# Patient Record
Sex: Female | Born: 1965 | Race: Black or African American | Hispanic: No | Smoking: Current some day smoker
Health system: Southern US, Community
[De-identification: ages and names within clinical notes are randomized; demographics above are authoritative.]

## PROBLEM LIST (undated history)

## (undated) ENCOUNTER — Ambulatory Visit: Payer: Self-pay

## (undated) DIAGNOSIS — Z9289 Personal history of other medical treatment: Secondary | ICD-10-CM

## (undated) DIAGNOSIS — N938 Other specified abnormal uterine and vaginal bleeding: Secondary | ICD-10-CM

## (undated) DIAGNOSIS — I10 Essential (primary) hypertension: Secondary | ICD-10-CM

## (undated) DIAGNOSIS — K219 Gastro-esophageal reflux disease without esophagitis: Secondary | ICD-10-CM

## (undated) DIAGNOSIS — N361 Urethral diverticulum: Secondary | ICD-10-CM

## (undated) DIAGNOSIS — B3731 Acute candidiasis of vulva and vagina: Secondary | ICD-10-CM

## (undated) DIAGNOSIS — J189 Pneumonia, unspecified organism: Secondary | ICD-10-CM

## (undated) DIAGNOSIS — B373 Candidiasis of vulva and vagina: Secondary | ICD-10-CM

## (undated) DIAGNOSIS — J9801 Acute bronchospasm: Secondary | ICD-10-CM

## (undated) DIAGNOSIS — N390 Urinary tract infection, site not specified: Secondary | ICD-10-CM

## (undated) HISTORY — PX: URETHRAL DIVERTICULECTOMY: SHX2618

## (undated) HISTORY — DX: Acute candidiasis of vulva and vagina: B37.31

## (undated) HISTORY — DX: Acute bronchospasm: J98.01

## (undated) HISTORY — PX: TUBAL LIGATION: SHX77

## (undated) HISTORY — DX: Urethral diverticulum: N36.1

## (undated) HISTORY — DX: Urinary tract infection, site not specified: N39.0

## (undated) HISTORY — PX: LASER ABLATION: SHX1947

## (undated) HISTORY — DX: Candidiasis of vulva and vagina: B37.3

## (undated) HISTORY — DX: Other specified abnormal uterine and vaginal bleeding: N93.8

---

## 2000-08-30 ENCOUNTER — Encounter: Admission: RE | Admit: 2000-08-30 | Discharge: 2000-08-30 | Payer: Self-pay | Admitting: Occupational Medicine

## 2000-08-30 ENCOUNTER — Encounter: Payer: Self-pay | Admitting: Occupational Medicine

## 2001-06-12 ENCOUNTER — Encounter: Payer: Self-pay | Admitting: Internal Medicine

## 2001-06-12 ENCOUNTER — Encounter: Admission: RE | Admit: 2001-06-12 | Discharge: 2001-06-12 | Payer: Self-pay | Admitting: Internal Medicine

## 2001-10-14 ENCOUNTER — Encounter: Payer: Self-pay | Admitting: Pulmonary Disease

## 2002-07-27 ENCOUNTER — Emergency Department (HOSPITAL_COMMUNITY): Admission: EM | Admit: 2002-07-27 | Discharge: 2002-07-28 | Payer: Self-pay | Admitting: Emergency Medicine

## 2002-07-28 ENCOUNTER — Encounter: Payer: Self-pay | Admitting: Emergency Medicine

## 2004-06-20 ENCOUNTER — Ambulatory Visit: Payer: Self-pay | Admitting: Internal Medicine

## 2005-02-27 ENCOUNTER — Ambulatory Visit: Payer: Self-pay | Admitting: Internal Medicine

## 2005-03-23 ENCOUNTER — Ambulatory Visit: Payer: Self-pay | Admitting: Internal Medicine

## 2005-03-27 ENCOUNTER — Ambulatory Visit: Payer: Self-pay | Admitting: Internal Medicine

## 2005-03-31 ENCOUNTER — Ambulatory Visit: Payer: Self-pay | Admitting: Internal Medicine

## 2005-04-28 ENCOUNTER — Other Ambulatory Visit: Admission: RE | Admit: 2005-04-28 | Discharge: 2005-04-28 | Payer: Self-pay | Admitting: Obstetrics & Gynecology

## 2005-05-12 ENCOUNTER — Emergency Department (HOSPITAL_COMMUNITY): Admission: EM | Admit: 2005-05-12 | Discharge: 2005-05-12 | Payer: Self-pay | Admitting: Family Medicine

## 2006-04-18 ENCOUNTER — Emergency Department (HOSPITAL_COMMUNITY): Admission: EM | Admit: 2006-04-18 | Discharge: 2006-04-18 | Payer: Self-pay | Admitting: Family Medicine

## 2006-07-12 ENCOUNTER — Ambulatory Visit: Payer: Self-pay | Admitting: Internal Medicine

## 2006-07-12 LAB — CONVERTED CEMR LAB
Bilirubin Urine: NEGATIVE
Hemoglobin, Urine: NEGATIVE
Nitrite: NEGATIVE
RBC / HPF: NONE SEEN (ref <3)
Urine Glucose: NEGATIVE mg/dL
pH: 7 (ref 5.0–8.0)

## 2008-12-14 ENCOUNTER — Emergency Department (HOSPITAL_COMMUNITY): Admission: EM | Admit: 2008-12-14 | Discharge: 2008-12-14 | Payer: Self-pay | Admitting: Emergency Medicine

## 2008-12-16 ENCOUNTER — Emergency Department (HOSPITAL_COMMUNITY): Admission: EM | Admit: 2008-12-16 | Discharge: 2008-12-16 | Payer: Self-pay | Admitting: Family Medicine

## 2009-04-24 DIAGNOSIS — Z9289 Personal history of other medical treatment: Secondary | ICD-10-CM

## 2009-04-24 HISTORY — DX: Personal history of other medical treatment: Z92.89

## 2009-10-12 ENCOUNTER — Encounter: Payer: Self-pay | Admitting: Internal Medicine

## 2009-10-12 ENCOUNTER — Inpatient Hospital Stay (HOSPITAL_COMMUNITY): Admission: EM | Admit: 2009-10-12 | Discharge: 2009-10-13 | Payer: Self-pay | Admitting: Emergency Medicine

## 2009-10-12 ENCOUNTER — Ambulatory Visit: Payer: Self-pay | Admitting: Internal Medicine

## 2009-10-13 ENCOUNTER — Encounter: Payer: Self-pay | Admitting: Internal Medicine

## 2009-10-13 DIAGNOSIS — I10 Essential (primary) hypertension: Secondary | ICD-10-CM

## 2009-10-13 DIAGNOSIS — N390 Urinary tract infection, site not specified: Secondary | ICD-10-CM

## 2009-10-13 DIAGNOSIS — N949 Unspecified condition associated with female genital organs and menstrual cycle: Secondary | ICD-10-CM | POA: Insufficient documentation

## 2009-10-13 DIAGNOSIS — D259 Leiomyoma of uterus, unspecified: Secondary | ICD-10-CM | POA: Insufficient documentation

## 2009-10-13 DIAGNOSIS — D509 Iron deficiency anemia, unspecified: Secondary | ICD-10-CM | POA: Insufficient documentation

## 2009-10-13 HISTORY — DX: Urinary tract infection, site not specified: N39.0

## 2009-10-26 ENCOUNTER — Ambulatory Visit (HOSPITAL_COMMUNITY): Admission: RE | Admit: 2009-10-26 | Discharge: 2009-10-26 | Payer: Self-pay | Admitting: Urology

## 2009-10-27 ENCOUNTER — Ambulatory Visit: Payer: Self-pay | Admitting: Obstetrics and Gynecology

## 2009-11-26 ENCOUNTER — Other Ambulatory Visit: Admission: RE | Admit: 2009-11-26 | Discharge: 2009-11-26 | Payer: Self-pay | Admitting: Obstetrics and Gynecology

## 2009-11-26 ENCOUNTER — Ambulatory Visit: Payer: Self-pay | Admitting: Obstetrics and Gynecology

## 2010-01-10 ENCOUNTER — Ambulatory Visit: Payer: Self-pay | Admitting: Obstetrics & Gynecology

## 2010-01-10 ENCOUNTER — Inpatient Hospital Stay (HOSPITAL_COMMUNITY): Admission: AD | Admit: 2010-01-10 | Discharge: 2010-01-11 | Payer: Self-pay | Admitting: Obstetrics & Gynecology

## 2010-03-01 ENCOUNTER — Ambulatory Visit: Payer: Self-pay | Admitting: Obstetrics & Gynecology

## 2010-03-01 ENCOUNTER — Observation Stay (HOSPITAL_COMMUNITY): Admission: AD | Admit: 2010-03-01 | Discharge: 2010-03-02 | Payer: Self-pay | Admitting: Obstetrics & Gynecology

## 2010-03-07 ENCOUNTER — Ambulatory Visit: Payer: Self-pay | Admitting: Obstetrics and Gynecology

## 2010-04-26 ENCOUNTER — Ambulatory Visit (HOSPITAL_COMMUNITY)
Admission: RE | Admit: 2010-04-26 | Discharge: 2010-04-26 | Payer: Self-pay | Source: Home / Self Care | Attending: Obstetrics & Gynecology | Admitting: Obstetrics & Gynecology

## 2010-05-11 ENCOUNTER — Ambulatory Visit: Admit: 2010-05-11 | Payer: Self-pay | Admitting: Obstetrics & Gynecology

## 2010-05-16 NOTE — Op Note (Addendum)
  NAME:  [name], Kristin NO.:  [id number]  MEDICAL RECORD NO.:  [id number]          PATIENT TYPE:  AMB  LOCATION:  SDC                           FACILITY:  [facility]  PHYSICIAN:  Scheryl Darter, MD       DATE OF BIRTH:  12/31/1965  DATE OF PROCEDURE:  04/26/2010 DATE OF DISCHARGE:                              OPERATIVE REPORT   PROCEDURES: 1. Hysteroscopy curettage. 2. Endometrial ablation with NovaSure.  PREOPERATIVE DIAGNOSES: 1. Menorrhagia. 2. Submucous fibroid.  POSTOPERATIVE DIAGNOSES: 1. Menorrhagia. 2. Endometrial polyps.  SURGEON:  Scheryl Darter, MD  ANESTHESIA:  General plus local.  ESTIMATED BLOOD LOSS:  Minimal.  SPECIMEN:  Endometrial curettings.  COMPLICATIONS:  None.  DRAINS:  None.  COUNTS:  Correct.  OPERATIVE COURSE:  The patient gave written consent for hysteroscopy, possible resection of submucous fibroid, and endometrial ablation.  The patient identification was confirmed.  She was brought to the OR and general anesthesia was induced.  She was placed in dorsal lithotomy position.  Exam revealed appeared to be normal size uterus.  No adnexal masses.  Perineum and vagina were sterilely prepped and draped and bladder was drained with a red rubber catheter.  Speculum was inserted. A 0.5% Marcaine was infiltrated for intracervical block using 14 mL of anesthetic.  Cervix was grasped with single-tooth tenaculum.  Cervix was dilated enough to pass the diagnostic hysteroscope.  Endometrial cavity measured 10 cm.  Both tubal ostia were seen.  Endometrial polyps were identified but no fibroid and was seen.  Elected to proceed with curettage in order to obtain tissue specimen.  A 1.5% glycine was used during the hysteroscopy.  Endometrial curettings were obtained and sent to Pathology.  Then, proceeded with the endometrial ablation with NovaSure.  The cervical length was 5 cm giving a cavity length of 5 cm. The device was introduced and the  cavity width was 3.9 cm.  After the cavity assessment was passed, normal treatment cycle lasting 1 minute 15 seconds was performed without complications. Instruments were removed.  There was no bleeding at the end of procedure.  Estimated blood loss was negligible.  The patient tolerated the procedure well without complications.  She was brought in stable condition to the recovery room.     Scheryl Darter, MD     JA/MEDQ  D:  04/26/2010  T:  04/26/2010  Job:  [id number]  Electronically Signed by Scheryl Darter MD on 05/16/2010 11:44:16 AM

## 2010-05-26 NOTE — Discharge Summary (Signed)
Summary: Hospital Discharge Update    Hospital Discharge Update:  Date of Admission: 10/12/2009 Date of Discharge: 10/13/2009  Brief Summary:  Kristin Burnett presented after 2 weeks of extensive vaginal bleeding.  US showed fibroid.  Hb was 5.0 with MCV or 57.  She received 3 units of blood, with appropriate rise in Hb to 8.9.  She was given 3 doses of IV Premarin and was evaluated by Dr. Algie Coffer.  On exam she was found to have a urethral abscess.  She was stabilized and discharged on 1 month of megace with follow up with Dr. Algie Coffer.  She is to see urology regarding urethral abscess.  Lab or other results pending at discharge:  Hemoglobin electrophoresis  Other follow-up issues:  She is to followup with Urology regarding abscess.  Problem list changes:  Added new problem of LEIOMYOMA OF UTERUS, UNSPECIFIED (ICD-218.9) - Signed Added new problem of DYSFUNCTIONAL UTERINE BLEEDING (ICD-626.8) - Signed Added new problem of ANEMIA, HYPOCHROMIC (ICD-280.9) - Signed Added new problem of URETHRAL ABSCESS (ICD-597.0) - Signed Added new problem of ESSENTIAL HYPERTENSION, BENIGN (ICD-401.1) - Signed  Medication list changes:  Added new medication of KEFLEX 500 MG CAPS (CEPHALEXIN) Take one capsule by mouth four times a day - Signed Added new medication of MEGESTROL ACETATE 40 MG TABS (MEGESTROL ACETATE) Take 1 tablet by mouth two times a day - Signed Added new medication of OXYCODONE-ACETAMINOPHEN 5-325 MG TABS (OXYCODONE-ACETAMINOPHEN) Take 1 tab by mouth every 4-6 hours as needed for pain. - Signed Rx of KEFLEX 500 MG CAPS (CEPHALEXIN) Take one capsule by mouth four times a day;  #32 x 0;  Signed;  Entered by: Deatra Robinson MD;  Authorized by: Deatra Robinson MD;  Method used: Print then Give to Patient Rx of MEGESTROL ACETATE 40 MG TABS (MEGESTROL ACETATE) Take 1 tablet by mouth two times a day;  #60 x 0;  Signed;  Entered by: Deatra Robinson MD;  Authorized by: Deatra Robinson MD;  Method used:  Print then Give to Patient Rx of OXYCODONE-ACETAMINOPHEN 5-325 MG TABS (OXYCODONE-ACETAMINOPHEN) Take 1 tab by mouth every 4-6 hours as needed for pain.;  #60 x 0;  Signed;  Entered by: Deatra Robinson MD;  Authorized by: Deatra Robinson MD;  Method used: Print then Give to Patient  Discharge medications:  HYDROCHLOROTHIAZIDE 25 MG  TABS (HYDROCHLOROTHIAZIDE) TAKE ONE TABLET DAILY KEFLEX 500 MG CAPS (CEPHALEXIN) Take one capsule by mouth four times a day MEGESTROL ACETATE 40 MG TABS (MEGESTROL ACETATE) Take 1 tablet by mouth two times a day OXYCODONE-ACETAMINOPHEN 5-325 MG TABS (OXYCODONE-ACETAMINOPHEN) Take 1 tab by mouth every 4-6 hours as needed for pain.  Other patient instructions:  Please follow-up with Dr. Charlyn Minerva (Urology) at his clinic on the 2nd floor of [facility] on 10/15/09 at 12:30 pm [phone]). Please keep your follow-up appointment with Dr. Ernestina Penna at [facility] on 10/27/09 at 3:15 pm [phone]) Please keep your follow-up appointment with Dr. [name] in the [facility] on 11/09/09 at 1:30 pm ([phone]) Please take all medications as prescribed.  New medications include Megestrol two times a day and Keflex four times a day.   Please return to the doctor if you have return of extensive vaginal bleeding, dizziness, fainting, chest pain, shortness of breath, fevers or chills.    Note: Hospital Discharge Medications & Other Instructions handout was printed, one copy for patient and a second copy to be placed in hospital chart.

## 2010-07-04 LAB — CBC
MCHC: 29.6 g/dL — ABNORMAL LOW (ref 30.0–36.0)
Platelets: 279 10*3/uL (ref 150–400)
RBC: 4.56 MIL/uL (ref 3.87–5.11)
WBC: 10.4 10*3/uL (ref 4.0–10.5)

## 2010-07-04 LAB — BASIC METABOLIC PANEL
CO2: 24 mEq/L (ref 19–32)
Chloride: 104 mEq/L (ref 96–112)
GFR calc non Af Amer: 52 mL/min — ABNORMAL LOW (ref >60)
Potassium: 3.2 mEq/L — ABNORMAL LOW (ref 3.5–5.1)
Sodium: 134 mEq/L — ABNORMAL LOW (ref 135–145)

## 2010-07-04 LAB — HCG, SERUM, QUALITATIVE: Preg, Serum: NEGATIVE

## 2010-07-05 LAB — CROSSMATCH
Antibody Screen: POSITIVE
Unit division: 0

## 2010-07-05 LAB — CBC
HCT: 18.2 % — ABNORMAL LOW (ref 36.0–46.0)
Hemoglobin: 5.6 g/dL — CL (ref 12.0–15.0)
MCH: 25.7 pg — ABNORMAL LOW (ref 26.0–34.0)
MCV: 68.8 fL — ABNORMAL LOW (ref 78.0–100.0)
MCV: 78.8 fL (ref 78.0–100.0)
RBC: 2.65 MIL/uL — ABNORMAL LOW (ref 3.87–5.11)
RBC: 3.49 MIL/uL — ABNORMAL LOW (ref 3.87–5.11)
RDW: 21.3 % — ABNORMAL HIGH (ref 11.5–15.5)
RDW: 23.3 % — ABNORMAL HIGH (ref 11.5–15.5)
WBC: 15.4 10*3/uL — ABNORMAL HIGH (ref 4.0–10.5)

## 2010-07-05 LAB — URINE MICROSCOPIC-ADD ON: WBC, UA: NONE SEEN WBC/hpf (ref <3)

## 2010-07-05 LAB — URINALYSIS, ROUTINE W REFLEX MICROSCOPIC
Bilirubin Urine: NEGATIVE
Glucose, UA: NEGATIVE mg/dL
Nitrite: NEGATIVE
Protein, ur: NEGATIVE mg/dL
Urobilinogen, UA: 0.2 mg/dL (ref 0.0–1.0)

## 2010-07-05 LAB — COMPREHENSIVE METABOLIC PANEL
AST: 13 U/L (ref 0–37)
CO2: 24 mEq/L (ref 19–32)
Calcium: 9 mg/dL (ref 8.4–10.5)
Chloride: 110 mEq/L (ref 96–112)
Creatinine, Ser: 1.2 mg/dL (ref 0.4–1.2)
GFR calc non Af Amer: 49 mL/min — ABNORMAL LOW (ref >60)
Glucose, Bld: 103 mg/dL — ABNORMAL HIGH (ref 70–99)
Total Bilirubin: 0.7 mg/dL (ref 0.3–1.2)

## 2010-07-07 LAB — COMPREHENSIVE METABOLIC PANEL
ALT: 14 U/L (ref 0–35)
AST: 11 U/L (ref 0–37)
Chloride: 107 mEq/L (ref 96–112)
Creatinine, Ser: 1.25 mg/dL — ABNORMAL HIGH (ref 0.4–1.2)
GFR calc non Af Amer: 47 mL/min — ABNORMAL LOW (ref >60)
Sodium: 136 mEq/L (ref 135–145)
Total Bilirubin: 0.5 mg/dL (ref 0.3–1.2)

## 2010-07-07 LAB — GC/CHLAMYDIA PROBE AMP, GENITAL
Chlamydia, DNA Probe: NEGATIVE
GC Probe Amp, Genital: NEGATIVE

## 2010-07-07 LAB — RAPID URINE DRUG SCREEN, HOSP PERFORMED
Amphetamines: NOT DETECTED
Benzodiazepines: NOT DETECTED
Opiates: POSITIVE — AB

## 2010-07-07 LAB — URINE MICROSCOPIC-ADD ON

## 2010-07-07 LAB — WET PREP, GENITAL
Trich, Wet Prep: NONE SEEN
Yeast Wet Prep HPF POC: NONE SEEN

## 2010-07-07 LAB — DIFFERENTIAL
Basophils Absolute: 0.1 10*3/uL (ref 0.0–0.1)
Eosinophils Relative: 1 % (ref 0–5)
Lymphocytes Relative: 9 % — ABNORMAL LOW (ref 12–46)
Lymphs Abs: 1.4 10*3/uL (ref 0.7–4.0)
Neutrophils Relative %: 88 % — ABNORMAL HIGH (ref 43–77)

## 2010-07-07 LAB — URINALYSIS, ROUTINE W REFLEX MICROSCOPIC
Glucose, UA: NEGATIVE mg/dL
Protein, ur: 100 mg/dL — AB
Specific Gravity, Urine: >1.03 — ABNORMAL HIGH (ref 1.005–1.030)
Urobilinogen, UA: 0.2 mg/dL (ref 0.0–1.0)
pH: 6 (ref 5.0–8.0)

## 2010-07-07 LAB — CBC
MCHC: 32.5 g/dL (ref 30.0–36.0)
Platelets: 309 10*3/uL (ref 150–400)
RBC: 3.71 MIL/uL — ABNORMAL LOW (ref 3.87–5.11)
WBC: 15.8 10*3/uL — ABNORMAL HIGH (ref 4.0–10.5)

## 2010-07-07 LAB — SAMPLE TO BLOOD BANK

## 2010-07-10 LAB — CBC
HCT: 16.6 % — ABNORMAL LOW (ref 36.0–46.0)
Hemoglobin: 5 g/dL — CL (ref 12.0–15.0)
MCHC: 29.8 g/dL — ABNORMAL LOW (ref 30.0–36.0)
MCHC: 32.3 g/dL (ref 30.0–36.0)
MCV: 57.7 fL — ABNORMAL LOW (ref 78.0–100.0)
Platelets: 348 10*3/uL (ref 150–400)
RBC: 2.89 MIL/uL — ABNORMAL LOW (ref 3.87–5.11)
RBC: 4.01 MIL/uL (ref 3.87–5.11)
RDW: 23.6 % — ABNORMAL HIGH (ref 11.5–15.5)
WBC: 12.8 10*3/uL — ABNORMAL HIGH (ref 4.0–10.5)
WBC: 15.8 10*3/uL — ABNORMAL HIGH (ref 4.0–10.5)

## 2010-07-10 LAB — URINALYSIS, ROUTINE W REFLEX MICROSCOPIC
Bilirubin Urine: NEGATIVE
Glucose, UA: NEGATIVE mg/dL
Ketones, ur: NEGATIVE mg/dL
Specific Gravity, Urine: 1.029 (ref 1.005–1.030)

## 2010-07-10 LAB — THROMBIN TIME: Thrombin Time: 18 (ref 16–23)

## 2010-07-10 LAB — BASIC METABOLIC PANEL
BUN: 15 mg/dL (ref 6–23)
CO2: 21 mEq/L (ref 19–32)
CO2: 23 mEq/L (ref 19–32)
Calcium: 8.3 mg/dL — ABNORMAL LOW (ref 8.4–10.5)
Calcium: 9 mg/dL (ref 8.4–10.5)
Chloride: 107 mEq/L (ref 96–112)
Creatinine, Ser: 1.6 mg/dL — ABNORMAL HIGH (ref 0.4–1.2)
Creatinine, Ser: 1.61 mg/dL — ABNORMAL HIGH (ref 0.4–1.2)
GFR calc Af Amer: 43 mL/min — ABNORMAL LOW (ref >60)
GFR calc Af Amer: 43 mL/min — ABNORMAL LOW (ref >60)
GFR calc non Af Amer: 35 mL/min — ABNORMAL LOW (ref >60)
GFR calc non Af Amer: 36 mL/min — ABNORMAL LOW (ref >60)
Glucose, Bld: 95 mg/dL (ref 70–99)
Potassium: 3.9 mEq/L (ref 3.5–5.1)
Sodium: 135 mEq/L (ref 135–145)

## 2010-07-10 LAB — DIFFERENTIAL
Basophils Absolute: 0 10*3/uL (ref 0.0–0.1)
Basophils Relative: 0 % (ref 0–1)
Eosinophils Absolute: 0.1 10*3/uL (ref 0.0–0.7)
Eosinophils Relative: 1 % (ref 0–5)
Lymphocytes Relative: 18 % (ref 12–46)
Lymphs Abs: 2.3 10*3/uL (ref 0.7–4.0)
Monocytes Absolute: 0.9 10*3/uL (ref 0.1–1.0)
Monocytes Relative: 7 % (ref 3–12)
Neutro Abs: 9.5 10*3/uL — ABNORMAL HIGH (ref 1.7–7.7)
Neutrophils Relative %: 74 % (ref 43–77)

## 2010-07-10 LAB — TYPE AND SCREEN
ABO/RH(D): B POS
Antibody Screen: NEGATIVE

## 2010-07-10 LAB — HEMOGLOBINOPATHY EVALUATION
Hemoglobin Other: 0 % (ref 0.0–0.0)
Hgb A2 Quant: 2.5 % (ref 2.2–3.2)
Hgb F Quant: 0.3 % (ref 0.0–2.0)
Hgb S Quant: 0 % (ref 0.0–0.0)

## 2010-07-10 LAB — PLATELET FUNCTION ASSAY: Collagen / Epinephrine: 151 seconds (ref 0–184)

## 2010-07-10 LAB — FOLATE: Folate: 6.2 ng/mL

## 2010-07-10 LAB — TSH: TSH: 0.789 u[IU]/mL (ref 0.350–4.500)

## 2010-07-10 LAB — URINE CULTURE: Colony Count: >=100000

## 2010-07-10 LAB — VITAMIN B12: Vitamin B-12: 756 pg/mL (ref 211–911)

## 2010-07-10 LAB — RETICULOCYTES: Retic Count, Absolute: 36.6 10*3/uL (ref 19.0–186.0)

## 2010-07-10 LAB — URINE MICROSCOPIC-ADD ON

## 2010-07-10 LAB — POCT PREGNANCY, URINE: Preg Test, Ur: NEGATIVE

## 2011-04-03 ENCOUNTER — Encounter: Payer: Self-pay | Admitting: Emergency Medicine

## 2011-04-03 ENCOUNTER — Emergency Department (HOSPITAL_COMMUNITY): Payer: BC Managed Care – PPO

## 2011-04-03 ENCOUNTER — Emergency Department (HOSPITAL_COMMUNITY)
Admission: EM | Admit: 2011-04-03 | Discharge: 2011-04-04 | Disposition: A | Discharge status: Home / Self Care | Payer: BC Managed Care – PPO | Attending: Emergency Medicine | Admitting: Emergency Medicine

## 2011-04-03 DIAGNOSIS — I10 Essential (primary) hypertension: Secondary | ICD-10-CM | POA: Insufficient documentation

## 2011-04-03 DIAGNOSIS — Z79899 Other long term (current) drug therapy: Secondary | ICD-10-CM | POA: Insufficient documentation

## 2011-04-03 DIAGNOSIS — S93401A Sprain of unspecified ligament of right ankle, initial encounter: Secondary | ICD-10-CM

## 2011-04-03 DIAGNOSIS — M25476 Effusion, unspecified foot: Secondary | ICD-10-CM | POA: Insufficient documentation

## 2011-04-03 DIAGNOSIS — S060X0A Concussion without loss of consciousness, initial encounter: Secondary | ICD-10-CM | POA: Insufficient documentation

## 2011-04-03 DIAGNOSIS — M25579 Pain in unspecified ankle and joints of unspecified foot: Secondary | ICD-10-CM | POA: Insufficient documentation

## 2011-04-03 DIAGNOSIS — M25473 Effusion, unspecified ankle: Secondary | ICD-10-CM | POA: Insufficient documentation

## 2011-04-03 DIAGNOSIS — S8990XA Unspecified injury of unspecified lower leg, initial encounter: Secondary | ICD-10-CM | POA: Insufficient documentation

## 2011-04-03 HISTORY — DX: Essential (primary) hypertension: I10

## 2011-04-03 NOTE — ED Notes (Signed)
PT. TWISTED RIGHT ANKLE AFTER STEPPING ON UNEVEN GROUND TODAY .

## 2011-04-04 MED ORDER — NAPROXEN 500 MG PO TABS: 500.0000 mg | ORAL_TABLET | Freq: Two times a day (BID) | ORAL | Status: DC

## 2011-04-04 MED ORDER — KETOROLAC TROMETHAMINE 60 MG/2ML IM SOLN
60.0000 mg | Freq: Once | INTRAMUSCULAR | Status: AC
  Administered 2011-04-04: 60 mg via INTRAMUSCULAR
  Filled 2011-04-04: qty 2

## 2011-04-04 NOTE — ED Notes (Signed)
Patient presents stating that earlier on her last stop with her bus, she over tread her right ankle.  Right ankle swollen +pulses  Moves toes

## 2011-04-04 NOTE — ED Provider Notes (Signed)
History     CSN: [id number] Arrival date & time: 04/03/2011  8:53 PM   First MD Initiated Contact with Patient 04/04/11 0051      Chief Complaint  Patient presents with  . Ankle Injury    (Consider location/radiation/quality/duration/timing/severity/associated sxs/prior treatment) HPI Comments: Patient is a 45 year old female who is employed as a city [employer]. She states that while she was walking off of the bus, down the steps she stepped onto uneven ground, turning her right ankle. This was acute in onset at 2:45 PM, she has had constant mild pain which is worse with ambulation and associated with swelling. She has used an ice pack and some over-the-counter pain medication without relief at home.  Patient is a 45 y.o. female presenting with lower extremity injury. The history is provided by the patient and a relative.  Ankle Injury    Past Medical History  Diagnosis Date  . Hypertension   . Anemia     Past Surgical History  Procedure Date  . Tubal ligation     No family history on file.  History  Substance Use Topics  . Smoking status: Current Everyday Smoker  . Smokeless tobacco: Not on file  . Alcohol Use: Yes    OB History    Grav Para Term Preterm Abortions TAB SAB Ect Mult Living                  Review of Systems  Gastrointestinal: Negative for nausea and vomiting.  Musculoskeletal: Positive for joint swelling.  Neurological: Negative for weakness and numbness.    Allergies  Review of patient's allergies indicates no known allergies.  Home Medications   Current Outpatient Rx  Name Route Sig Dispense Refill  . HYDROCHLOROTHIAZIDE 25 MG PO TABS Oral Take 25 mg by mouth daily.      Marland Kitchen NAPROXEN 500 MG PO TABS Oral Take 1 tablet (500 mg total) by mouth 2 (two) times daily with a meal. 30 tablet 0    BP 182/106  Pulse 69  Temp(Src) 98.5 F (36.9 C) (Oral)  Resp 20  SpO2 97%  LMP 03/13/2011  Physical Exam  Nursing note and vitals  reviewed. Constitutional: She appears well-developed and well-nourished. No distress.  HENT:  Head: Normocephalic and atraumatic.  Eyes: Conjunctivae are normal. No scleral icterus.  Cardiovascular: Normal rate, regular rhythm and intact distal pulses.   Pulmonary/Chest: Effort normal and breath sounds normal.  Musculoskeletal: She exhibits tenderness ( Right ankle tenderness laterally inferior to the lateral malleolus. No tenderness over the base of the fifth metatarsal, no medial ankle tenderness. Decreased range of motion secondary to pain). She exhibits no edema.        Normal pulses of the foot on the right  Neurological: She is alert.  Skin: Skin is warm and dry. No rash noted. She is not diaphoretic.    ED Course  Procedures (including critical care time)  Labs Reviewed - No data to display Dg Ankle Complete Right  04/03/2011  *RADIOLOGY REPORT*  Clinical Data: Right ankle pain and swelling after fall.  RIGHT ANKLE - COMPLETE 3+ VIEW  Comparison: None.  Findings: Diffuse soft tissue swelling.  The ankle mortis and talar dome appear intact. No evidence of acute fracture or subluxation. No focal bone lesions.  Bone matrix and cortex appear intact.  No abnormal radiopaque densities in the soft tissues.  Plantar calcaneal spur.  IMPRESSION: Soft tissue swelling.  No acute bony abnormalities suggested.  Original Report Authenticated By:  Marlon Pel, M.D.     1. Sprain of right ankle       MDM  X-rays reviewed with patient showing no signs of bony fracture. Patient does have some hypertension and has been counseled to take her medications including hydrochlorothiazide. Anti-inflammatories given including intramuscular Toradol in home with Naprosyn. Rice therapy initiated with Ace wrap, ice, elevation, anti-inflammatories.        Vida Roller, MD 04/04/11 [id number]

## 2011-11-22 ENCOUNTER — Encounter (HOSPITAL_COMMUNITY): Payer: Self-pay | Admitting: *Deleted

## 2011-11-22 ENCOUNTER — Inpatient Hospital Stay (HOSPITAL_COMMUNITY)
Admission: AD | Admit: 2011-11-22 | Discharge: 2011-11-22 | Disposition: A | Discharge status: Home / Self Care | Payer: BC Managed Care – PPO | Source: Ambulatory Visit | Attending: Emergency Medicine | Admitting: Emergency Medicine

## 2011-11-22 ENCOUNTER — Encounter: Payer: Self-pay | Admitting: *Deleted

## 2011-11-22 DIAGNOSIS — I10 Essential (primary) hypertension: Secondary | ICD-10-CM | POA: Insufficient documentation

## 2011-11-22 DIAGNOSIS — N949 Unspecified condition associated with female genital organs and menstrual cycle: Secondary | ICD-10-CM | POA: Insufficient documentation

## 2011-11-22 DIAGNOSIS — K5900 Constipation, unspecified: Secondary | ICD-10-CM | POA: Insufficient documentation

## 2011-11-22 DIAGNOSIS — N938 Other specified abnormal uterine and vaginal bleeding: Secondary | ICD-10-CM | POA: Insufficient documentation

## 2011-11-22 DIAGNOSIS — N939 Abnormal uterine and vaginal bleeding, unspecified: Secondary | ICD-10-CM

## 2011-11-22 DIAGNOSIS — R109 Unspecified abdominal pain: Secondary | ICD-10-CM | POA: Insufficient documentation

## 2011-11-22 LAB — URINALYSIS, ROUTINE W REFLEX MICROSCOPIC
Glucose, UA: NEGATIVE mg/dL
Protein, ur: 100 mg/dL — AB
pH: 5 (ref 5.0–8.0)

## 2011-11-22 LAB — CBC
HCT: 40.7 % (ref 36.0–46.0)
Platelets: 247 10*3/uL (ref 150–400)
RDW: 14.3 % (ref 11.5–15.5)
WBC: 13.6 10*3/uL — ABNORMAL HIGH (ref 4.0–10.5)

## 2011-11-22 LAB — COMPREHENSIVE METABOLIC PANEL
ALT: 11 U/L (ref 0–35)
AST: 10 U/L (ref 0–37)
Albumin: 3.6 g/dL (ref 3.5–5.2)
Alkaline Phosphatase: 115 U/L (ref 39–117)
BUN: 11 mg/dL (ref 6–23)
Chloride: 100 mEq/L (ref 96–112)
Potassium: 3.4 mEq/L — ABNORMAL LOW (ref 3.5–5.1)
Total Bilirubin: 0.4 mg/dL (ref 0.3–1.2)

## 2011-11-22 LAB — URINE MICROSCOPIC-ADD ON

## 2011-11-22 MED ORDER — IRBESARTAN 150 MG PO TABS
300.0000 mg | ORAL_TABLET | Freq: Once | ORAL | Status: AC
  Administered 2011-11-22: 300 mg via ORAL
  Filled 2011-11-22: qty 2

## 2011-11-22 MED ORDER — SENNA-DOCUSATE SODIUM 8.6-50 MG PO TABS: 1.0000 | ORAL_TABLET | Freq: Every day | ORAL | Status: DC

## 2011-11-22 NOTE — ED Provider Notes (Signed)
History     CSN: [id number]  Arrival date & time 11/22/11  0745   None     Chief Complaint  Patient presents with  . Vaginal Bleeding  . Abdominal Pain    (Consider location/radiation/quality/duration/timing/severity/associated sxs/prior treatment) The history is provided by the patient.    46 y/o female INAD c/o lower abdominal pain (non-acute onset 10/10 yesterday Now 3/10) constipation, and vaginal bleeding with passage of large clots onset at 5 AM today. Pt vomited 1x NBNB. Pt has used 1 pad in 3 hours. Had  Fibroid ablation in 1.5 years ago. Denies weakness or lightheaded sensation.   [facility], Pt has HTN and has not had her Rx in 3 days because of her work schedule. Pt denies CP, palpitations, SOB, weakness  Past Medical History  Diagnosis Date  . Hypertension   . Anemia     Past Surgical History  Procedure Date  . Tubal ligation   . Laser ablation     History reviewed. No pertinent family history.  History  Substance Use Topics  . Smoking status: Current Everyday Smoker  . Smokeless tobacco: Not on file  . Alcohol Use: Yes    OB History    Grav Para Term Preterm Abortions TAB SAB Ect Mult Living   3 3 3       3       Review of Systems  Allergies  Review of patient's allergies indicates no known allergies.  Home Medications   Current Outpatient Rx  Name Route Sig Dispense Refill  . TYLENOL EX ST ARTHRITIS PAIN PO Oral Take by mouth.    . BENICAR PO Oral Take by mouth once. Patient unsure of dosage    . LAXATIVE PILLS PO Oral Take by mouth.    . FLEET PEDIATRIC 3.5-9.5 GM/59ML RE ENEM Rectal Place 1 enema rectally once.      BP 194/101  Pulse 71  Temp 98.3 F (36.8 C) (Oral)  Resp 17  SpO2 99%  LMP 11/14/2011  Physical Exam  Vitals reviewed. Constitutional: She is oriented to person, place, and time. She appears well-developed and well-nourished. No distress.  HENT:  Head: Normocephalic.  Eyes:  Conjunctivae and EOM are normal.       Conjunctiva ruddy  Cardiovascular: Normal rate.   Pulmonary/Chest: Effort normal.  Abdominal: Soft. Bowel sounds are normal. She exhibits no distension and no mass. There is tenderness. There is no rebound and no guarding.       Suprapubic tenderness  Musculoskeletal: Normal range of motion.  Neurological: She is alert and oriented to person, place, and time.  Psychiatric: She has a normal mood and affect.    ED Course  Procedures (including critical care time)  Labs Reviewed  CBC - Abnormal; Notable for the following:    WBC 13.6 (*)     All other components within normal limits  COMPREHENSIVE METABOLIC PANEL - Abnormal; Notable for the following:    Potassium 3.4 (*)     Glucose, Bld 126 (*)     Creatinine, Ser 1.20 (*)     GFR calc non Af Amer 55 (*)     GFR calc Af Amer 63 (*)     All other components within normal limits  POCT PREGNANCY, URINE  URINALYSIS, ROUTINE W REFLEX MICROSCOPIC   No results found.   1. Constipation   2. Hypertension   3. Abdominal pain   4. Abnormal vaginal bleeding  MDM  BP is currently 170's/90's and pt is asymptomatic. Pt was given irbesartan before transport to ED. vaginal bleeding is not excessive at one pad every 3 hours. Vital signs are stable. Urine pregnancy test is negative.  Nurse midwife Horton Chin will set up an appointment for her at [facility]. Patient's pain is resolved and she is ready for DC. Cautions given.  Pt verbalized understanding and agrees with care plan. Outpatient follow-up and return precautions given.         Wynetta Emery, PA-C 11/22/11 1013

## 2011-11-22 NOTE — ED Notes (Signed)
PA at bedside.

## 2011-11-22 NOTE — ED Notes (Signed)
At present pt blood pressure is stable and within normal limits. rn will hold on drawing blood and in and out until PA talks with provider at [facility].

## 2011-11-22 NOTE — ED Notes (Signed)
PA alerted of pts vaginal bleeding

## 2011-11-22 NOTE — ED Notes (Signed)
[id number]<AV> Expected date:<BR> Expected time:<BR> Means of arrival:Ambulance<BR> Comments:<BR> From [facility], [facility], HTN/constipation/abd pain

## 2011-11-22 NOTE — ED Provider Notes (Signed)
Medical screening examination/treatment/procedure(s) were performed by non-physician practitioner and as supervising physician I was immediately available for consultation/collaboration.   Date: 11/22/2011  Rate: 66  Rhythm: normal sinus rhythm  QRS Axis: normal  Intervals: normal  ST/T Wave abnormalities: nonspecific ST/T wave changes  Conduction Disutrbances:none  Narrative Interpretation:   Old EKG Reviewed: unchanged   Flint Melter, MD 11/22/11 1236

## 2011-11-22 NOTE — MAU Note (Signed)
Patient states "feels like my whole inside are about to fall out". No active vaginal bleeding but have passed several clots in the toilet which started this morning. Ablation done in early 2012. Hx of fibroids.

## 2011-11-22 NOTE — MAU Provider Note (Signed)
Charnise Lovan [id number] Chief Complaint  Patient presents with  . Vaginal Bleeding  . Abdominal Pain     First Provider Initiated Contact with Patient 11/22/11 [phone]      SUBJECTIVE HPI:the patient is a 46 year old female who presents to MAU reporting: 1. Low abd pain "feels like my insides are going to fall out" since 0500. Improved w/ Tylenol to 5/10. 2. Passing clots vaginally. Not bleeding otherwise. Has not had any bleeding since ablation last year. 3. Constipation. Small, hard BMs x several days. Took Laxative yesterday. No BM yet.   Know hypertension. Rx Benicar. Not taking regularly. Denies chest pain,. SOB, dizziness, HA, difficulty w/ gait or speech, weakness.   Past Medical History  Diagnosis Date  . Hypertension   . Anemia    Past Surgical History  Procedure Date  . Tubal ligation   . Laser ablation    History   Social History  . Marital Status: Divorced    Spouse Name: N/A    Number of Children: N/A  . Years of Education: N/A   Occupational History  . Not on file.   Social History Main Topics  . Smoking status: Current Everyday Smoker  . Smokeless tobacco: Not on file  . Alcohol Use: Yes  . Drug Use: No  . Sexually Active: Yes   Other Topics Concern  . Not on file   Social History Narrative  . No narrative on file   No current facility-administered medications on file prior to encounter.   Current Outpatient Prescriptions on File Prior to Encounter  Medication Sig Dispense Refill  . Olmesartan Medoxomil (BENICAR PO) Take by mouth once. Patient unsure of dosage      . hydrochlorothiazide (HYDRODIURIL) 25 MG tablet Take 25 mg by mouth daily.        . naproxen (NAPROSYN) 500 MG tablet Take 1 tablet (500 mg total) by mouth 2 (two) times daily with a meal.  30 tablet  0   No Known Allergies  ROS: Pertinent items in HPI  OBJECTIVE Blood pressure 191/118, pulse 72, temperature 97.9 F (36.6 C), temperature source Oral, resp. rate 20, last  menstrual period 11/14/2011. Patient Vitals for the past 24 hrs:  BP Temp Temp src Pulse Resp  11/22/11 0732 225/117 mmHg - - - -  11/22/11 0726 214/117 mmHg 97.8 F (36.6 C) Oral 71  20   11/22/11 0705 191/118 mmHg - - 72  20   11/22/11 0646 227/121 mmHg - - 71  -  11/22/11 0634 227/121 mmHg - - 71  -  11/22/11 0631 - 97.9 F (36.6 C) Oral - 20    GENERAL: Well-developed, well-nourished female in mild distress. No diaphoresis. HEENT: Normocephalic. HEART: normal rate, normal S1 and S2, no M, R or G RESP: normal effort, CTAB ABDOMEN: Soft, mild low abd tenderness, normal BS x 4 EXTREMITIES: Nontender, no edema NEURO: Alert and oriented.  SPECULUM EXAM: deferred. Scant blood on pad.   LAB RESULTS  Results for orders placed during the hospital encounter of 11/22/11 (from the past 24 hour(s))  CBC     Status: Abnormal   Collection Time   11/22/11  6:45 AM      Component Value Range   WBC 13.6 (*) 4.0 - 10.5 K/uL   RBC 4.71  3.87 - 5.11 MIL/uL   Hemoglobin 12.9  12.0 - 15.0 g/dL   HCT 82.9  56.2 - 13.0 %   MCV 86.4  78.0 - 100.0 fL  MCH 27.4  26.0 - 34.0 pg   MCHC 31.7  30.0 - 36.0 g/dL   RDW 16.1  09.6 - 04.5 %   Platelets 247  150 - 400 K/uL  COMPREHENSIVE METABOLIC PANEL     Status: Abnormal   Collection Time   11/22/11  6:45 AM      Component Value Range   Sodium 135  135 - 145 mEq/L   Potassium 3.4 (*) 3.5 - 5.1 mEq/L   Chloride 100  96 - 112 mEq/L   CO2 25  19 - 32 mEq/L   Glucose, Bld 126 (*) 70 - 99 mg/dL   BUN 11  6 - 23 mg/dL   Creatinine, Ser 4.09 (*) 0.50 - 1.10 mg/dL   Calcium 9.1  8.4 - 81.1 mg/dL   Total Protein 7.7  6.0 - 8.3 g/dL   Albumin 3.6  3.5 - 5.2 g/dL   AST 10  0 - 37 U/L   ALT 11  0 - 35 U/L   Alkaline Phosphatase 115  39 - 117 U/L   Total Bilirubin 0.4  0.3 - 1.2 mg/dL   GFR calc non Af Amer 55 (*) >90 mL/min   GFR calc Af Amer 63 (*) >90 mL/min   IMAGING NA  ASSESSMENT  1. Constipation   2. Hypertension   3. Abdominal pain   4.  Abnormal vaginal bleeding     PLAN Transfer to [facility]  Per consult w/ Dr. Erin Fulling for severe HTN. Report given to [facility], receiving physician. Instructed to give dose ov Benicar prior to transfer. Pharmacy substituted Avapro 300mg .   Medication List  As of 11/22/2011  7:20 AM   ASK your doctor about these medications         BENICAR PO   Take by mouth once. Patient unsure of dosage (Taking oval pill. 40 mg per pill identifier)      hydrochlorothiazide 25 MG tablet   Commonly known as: HYDRODIURIL   Take 25 mg by mouth daily.      LAXATIVE PILLS PO   Take by mouth.      naproxen 500 MG tablet   Commonly known as: NAPROSYN   Take 1 tablet (500 mg total) by mouth 2 (two) times daily with a meal.      sodium phosphate Pediatric 3.5-9.5 GM/59ML enema   Place 1 enema rectally once.      TYLENOL EX ST ARTHRITIS PAIN PO   Take by mouth.           HARRIETTE, TOVEY 11/22/2011 7:20 AM

## 2011-11-22 NOTE — ED Notes (Signed)
Pt alert and oriented x4. Respirations even and unlabored, bilateral symmetrical rise and fall of chest. Skin warm and dry. In no acute distress. Denies needs.   

## 2011-11-23 NOTE — MAU Provider Note (Signed)
Attestation of Attending Supervision of Advanced Practitioner (CNM/NP): Evaluation and management procedures were performed by the Advanced Practitioner under my supervision and collaboration.  I have reviewed the Advanced Practitioner's note and chart, and I agree with the management and plan.  HARRAWAY-Prescott, Donalee Gaumond 12:35 PM     

## 2011-11-25 ENCOUNTER — Encounter (HOSPITAL_COMMUNITY): Payer: Self-pay | Admitting: *Deleted

## 2011-11-25 ENCOUNTER — Inpatient Hospital Stay (HOSPITAL_COMMUNITY): Payer: BC Managed Care – PPO

## 2011-11-25 ENCOUNTER — Inpatient Hospital Stay (HOSPITAL_COMMUNITY)
Admission: AD | Admit: 2011-11-25 | Discharge: 2011-11-25 | Disposition: A | Discharge status: Home / Self Care | Payer: BC Managed Care – PPO | Source: Ambulatory Visit | Attending: Obstetrics & Gynecology | Admitting: Obstetrics & Gynecology

## 2011-11-25 DIAGNOSIS — D259 Leiomyoma of uterus, unspecified: Secondary | ICD-10-CM | POA: Insufficient documentation

## 2011-11-25 DIAGNOSIS — I10 Essential (primary) hypertension: Secondary | ICD-10-CM | POA: Insufficient documentation

## 2011-11-25 DIAGNOSIS — R109 Unspecified abdominal pain: Secondary | ICD-10-CM | POA: Insufficient documentation

## 2011-11-25 DIAGNOSIS — N949 Unspecified condition associated with female genital organs and menstrual cycle: Secondary | ICD-10-CM | POA: Insufficient documentation

## 2011-11-25 DIAGNOSIS — N938 Other specified abnormal uterine and vaginal bleeding: Secondary | ICD-10-CM | POA: Insufficient documentation

## 2011-11-25 DIAGNOSIS — K5900 Constipation, unspecified: Secondary | ICD-10-CM | POA: Insufficient documentation

## 2011-11-25 LAB — COMPREHENSIVE METABOLIC PANEL WITH GFR
ALT: 13 U/L (ref 0–35)
AST: 15 U/L (ref 0–37)
Albumin: 3.8 g/dL (ref 3.5–5.2)
Alkaline Phosphatase: 106 U/L (ref 39–117)
BUN: 19 mg/dL (ref 6–23)
CO2: 27 meq/L (ref 19–32)
Calcium: 9.4 mg/dL (ref 8.4–10.5)
Chloride: 99 meq/L (ref 96–112)
Creatinine, Ser: 1.27 mg/dL — ABNORMAL HIGH (ref 0.50–1.10)
GFR calc Af Amer: 59 mL/min — ABNORMAL LOW
GFR calc non Af Amer: 51 mL/min — ABNORMAL LOW
Glucose, Bld: 114 mg/dL — ABNORMAL HIGH (ref 70–99)
Potassium: 3.7 meq/L (ref 3.5–5.1)
Sodium: 136 meq/L (ref 135–145)
Total Bilirubin: 0.3 mg/dL (ref 0.3–1.2)
Total Protein: 7.5 g/dL (ref 6.0–8.3)

## 2011-11-25 LAB — CBC
HCT: 34.6 % — ABNORMAL LOW (ref 36.0–46.0)
Hemoglobin: 11.3 g/dL — ABNORMAL LOW (ref 12.0–15.0)
RBC: 3.99 MIL/uL (ref 3.87–5.11)
WBC: 15.2 10*3/uL — ABNORMAL HIGH (ref 4.0–10.5)

## 2011-11-25 LAB — WET PREP, GENITAL: Yeast Wet Prep HPF POC: NONE SEEN

## 2011-11-25 MED ORDER — DEXTROSE 5 % IV SOLN
1.0000 g | Freq: Once | INTRAVENOUS | Status: AC
  Administered 2011-11-25: 1 g via INTRAVENOUS
  Filled 2011-11-25: qty 10

## 2011-11-25 MED ORDER — MAGNESIUM CITRATE PO SOLN
1.0000 | Freq: Once | ORAL | Status: DC
  Filled 2011-11-25: qty 296

## 2011-11-25 MED ORDER — SULFAMETHOXAZOLE-TRIMETHOPRIM 800-160 MG PO TABS: 1.0000 | ORAL_TABLET | Freq: Two times a day (BID) | ORAL | Status: AC

## 2011-11-25 MED ORDER — LACTATED RINGERS IV BOLUS (SEPSIS)
1000.0000 mL | Freq: Once | INTRAVENOUS | Status: AC
  Administered 2011-11-25: 1000 mL via INTRAVENOUS

## 2011-11-25 MED ORDER — IRBESARTAN 300 MG PO TABS
300.0000 mg | ORAL_TABLET | Freq: Once | ORAL | Status: AC
  Administered 2011-11-25: 300 mg via ORAL
  Filled 2011-11-25: qty 1

## 2011-11-25 MED ORDER — MEDROXYPROGESTERONE ACETATE 10 MG PO TABS: 10.0000 mg | ORAL_TABLET | Freq: Every day | ORAL | Status: DC

## 2011-11-25 MED ORDER — KETOROLAC TROMETHAMINE 60 MG/2ML IM SOLN
60.0000 mg | Freq: Once | INTRAMUSCULAR | Status: AC
  Administered 2011-11-25: 60 mg via INTRAMUSCULAR
  Filled 2011-11-25: qty 2

## 2011-11-25 NOTE — MAU Note (Signed)
Patient was seen in MAU this past week for constipation and vaginal bleeding passing clots, abdominal pain, had ablation about 1 1/2 years ago.

## 2011-11-25 NOTE — MAU Provider Note (Signed)
History     CSN: [id number]  Arrival date & time 11/25/11  1156   None     Chief Complaint  Patient presents with  . Constipation  . Vaginal Bleeding    (Consider location/radiation/quality/duration/timing/severity/associated sxs/prior treatment) HPI Pt is not pregnant and presents with lower abdominal pain with hx of fibroids; pt has had passage of large clots, but not bleeding that heavy.  Pt says she has not had a BM in 10 days.Pt has used suppositories and Fleets Enema without success.  She has passed some hard small stools.  Pt has chronic hypertension and was previously seen here on 11/22/2011 with elevated BP and was sent to [facility] due to hypertension BP~230/137.  Pt had not taken her BP med at that point for 3 days.  Today pt states she had spaghetti this morning and stomach was hurting, so did not take BP med.  When pt got here, pt starting throwing up.  Past Medical History  Diagnosis Date  . Hypertension   . Anemia     Past Surgical History  Procedure Date  . Tubal ligation   . Laser ablation     No family history on file.  History  Substance Use Topics  . Smoking status: Current Everyday Smoker  . Smokeless tobacco: Not on file  . Alcohol Use: Yes    OB History    Grav Para Term Preterm Abortions TAB SAB Ect Mult Living   3 3 3       3       Review of Systems  Genitourinary: Positive for vaginal bleeding. Negative for dysuria, flank pain and vaginal discharge.    Allergies  Review of patient's allergies indicates no known allergies.  Home Medications  No current outpatient prescriptions on file.  BP 259/130  Pulse 69  Temp 98.4 F (36.9 C)  Resp 18  LMP 11/14/2011  Physical Exam  Nursing note and vitals reviewed. Constitutional: She is oriented to person, place, and time. She appears well-developed and well-nourished.  HENT:  Head: Normocephalic.  Eyes: Pupils are equal, round, and reactive to light.  Neck: Normal range of motion. Neck  supple.  Cardiovascular: Normal rate.        hypertensive  Pulmonary/Chest: Effort normal.  Abdominal: Soft. Bowel sounds are normal. She exhibits no distension. There is tenderness. There is no rebound and no guarding.  Genitourinary:       Mod amount bright red blood in vault; cervix clean, parous, tender; bimanual diffusely tender with enlarged irregular uterus difficult to outline.  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  Neurological: She is alert and oriented to person, place, and time.  Skin: Skin is warm and dry.  Psychiatric: She has a normal mood and affect.    ED Course  Procedures (including critical care time) Clinical Data: Vaginal bleeding  TRANSABDOMINAL AND TRANSVAGINAL ULTRASOUND OF PELVIS  Technique: Both transabdominal and transvaginal ultrasound  examinations of the pelvis were performed. Transabdominal  technique was performed for global imaging of the pelvis including  uterus, ovaries, adnexal regions, and pelvic cul-de-sac.  It was necessary to proceed with endovaginal exam following the  transabdominal exam to visualize the endometrium and ovaries.  Comparison: 10/26/2009  Findings:  Uterus: The uterus measures 12.9 x 7.7 x 7.2 cm. Multiple  fibroids are identified. There is a large heterogeneous submucosal  fibroid which arises from the posterior myometrium.  This measures  4.5 x 4.4 x 4.0 9 cm. Two smaller fibroids are noted within the  anterior myometrium. These are intramural location.  Endometrium: The endometrium measures 13.6 mm.  Right ovary: Normal appearance/no adnexal mass  Left ovary: Not visualize.  Other Findings: Trace free fluid noted within the pelvis.  IMPRESSION:  1. Large submucosal fibroid is identified within the mid uterine  segment as before. This may account for the patient's vaginal  bleeding and pain.  Original Report  Authenticated By: Rosealee Albee, M.D.      Results for orders placed during the hospital encounter of 11/25/11 (from the past 24 hour(s))  CBC     Status: Abnormal   Collection Time   11/25/11 12:22 PM      Component Value Range   WBC 15.2 (*) 4.0 - 10.5 K/uL   RBC 3.99  3.87 - 5.11 MIL/uL   Hemoglobin 11.3 (*) 12.0 - 15.0 g/dL   HCT 16.1 (*) 09.6 - 04.5 %   MCV 86.7  78.0 - 100.0 fL   MCH 28.3  26.0 - 34.0 pg   MCHC 32.7  30.0 - 36.0 g/dL   RDW 40.9  81.1 - 91.4 %   Platelets 254  150 - 400 K/uL  COMPREHENSIVE METABOLIC PANEL     Status: Abnormal   Collection Time   11/25/11 12:22 PM      Component Value Range   Sodium 136  135 - 145 mEq/L   Potassium 3.7  3.5 - 5.1 mEq/L   Chloride 99  96 - 112 mEq/L   CO2 27  19 - 32 mEq/L   Glucose, Bld 114 (*) 70 - 99 mg/dL   BUN 19  6 - 23 mg/dL   Creatinine, Ser 7.82 (*) 0.50 - 1.10 mg/dL   Calcium 9.4  8.4 - 95.6 mg/dL   Total Protein 7.5  6.0 - 8.3 g/dL   Albumin 3.8  3.5 - 5.2 g/dL   AST 15  0 - 37 U/L   ALT 13  0 - 35 U/L   Alkaline Phosphatase 106  39 - 117 U/L   Total Bilirubin 0.3  0.3 - 1.2 mg/dL   GFR calc non Af Amer 51 (*) >90 mL/min   GFR calc Af Amer 59 (*) >90 mL/min  WET PREP, GENITAL     Status: Abnormal   Collection Time   11/25/11  1:00 PM      Component Value Range   Yeast Wet Prep HPF POC NONE SEEN  NONE SEEN   Trich, Wet Prep NONE SEEN  NONE SEEN   Clue Cells Wet Prep HPF POC NONE SEEN  NONE SEEN   WBC, Wet Prep HPF POC FEW (*) NONE SEEN     Labs Reviewed  CBC  COMPREHENSIVE METABOLIC PANEL  WET PREP, GENITAL  GC/CHLAMYDIA PROBE AMP, GENITAL  pt felt much better after Toradol 60mg  IM Also pt got some relief with SS enema Discussed with Dr. Debroah Loop- will give Rocpehin 1 mg in MAU followed by Bactrim DS Provera 10mg  gd #30 RF x 1 Pt was feeling much improved by the time she was discharged home  Avapro 300mg  given in MAU  Assessment/plan Abdominal pain- NSAIDs short term Abnormal uterine  bleeding-Provera 10mg  daily until GYN clinic appointment already scheduled Fibroids Constipation-magnesium citrate Hypertension- take medication as prescribed- f/u with PCP ASAP-discussed risks of hypertension

## 2011-11-25 NOTE — MAU Note (Signed)
Patient has history of fibroids

## 2011-11-27 LAB — GC/CHLAMYDIA PROBE AMP, GENITAL: GC Probe Amp, Genital: NEGATIVE

## 2011-12-14 ENCOUNTER — Encounter: Payer: BC Managed Care – PPO | Admitting: Advanced Practice Midwife

## 2012-01-03 ENCOUNTER — Encounter: Payer: Self-pay | Admitting: Internal Medicine

## 2012-01-03 ENCOUNTER — Ambulatory Visit (INDEPENDENT_AMBULATORY_CARE_PROVIDER_SITE_OTHER): Payer: BC Managed Care – PPO | Admitting: Internal Medicine

## 2012-01-03 VITALS — BP 140/86 | HR 80 | Temp 98.3°F | Ht 65.0 in | Wt 256.0 lb

## 2012-01-03 DIAGNOSIS — I10 Essential (primary) hypertension: Secondary | ICD-10-CM

## 2012-01-03 MED ORDER — HYDROCHLOROTHIAZIDE 25 MG PO TABS: 25.0000 mg | ORAL_TABLET | Freq: Every day | ORAL | Status: DC

## 2012-01-03 MED ORDER — LOSARTAN POTASSIUM 50 MG PO TABS: 50.0000 mg | ORAL_TABLET | Freq: Every day | ORAL | Status: DC

## 2012-01-03 NOTE — Patient Instructions (Addendum)
Stop Benicar, take medications as prescribed. Take meds as prescribed  Daily exercise for 30 minutes Low-salt diet! Check the  blood pressure 2 or 3 times a week, be sure it is between 110/60 and 135/85. If it is consistently higher or lower, let me know Please come back in 3 weeks for labs: BMP -----DX hypertension If the blood work is okay , BP is good and you feel well---->  next visit in 6 months

## 2012-01-03 NOTE — Assessment & Plan Note (Addendum)
History of hypertension, previously managed with HCTZ. Currently on Benicar and relatively well control however the patient prefers to go back on   HCTZ because peripheral edema. Also, Benicar is expensive. Plan: Switch to losartan, HCT. See instructions

## 2012-01-03 NOTE — Progress Notes (Signed)
  Subjective:    Patient ID: Kristin Burnett, female    DOB: 1965-07-01, 46 y.o.   MRN: [id number]  HPI New patient  Here for hypertension management, in the past she took hydrochlorothiazide and did very well. After she lost her insurance hydrochlorothiazide was discontinued. Then she saw a new doctor who  prescribe Benicar which she has been taking for a year, feels okay, BP also okay. She however would like to go back to hydrochlorothiazide as she has noted lower extremity edema at the end of the day.  Past medical history Hypertension dx ~ 1996 DUB, history of anemia due to menorrhagia, status post a blood transfusion 2011 G4P3  Past surgical history BTL D&C 2011  Social history Divorced, 3 children, one child at home tobacco--  1 ppd ETOH-- socially   Family history Diabetes-- F CAD-- no Stroke-- F at age ~29 Colon cancer-- no Breast cancer--no    Review of Systems  Respiratory: Negative for shortness of breath.   Cardiovascular: Negative for chest pain and palpitations.  Gastrointestinal: Negative for nausea, vomiting and diarrhea.    Current Outpatient Rx  Name Route Sig Dispense Refill  . MEDROXYPROGESTERONE ACETATE 10 MG PO TABS Oral Take 5 mg by mouth daily.    . TYLENOL EX ST ARTHRITIS PAIN PO Oral Take 4 tablets by mouth 2 (two) times daily as needed. For pain    . HYDROCHLOROTHIAZIDE 25 MG PO TABS Oral Take 1 tablet (25 mg total) by mouth daily. 30 tablet 3  . LOSARTAN POTASSIUM 50 MG PO TABS Oral Take 1 tablet (50 mg total) by mouth daily. 30 tablet 3  . LAXATIVE PILLS PO Oral Take by mouth.    . SENNA-DOCUSATE SODIUM 8.6-50 MG PO TABS Oral Take 1 tablet by mouth daily. 15 tablet 0       Objective:   Physical Exam  General -- alert, well-developed, and overweight appearing. No apparent distress.  Lungs -- normal respiratory effort, no intercostal retractions, no accessory muscle use, and normal breath sounds.   Heart-- normal rate, regular rhythm, no  murmur, and no gallop.   Extremities-- trace  pretibial edema bilaterally  Neurologic-- alert & oriented X3 and strength normal in all extremities. Psych-- Cognition and judgment appear intact. Alert and cooperative with normal attention span and concentration.  not anxious appearing and not depressed appearing.       Assessment & Plan:

## 2012-01-17 ENCOUNTER — Encounter: Payer: Self-pay | Admitting: Obstetrics & Gynecology

## 2012-01-17 ENCOUNTER — Ambulatory Visit (INDEPENDENT_AMBULATORY_CARE_PROVIDER_SITE_OTHER): Payer: BC Managed Care – PPO | Admitting: Obstetrics & Gynecology

## 2012-01-17 VITALS — BP 190/114 | HR 86 | Temp 98.5°F | Resp 20 | Ht 64.0 in | Wt 249.5 lb

## 2012-01-17 DIAGNOSIS — I10 Essential (primary) hypertension: Secondary | ICD-10-CM

## 2012-01-17 DIAGNOSIS — D250 Submucous leiomyoma of uterus: Secondary | ICD-10-CM

## 2012-01-17 MED ORDER — MEDROXYPROGESTERONE ACETATE 10 MG PO TABS: 20.0000 mg | ORAL_TABLET | Freq: Every day | ORAL | Status: DC

## 2012-01-17 MED ORDER — LOSARTAN POTASSIUM 100 MG PO TABS: 100.0000 mg | ORAL_TABLET | Freq: Every day | ORAL | Status: DC

## 2012-01-17 NOTE — Progress Notes (Signed)
Subjective:     Patient ID: Kristin Burnett, female   DOB: November 18, 1965, 46 y.o.   MRN: [id number]  HPI Pt with h/o menorrhagia due to uterine fibroids.  S/p Endometrial ablation with resolution of sx 1/12.  Sx have recently returned.  Pt c/o very heavy cycles with clots.  Pt also c/o increased pain with cycles.    Pt alos c/o frequent urination due to pressure from fibroids.  No dysuria.  No hematuria.  Pt denies HA.  Does report that she was upset this am and that is why her BP is elevated.  SBP was in the 200's in the MAU 1 week prev.   Review of Systems n/c    Objective:   Physical ExamBP 190/114  Pulse 86  Temp 98.5 F (36.9 C) (Oral)  Resp 20  Ht 5\' 4"  (1.626 m)  Wt 249 lb 8 oz (113.172 kg)  BMI 42.83 kg/m2 Repeat BP 170's/100's  Lungs: CTA CV: RRR Abd: obese; NT; ND GU: EGBUS: no lesions Vagina: no blood in vault Cervix: no lesion; no mucopurulent d/c Uterus: enlarged ~14 weeks sized; mobile Adnexa: no masses;  Hct 34%  sono 11/24/2011 Findings:  Uterus: The uterus measures 12.9 x 7.7 x 7.2 cm. Multiple  fibroids are identified. There is a large heterogeneous submucosal  fibroid which arises from the posterior myometrium. This measures  4.5 x 4.4 x 4.0 9 cm. Two smaller fibroids are noted within the  anterior myometrium. These are intramural location.  Endometrium: The endometrium measures 13.6 mm.  Right ovary: Normal appearance/no adnexal mass  Left ovary: Not visualize.  Other Findings: Trace free fluid noted within the pelvis.  IMPRESSION:  1. Large submucosal fibroid is identified within the mid uterine  segment as before. This may account for the patient's vaginal  bleeding and pain.         Assessment:     Symptomatic uterine fibroids - d/w pt treatment options.  Pt had already decided on hyst as she wants definitive treatment.  Patient desires surgical management with robot assisted hyst.    She was told that she will be contacted by our surgical  scheduler regarding the time and date of her surgery.  She was told she would be called for a preoperative appointment prior to surgery and will be given further preoperative instructions at that visit. Printed patient education handouts about the procedure were given to the patient to review at home.  Uncontrolled HTN   Plan:         Increase Cozaar to 100mg  q day Increase Provera to 20mg  q day Schedule robotic assisted TLH Needs preop clearance prior to surgery  Charizma Gardiner L. Harraway-Teffeteller, M.D., [facility]

## 2012-01-17 NOTE — Patient Instructions (Addendum)
Hysterectomy Information  A hysterectomy is a procedure where your uterus is surgically removed. It will no longer be possible to have menstrual periods or to become pregnant. The tubes and ovaries can be removed (bilateral salpingo-oopherectomy) during this surgery as well.  REASONS FOR A HYSTERECTOMY  Persistent, abnormal bleeding.   Lasting (chronic) pelvic pain or infection.   The lining of the uterus (endometrium) starts growing outside the uterus (endometriosis).   The endometrium starts growing in the muscle of the uterus (adenomyosis).   The uterus falls down into the vagina (pelvic organ prolapse).   Symptomatic uterine fibroids.   Precancerous cells.   Cervical cancer or uterine cancer.  TYPES OF HYSTERECTOMIES  Supracervical hysterectomy. This type removes the top part of the uterus, but not the cervix.   Total hysterectomy. This type removes the uterus and cervix.   Radical hysterectomy. This type removes the uterus, cervix, and the fibrous tissue that holds the uterus in place in the pelvis (parametrium).  WAYS A HYSTERECTOMY CAN BE PERFORMED  Abdominal hysterectomy. A large surgical cut (incision) is made in the abdomen. The uterus is removed through this incision.   Vaginal hysterectomy. An incision is made in the vagina. The uterus is removed through this incision. There are no abdominal incisions.   Conventional laparoscopic hysterectomy. A thin, lighted tube with a camera (laparoscope) is inserted into 3 or 4 small incisions in the abdomen. The uterus is cut into small pieces. The small pieces are removed through the incisions, or they are removed through the vagina.   Laparoscopic assisted vaginal hysterectomy (LAVH). Three or four small incisions are made in the abdomen. Part of the surgery is performed laparoscopically and part vaginally. The uterus is removed through the vagina.   Robot-assisted laparoscopic hysterectomy. A laparoscope is inserted into 3 or 4  small incisions in the abdomen. A computer-controlled device is used to give the surgeon a 3D image. This allows for more precise movements of surgical instruments. The uterus is cut into small pieces and removed through the incisions or removed through the vagina.  RISKS OF HYSTERECTOMY   Bleeding and risk of blood transfusion. Tell your caregiver if you do not want to receive any blood products.   Blood clots in the legs or lung.   Infection.   Injury to surrounding organs.   Anesthesia problems or side effects.   Conversion to an abdominal hysterectomy.  WHAT TO EXPECT AFTER A HYSTERECTOMY  You will be given pain medicine.   You will need to have someone with you for the first 3 to 5 days after you go home.   You will need to follow up with your surgeon in 2 to 4 weeks after surgery to evaluate your progress.   You may have early menopause symptoms like hot flashes, night sweats, and insomnia.   If you had a hysterectomy for a problem that was not a cancer or a condition that could lead to cancer, then you no longer need Pap tests. However, even if you no longer need a Pap test, a regular exam is a good idea to make sure no other problems are starting.  Document Released: 10/04/2000 Document Revised: 03/30/2011 Document Reviewed: 11/19/2010 [employer] Patient Information 2012 [employer]  Fibroids You have been diagnosed as having a fibroid. Fibroids are smooth muscle lumps (tumors) which can occur any place in a woman's body. They are usually in the womb (uterus). The most common problem (symptom) of fibroids is bleeding. Over time this   may cause low red blood cells (anemia). Other symptoms include feelings of pressure and pain in the pelvis. The diagnosis (learning what is wrong) of fibroids is made by physical exam. Sometimes tests such as an ultrasound are used. This is helpful when fibroids are felt around the ovaries and to look for tumors. TREATMENT   Most fibroids do not  need surgical or medical treatment. Sometimes a tissue sample (biopsy) of the lining of the uterus is done to rule out cancer. If there is no cancer and only a small amount of bleeding, the problem can be watched.   Hormonal treatment can improve the problem.   When surgery is needed, it can consist of removing the fibroid. Vaginal birth may not be possible after the removal of fibroids. This depends on where they are and the extent of surgery. When pregnancy occurs with fibroids it is usually normal.   Your caregiver can help decide which treatments are best for you.  HOME CARE INSTRUCTIONS   Do not use aspirin as this may increase bleeding problems.   If your periods (menses) are heavy, record the number of pads or tampons used per month. Bring this information to your caregiver. This can help them determine the best treatment for you.  SEEK IMMEDIATE MEDICAL CARE IF:  You have pelvic pain or cramps not controlled with medications, or experience a sudden increase in pain.   You have an increase of pelvic bleeding between and during menses.   You feel lightheaded or have fainting spells.   You develop worsening belly (abdominal) pain.  Document Released: 04/07/2000 Document Revised: 03/30/2011 Document Reviewed: 11/28/2007 [employer] Patient Information 2012 [employer] 

## 2012-01-17 NOTE — Progress Notes (Signed)
Pt states she has been taking provera since MAU visit on 11/25/11. Her bleeding has not stopped.  Pt states she got upset earlier today and feels this is why her BP is elevated.  Pt advised to call Dr. Leta Jungling office for appt and evaluation.

## 2012-01-18 ENCOUNTER — Encounter: Payer: Self-pay | Admitting: *Deleted

## 2012-01-18 ENCOUNTER — Encounter: Payer: Self-pay | Admitting: Internal Medicine

## 2012-01-24 ENCOUNTER — Other Ambulatory Visit: Payer: BC Managed Care – PPO

## 2012-01-29 ENCOUNTER — Other Ambulatory Visit (INDEPENDENT_AMBULATORY_CARE_PROVIDER_SITE_OTHER): Payer: BC Managed Care – PPO

## 2012-01-29 ENCOUNTER — Ambulatory Visit (INDEPENDENT_AMBULATORY_CARE_PROVIDER_SITE_OTHER): Payer: BC Managed Care – PPO | Admitting: Internal Medicine

## 2012-01-29 VITALS — BP 142/88 | HR 86 | Temp 98.4°F | Wt 255.0 lb

## 2012-01-29 DIAGNOSIS — I10 Essential (primary) hypertension: Secondary | ICD-10-CM

## 2012-01-29 DIAGNOSIS — Z01818 Encounter for other preprocedural examination: Secondary | ICD-10-CM

## 2012-01-29 LAB — BASIC METABOLIC PANEL
Calcium: 8.7 mg/dL (ref 8.4–10.5)
GFR: 60.41 mL/min (ref >60.00)
Glucose, Bld: 104 mg/dL — ABNORMAL HIGH (ref 70–99)
Potassium: 4 mEq/L (ref 3.5–5.1)
Sodium: 136 mEq/L (ref 135–145)

## 2012-01-29 MED ORDER — METOPROLOL TARTRATE 50 MG PO TABS: 50.0000 mg | ORAL_TABLET | Freq: Two times a day (BID) | ORAL | Status: DC

## 2012-01-29 MED ORDER — LOSARTAN POTASSIUM-HCTZ 100-25 MG PO TABS: 1.0000 | ORAL_TABLET | Freq: Every day | ORAL | Status: DC

## 2012-01-29 NOTE — Assessment & Plan Note (Signed)
Since her last office visit, her gynecologist increase losartan dose from 50 mg to 100 mg. She also takes HZTZ, edema is still there but better. At this point she needs better control, BP today is 142/88. Will less than 135/85 Continue with losartan HCT Add metoprolol (for now we will avoid CCBs due to edema) see Instructions Check a BMP

## 2012-01-29 NOTE — Patient Instructions (Addendum)
Continue taking losartan 100 mg and HCTZ 25 mg (either separately or  combined on a single pill) Start Metoprolol 50 mg twice a day Please come back to this office in 2 weeks for a nurse visit, make an appointment, will check your BP to be sure it is okay. Once the BP is okay, we'll clear you for surgery. Next office visit here in 5-6 months

## 2012-01-29 NOTE — Progress Notes (Signed)
  Subjective:    Patient ID: Kristin Burnett, female    DOB: 01-29-1966, 46 y.o.   MRN: [id number]  HPI Followup Hypertension, good tolerance to losartan and HCT, saw her gynecologist and  she was recommended to increase losartan from 50 mg to 100 mg last week. Also, she is in need of a hysterectomy scheduled for next month, needs surgical clearance.   Past medical history Hypertension dx ~ 1996 DUB, history of anemia due to menorrhagia, status post a blood transfusion 2011 G4P3  Past surgical history BTL D&C 2011  Social history Divorced, 3 children, one child at home tobacco--  1 ppd ETOH-- socially   Family history Diabetes-- F CAD-- no Stroke-- F at age ~43 Colon cancer-- no Breast cancer--no   Review of Systems Denies chest pain or shortness of breath, no orthopnea. Had lower extremity edema, with the addition of HCTZ to her regimen the edema is better, still has some at the end of the day particularly if she is up all day. Denies orthopnea. She is able to do all her normal activities and go upstairs 3 flights without difficulty. Note nausea, vomiting, diarrhea. She does admits to snoring but denies lack of energy or feeling sleepy throughout the day.    Objective:   Physical Exam General -- alert, well-developed  Neck --no JVD Lungs -- normal respiratory effort, no intercostal retractions, no accessory muscle use, and normal breath sounds.   Heart-- normal rate, regular rhythm, no murmur, and no gallop.   Extremities-- no pretibial edema bilaterally  Neurologic-- alert & oriented X3 and strength normal in all extremities. Psych-- Cognition and judgment appear intact. Alert and cooperative with normal attention span and concentration.  not anxious appearing and not depressed appearing.       Assessment & Plan:

## 2012-01-30 ENCOUNTER — Encounter: Payer: Self-pay | Admitting: Internal Medicine

## 2012-01-30 DIAGNOSIS — Z01818 Encounter for other preprocedural examination: Secondary | ICD-10-CM | POA: Insufficient documentation

## 2012-01-30 NOTE — Assessment & Plan Note (Addendum)
New problem. 46 year old lady needed a hysterectomy. She seems to be doing well from a cardiopulmonary standpoint, see ROS. Although she is overweight and snores, she denies feeling sleepy throughout the day. Plan: once we have better control of her BP,  she will be cleared for surgery

## 2012-02-12 ENCOUNTER — Ambulatory Visit (INDEPENDENT_AMBULATORY_CARE_PROVIDER_SITE_OTHER): Payer: BC Managed Care – PPO

## 2012-02-12 VITALS — BP 148/90 | HR 73

## 2012-02-12 DIAGNOSIS — I10 Essential (primary) hypertension: Secondary | ICD-10-CM

## 2012-02-12 MED ORDER — AMLODIPINE BESYLATE 5 MG PO TABS: 5.0000 mg | ORAL_TABLET | Freq: Every day | ORAL | Status: DC

## 2012-02-12 NOTE — Patient Instructions (Addendum)
Increase metoprolol to 1.5 tablets twice a day Start amlodipine 5 mg one tablet a day Watch for lower extremity edema Come back for a nurse visit in 2 weeks for a BP check.

## 2012-02-12 NOTE — Assessment & Plan Note (Signed)
BP continued to be elevated despite taking Hyzaar HCT 100/25 and Lopressor 51 tablet twice a day. Plan: Increase Lopressor 50 mg to 1.5 tablets twice a day Amlodipine 5 mg, watch for lower extremity edema

## 2012-02-21 ENCOUNTER — Ambulatory Visit (INDEPENDENT_AMBULATORY_CARE_PROVIDER_SITE_OTHER): Payer: BC Managed Care – PPO | Admitting: Obstetrics & Gynecology

## 2012-02-21 ENCOUNTER — Encounter: Payer: Self-pay | Admitting: Obstetrics & Gynecology

## 2012-02-21 VITALS — BP 120/81 | HR 65 | Temp 98.4°F | Ht 64.0 in | Wt 247.5 lb

## 2012-02-21 DIAGNOSIS — D219 Benign neoplasm of connective and other soft tissue, unspecified: Secondary | ICD-10-CM

## 2012-02-21 DIAGNOSIS — Z1231 Encounter for screening mammogram for malignant neoplasm of breast: Secondary | ICD-10-CM

## 2012-02-21 DIAGNOSIS — I10 Essential (primary) hypertension: Secondary | ICD-10-CM

## 2012-02-21 DIAGNOSIS — D259 Leiomyoma of uterus, unspecified: Secondary | ICD-10-CM

## 2012-02-21 DIAGNOSIS — Z139 Encounter for screening, unspecified: Secondary | ICD-10-CM

## 2012-02-21 NOTE — Patient Instructions (Addendum)
Total Laparoscopic Hysterectomy A total laparoscopic hysterectomy is a minimally invasive surgery to remove your uterus and cervix. This surgery is performed by making several small cuts (incisions) in your abdomen. It can also be done with a thin, lighted tube (laparoscope) inserted into 2 small incisions in the lower abdomen. Your fallopian tubes and ovaries can be removed (bilateral salpingo-oopherectomy) during this surgery as well.If a total laparoscopic hysterectomy is started and it is not safe to continue, the laparoscopic surgery will be converted to an open abdominal surgery. You will not have menstrual periods or be able to get pregnant after having this surgery. If a bilateral salpingo-oopherectomy was performed before menopause, you will go through a sudden (abrupt) menopause. This can be helped with hormone medicines. Benefits of minimally invasive surgery include:  Less pain.  Less risk of blood loss.  Less risk of infection.  Quicker return to normal activities.  Usually a 1 night stay in the hospital.  Overall patient satisfaction. LET YOUR CAREGIVER KNOW ABOUT:  Any history of abnormal Pap tests.  Allergies to food or medicine.  Medicines taken, including vitamins, herbs, eyedrops, over-the-counter medicines, and creams.  Use of steroids (by mouth or creams).  Previous problems with anesthetics or numbing medicines.  History of bleeding problems or blood clots.  Previous surgery.  Other health problems, including diabetes and kidney problems.  Desire for future fertility.  Any infections or colds you may have developed.  Symptoms of irregular or heavy periods, weight loss, or urinary or bowel changes. RISKS AND COMPLICATIONS   Bleeding.  Blood clots in the legs or lung.  Infection.  Injury to surrounding organs.  Problems with anesthesia.  Early menopause symptoms (hot flashes, night sweats, insomnia).  Risk of conversion to an open abdominal  incision. BEFORE THE PROCEDURE  Ask your caregiver about changing or stopping your regular medicines.  Do not take aspirin or blood thinners (anticoagulants) for 1 week before the surgery, or as told by your caregiver.  Do not eat or drink anything for 8 hours before the surgery, or as told by your caregiver.  Quit smoking if you smoke.  Arrange for a ride home after surgery and for someone to help you at home during recovery. PROCEDURE   You will be given antibiotic medicine.  An intravenous (IV) line will be placed in your arm. You will be given medicine to make you sleep (general anesthetic).  A gas (carbon dioxide) will be used to inflate your abdomen. This will allow your surgeon to look inside your abdomen, perform your surgery, and treat any other problems found if necessary.  Three or four small incisions (often less than  inch) will be made in your abdomen. One of these incisions will be made in the area of your belly button (navel). The laparoscope will be inserted into the incision. Your surgeon will look through the laparoscope while doing your procedure.  Other surgical instruments will be inserted through the other incisions.  The uterus may be removed through the vagina or cut into small pieces and removed through the small incisions.  Your incisions will be closed. AFTER THE PROCEDURE  The gas will be released from inside your abdomen.  You will be taken to the recovery area where a nurse will watch and check your progress. Once you are awake, stable, and taking fluids well, without other problems, you will return to your room or be allowed to go home.  There is usually minimal discomfort following the surgery because  the incisions are so small.  You will be given pain medicine while you are in the hospital and for when you go home.  Try to have someone with you the first 3 to 5 days after you go home.  Follow up with your surgeon in 2 to 4 weeks after surgery  to evaluate your progress. Document Released: 02/05/2007 Document Revised: 07/03/2011 Document Reviewed: 11/25/2010 [facility] Patient Information 2013 [place], [place]. Hypertension As your heart beats, it forces blood through your arteries. This force is your blood pressure. If the pressure is too high, it is called hypertension (HTN) or high blood pressure. HTN is dangerous because you may have it and not know it. High blood pressure may mean that your heart has to work harder to pump blood. Your arteries may be narrow or stiff. The extra work puts you at risk for heart disease, stroke, and other problems.  Blood pressure consists of two numbers, a higher number over a lower, 110/72, for example. It is stated as "110 over 72." The ideal is below 120 for the top number (systolic) and under 80 for the bottom (diastolic). Write down your blood pressure today. You should pay close attention to your blood pressure if you have certain conditions such as:  Heart failure.  Prior heart attack.  Diabetes  Chronic kidney disease.  Prior stroke.  Multiple risk factors for heart disease. To see if you have HTN, your blood pressure should be measured while you are seated with your arm held at the level of the heart. It should be measured at least twice. A one-time elevated blood pressure reading (especially in the Emergency Department) does not mean that you need treatment. There may be conditions in which the blood pressure is different between your right and left arms. It is important to see your caregiver soon for a recheck. Most people have essential hypertension which means that there is not a specific cause. This type of high blood pressure may be lowered by changing lifestyle factors such as:  Stress.  Smoking.  Lack of exercise.  Excessive weight.  Drug/tobacco/alcohol use.  Eating less salt. Most people do not have symptoms from high blood pressure until it has caused damage to the body.  Effective treatment can often prevent, delay or reduce that damage. TREATMENT  When a cause has been identified, treatment for high blood pressure is directed at the cause. There are a large number of medications to treat HTN. These fall into several categories, and your caregiver will help you select the medicines that are best for you. Medications may have side effects. You should review side effects with your caregiver. If your blood pressure stays high after you have made lifestyle changes or started on medicines,   Your medication(s) may need to be changed.  Other problems may need to be addressed.  Be certain you understand your prescriptions, and know how and when to take your medicine.  Be sure to follow up with your caregiver within the time frame advised (usually within two weeks) to have your blood pressure rechecked and to review your medications.  If you are taking more than one medicine to lower your blood pressure, make sure you know how and at what times they should be taken. Taking two medicines at the same time can result in blood pressure that is too low. SEEK IMMEDIATE MEDICAL CARE IF:  You develop a severe headache, blurred or changing vision, or confusion.  You have unusual weakness or numbness, or  a faint feeling.  You have severe chest or abdominal pain, vomiting, or breathing problems. MAKE SURE YOU:   Understand these instructions.  Will watch your condition.  Will get help right away if you are not doing well or get worse. Document Released: 04/10/2005 Document Revised: 07/03/2011 Document Reviewed: 11/29/2007 [facility] Patient Information 2013 [place], [place].

## 2012-02-21 NOTE — Progress Notes (Signed)
Patient ID: Kristin Burnett, female   DOB: 1966/03/04, 46 y.o.   MRN: [id number] Kristin Burnett is an 46 y.o. female. P with h/o uterine fibroids and elevated BP.  Pt is scheduled for a robot assisted TLH for symptomatic uterine fibroids.  Pt c/o heavy bleeding and pain.  Pertinent Gynecological History: Menses: flow is excessive with use of 6-8 pads or tampons on heaviest days Bleeding: dysfunctional uterine bleeding Contraception: tubal ligation DES exposure: unknown Blood transfusions: none Sexually transmitted diseases: no past history Previous GYN Procedures: endometrial ablation  Last mammogram: normal Date:newver had Last pap: normal Date: summer 2013 OB History: G4, P3013   Menstrual History: Menarche age: 46 No LMP recorded.    Past Medical History  Diagnosis Date  . Hypertension   . Anemia     Past Surgical History  Procedure Date  . Tubal ligation   . Laser ablation     Family History  Problem Relation Age of Onset  . Hypertension Mother   . Arthritis Mother   . Anemia Mother   . Hypertension Father   . Diabetes Father   . Stroke Father   . Heart disease Father   . Heart disease Brother     Social History:  reports that she has been smoking Cigarettes.  She has a 20 pack-year smoking history. She does not have any smokeless tobacco history on file. She reports that she drinks alcohol. She reports that she does not use illicit drugs.  Allergies: No Known Allergies   (Not in a hospital admission)  ROS  Blood pressure 120/81, pulse 65, temperature 98.4 F (36.9 C), temperature source Oral, height 5\' 4"  (1.626 m), weight 247 lb 8 oz (112.265 kg). Physical Exam Pt in NAD Lungs: CTA CV: RRR Abd: obese, NT ND GU: EGBUS: no lesions Vagina: no blood in vault Cervix: no lesion; no mucopurulent d/c Uterus: enlarged; poor decensus Adnexa: no masses; non tender       04/27/10 FINAL DIAGNOSIS  1. Endometrium, curettage, : - ABUNDANT BLOOD AND BENIGN  PROLIFERATIVE ENDOMETRIUM, NO ATYPIA, HYPERPLASIA OR MALIGNANCY. - FRAGMENTS OF ENDOCERVICAL MUCOSA WITH NO DYSPLASIA OR MALIGNANCY.  Sono: 11/25/11 Findings:  Uterus: The uterus measures 12.9 x 7.7 x 7.2 cm. Multiple  fibroids are identified. There is a large heterogeneous submucosal  fibroid which arises from the posterior myometrium. This measures  4.5 x 4.4 x 4.0 9 cm. Two smaller fibroids are noted within the  anterior myometrium. These are intramural location.  Endometrium: The endometrium measures 13.6 mm.  Right ovary: Normal appearance/no adnexal mass  Left ovary: Not visualize.  Other Findings: Trace free fluid noted within the pelvis.  IMPRESSION:  1. Large submucosal fibroid is identified within the mid uterine  segment as before. This may account for the patient's vaginal  bleeding and pain.    No results found for this or any previous visit (from the past 24 hour(s)).  No results found.  Assessment/Plan: F/u for Robot assist TLH with B salpingectomy Mammogram PRIOR to surgery Pt to bring in results of last PAP within the next 2 days I reviewed with pt the risks including bleeding, infection, damage to surrounding organs and alternatives to surgery. We also  Reviewed op instructions including that pt CANNOT be sexually active for 8 full weeks after surgery.   Pt to continue her current BP meds Rec tobacco cessation HARRAWAY-Sturgess, Griffon Herberg 02/21/2012, 1:13 PM

## 2012-02-23 ENCOUNTER — Encounter (HOSPITAL_COMMUNITY): Payer: Self-pay | Admitting: Pharmacist

## 2012-02-27 ENCOUNTER — Inpatient Hospital Stay (HOSPITAL_COMMUNITY): Admission: RE | Admit: 2012-02-27 | Payer: BC Managed Care – PPO | Source: Ambulatory Visit

## 2012-02-29 ENCOUNTER — Encounter (HOSPITAL_COMMUNITY)
Admission: RE | Admit: 2012-02-29 | Discharge: 2012-02-29 | Disposition: A | Discharge status: Home / Self Care | Payer: BC Managed Care – PPO | Source: Ambulatory Visit | Attending: Obstetrics & Gynecology | Admitting: Obstetrics & Gynecology

## 2012-02-29 ENCOUNTER — Ambulatory Visit (HOSPITAL_COMMUNITY)
Admission: RE | Admit: 2012-02-29 | Discharge: 2012-02-29 | Disposition: A | Discharge status: Home / Self Care | Payer: BC Managed Care – PPO | Source: Ambulatory Visit | Attending: Obstetrics & Gynecology | Admitting: Obstetrics & Gynecology

## 2012-02-29 ENCOUNTER — Encounter (HOSPITAL_COMMUNITY): Payer: Self-pay

## 2012-02-29 DIAGNOSIS — Z1231 Encounter for screening mammogram for malignant neoplasm of breast: Secondary | ICD-10-CM | POA: Insufficient documentation

## 2012-02-29 DIAGNOSIS — Z139 Encounter for screening, unspecified: Secondary | ICD-10-CM

## 2012-02-29 HISTORY — DX: Gastro-esophageal reflux disease without esophagitis: K21.9

## 2012-02-29 HISTORY — DX: Personal history of other medical treatment: Z92.89

## 2012-02-29 LAB — CBC
HCT: 30.7 % — ABNORMAL LOW (ref 36.0–46.0)
Hemoglobin: 9.3 g/dL — ABNORMAL LOW (ref 12.0–15.0)
MCHC: 30.3 g/dL (ref 30.0–36.0)
WBC: 10.8 10*3/uL — ABNORMAL HIGH (ref 4.0–10.5)

## 2012-02-29 LAB — BASIC METABOLIC PANEL
Chloride: 101 mEq/L (ref 96–112)
GFR calc Af Amer: 55 mL/min — ABNORMAL LOW (ref >90)
GFR calc non Af Amer: 48 mL/min — ABNORMAL LOW (ref >90)
Glucose, Bld: 105 mg/dL — ABNORMAL HIGH (ref 70–99)
Potassium: 3.7 mEq/L (ref 3.5–5.1)
Sodium: 135 mEq/L (ref 135–145)

## 2012-02-29 LAB — SURGICAL PCR SCREEN: Staphylococcus aureus: NEGATIVE

## 2012-02-29 NOTE — Patient Instructions (Addendum)
   Your procedure is scheduled ON:GEXBMWUX November 14th  Enter through the Main Entrance of [facility] at:6am Pick up the phone at the desk and dial [phone] and inform us of your arrival.  Please call this number if you have any problems the morning of surgery: [phone]  Remember: Do not eat or drink anything after midnight on Wednesday Please take all your blood pressure medications on morning of surgery  Do not wear jewelry, make-up, or FINGER nail polish No metal in your hair or on your body. Do not wear lotions, powders, perfumes. You may wear deodorant.  Please use your CHG wash as directed prior to surgery.  Do not shave anywhere for at least 12 hours prior to first CHG shower.  Do not bring valuables to the hospital.   Leave suitcase in the car. After Surgery it may be brought to your room. For patients being admitted to the hospital, checkout time is 11:00am the day of discharge.  Patients discharged on the day of surgery will not be allowed to drive home.

## 2012-02-29 NOTE — Pre-Procedure Instructions (Signed)
Reviewed ekg from July 2013 with Dr Sheral Apley. Reviewed b/p from pat appointment. 147/97 on left, recheck on right with same large cuff/same machine-157/101. Recheck on left with different machine large cuff 157/100. Last b/p 02/21/12 in [facility] 120/81. Continue current b/p meds.

## 2012-03-04 ENCOUNTER — Other Ambulatory Visit: Payer: Self-pay | Admitting: *Deleted

## 2012-03-04 DIAGNOSIS — D250 Submucous leiomyoma of uterus: Secondary | ICD-10-CM

## 2012-03-04 MED ORDER — MEDROXYPROGESTERONE ACETATE 10 MG PO TABS: 10.0000 mg | ORAL_TABLET | Freq: Every day | ORAL | Status: DC

## 2012-03-04 NOTE — Telephone Encounter (Signed)
Eppie called and states she needs a refill of her provera. States having surgery 03/07/12 and states Dr. Burnice Logan -Katrinka Blazing told her she did not want her bleeding before surgery- states takes 2 provera a day and sometimes up to 4 if needed if she feels bloated. States has been having spotting lately and wants just enough pills to get her through until surgery.  Uses [facility] on [address]. Informed patient would need to talk to doctor first and then call her back.

## 2012-03-04 NOTE — Telephone Encounter (Signed)
Discussed with Dr. Macon Large and sent prescription for 10 provera to patient pharmacy and called patient and notified  Her.

## 2012-03-04 NOTE — Telephone Encounter (Signed)
Called in prescription to [facility].

## 2012-03-07 ENCOUNTER — Encounter (HOSPITAL_COMMUNITY): Payer: Self-pay | Admitting: Anesthesiology

## 2012-03-07 ENCOUNTER — Encounter (HOSPITAL_COMMUNITY): Payer: Self-pay

## 2012-03-07 ENCOUNTER — Ambulatory Visit (HOSPITAL_COMMUNITY)
Admission: RE | Admit: 2012-03-07 | Discharge: 2012-03-07 | Disposition: A | Discharge status: Home / Self Care | Payer: BC Managed Care – PPO | Source: Ambulatory Visit | Attending: Obstetrics & Gynecology | Admitting: Obstetrics & Gynecology

## 2012-03-07 ENCOUNTER — Ambulatory Visit (HOSPITAL_COMMUNITY): Payer: BC Managed Care – PPO | Admitting: Anesthesiology

## 2012-03-07 ENCOUNTER — Encounter (HOSPITAL_COMMUNITY)
Admission: RE | Disposition: A | Discharge status: Home / Self Care | Payer: Self-pay | Source: Ambulatory Visit | Attending: Obstetrics & Gynecology

## 2012-03-07 DIAGNOSIS — D250 Submucous leiomyoma of uterus: Secondary | ICD-10-CM | POA: Insufficient documentation

## 2012-03-07 DIAGNOSIS — N8000 Endometriosis of the uterus, unspecified: Secondary | ICD-10-CM | POA: Insufficient documentation

## 2012-03-07 DIAGNOSIS — N949 Unspecified condition associated with female genital organs and menstrual cycle: Secondary | ICD-10-CM

## 2012-03-07 DIAGNOSIS — D259 Leiomyoma of uterus, unspecified: Secondary | ICD-10-CM

## 2012-03-07 DIAGNOSIS — D251 Intramural leiomyoma of uterus: Secondary | ICD-10-CM | POA: Insufficient documentation

## 2012-03-07 DIAGNOSIS — N920 Excessive and frequent menstruation with regular cycle: Secondary | ICD-10-CM | POA: Insufficient documentation

## 2012-03-07 DIAGNOSIS — D509 Iron deficiency anemia, unspecified: Secondary | ICD-10-CM

## 2012-03-07 DIAGNOSIS — I10 Essential (primary) hypertension: Secondary | ICD-10-CM

## 2012-03-07 HISTORY — PX: CYSTOSCOPY: SHX5120

## 2012-03-07 HISTORY — PX: BILATERAL SALPINGECTOMY: SHX5743

## 2012-03-07 HISTORY — PX: ROBOTIC ASSISTED TOTAL HYSTERECTOMY: SHX6085

## 2012-03-07 LAB — CBC
Hemoglobin: 9.3 g/dL — ABNORMAL LOW (ref 12.0–15.0)
MCH: 21.8 pg — ABNORMAL LOW (ref 26.0–34.0)
RBC: 4.26 MIL/uL (ref 3.87–5.11)

## 2012-03-07 LAB — PREGNANCY, URINE: Preg Test, Ur: NEGATIVE

## 2012-03-07 LAB — TYPE AND SCREEN
ABO/RH(D): B POS
Antibody Screen: NEGATIVE

## 2012-03-07 SURGERY — ROBOTIC ASSISTED TOTAL HYSTERECTOMY
Anesthesia: General | Site: Bladder | Wound class: Clean Contaminated

## 2012-03-07 MED ORDER — NEOSTIGMINE METHYLSULFATE 1 MG/ML IJ SOLN
INTRAMUSCULAR | Status: AC
  Filled 2012-03-07: qty 10

## 2012-03-07 MED ORDER — MIDAZOLAM HCL 5 MG/5ML IJ SOLN
INTRAMUSCULAR | Status: DC | PRN
  Administered 2012-03-07: 2 mg via INTRAVENOUS

## 2012-03-07 MED ORDER — LIDOCAINE HCL (CARDIAC) 20 MG/ML IV SOLN
INTRAVENOUS | Status: DC | PRN
  Administered 2012-03-07: 80 mg via INTRAVENOUS

## 2012-03-07 MED ORDER — NEOSTIGMINE METHYLSULFATE 1 MG/ML IJ SOLN
INTRAMUSCULAR | Status: DC | PRN
  Administered 2012-03-07: 4 mg via INTRAVENOUS

## 2012-03-07 MED ORDER — LACTATED RINGERS IV SOLN
INTRAVENOUS | Status: DC
  Administered 2012-03-07 (×2): via INTRAVENOUS

## 2012-03-07 MED ORDER — OXYCODONE-ACETAMINOPHEN 5-325 MG PO TABS
1.0000 | ORAL_TABLET | ORAL | Status: DC | PRN
  Administered 2012-03-07: 1 via ORAL
  Filled 2012-03-07: qty 2

## 2012-03-07 MED ORDER — HYDROMORPHONE HCL PF 1 MG/ML IJ SOLN
INTRAMUSCULAR | Status: AC
  Administered 2012-03-07: 0.5 mg via INTRAVENOUS
  Filled 2012-03-07: qty 1

## 2012-03-07 MED ORDER — PHENYLEPHRINE HCL 10 MG/ML IJ SOLN
INTRAMUSCULAR | Status: DC | PRN
  Administered 2012-03-07 (×3): 40 ug via INTRAVENOUS
  Administered 2012-03-07: 80 ug via INTRAVENOUS
  Administered 2012-03-07: 40 ug via INTRAVENOUS

## 2012-03-07 MED ORDER — BUPIVACAINE HCL (PF) 0.5 % IJ SOLN
INTRAMUSCULAR | Status: DC | PRN
  Administered 2012-03-07: 30 mL

## 2012-03-07 MED ORDER — CEFAZOLIN SODIUM-DEXTROSE 2-3 GM-% IV SOLR: 2.0000 g | INTRAVENOUS | Status: DC

## 2012-03-07 MED ORDER — ROCURONIUM BROMIDE 50 MG/5ML IV SOLN
INTRAVENOUS | Status: AC
  Filled 2012-03-07: qty 1

## 2012-03-07 MED ORDER — GLYCOPYRROLATE 0.2 MG/ML IJ SOLN
INTRAMUSCULAR | Status: AC
  Filled 2012-03-07: qty 2

## 2012-03-07 MED ORDER — LACTATED RINGERS IR SOLN
Status: DC | PRN
  Administered 2012-03-07: 3000 mL

## 2012-03-07 MED ORDER — INDIGOTINDISULFONATE SODIUM 8 MG/ML IJ SOLN
INTRAMUSCULAR | Status: DC | PRN
  Administered 2012-03-07: 3 mL via INTRAVENOUS

## 2012-03-07 MED ORDER — OXYCODONE-ACETAMINOPHEN 7.5-500 MG PO TABS: 1.0000 | ORAL_TABLET | ORAL | Status: DC | PRN

## 2012-03-07 MED ORDER — CEFAZOLIN SODIUM-DEXTROSE 2-3 GM-% IV SOLR
INTRAVENOUS | Status: AC
  Administered 2012-03-07: 3 g via INTRAVENOUS
  Filled 2012-03-07: qty 50

## 2012-03-07 MED ORDER — MIDAZOLAM HCL 2 MG/2ML IJ SOLN
INTRAMUSCULAR | Status: AC
  Filled 2012-03-07: qty 2

## 2012-03-07 MED ORDER — PROPOFOL 10 MG/ML IV EMUL
INTRAVENOUS | Status: AC
  Filled 2012-03-07: qty 20

## 2012-03-07 MED ORDER — PHENYLEPHRINE 40 MCG/ML (10ML) SYRINGE FOR IV PUSH (FOR BLOOD PRESSURE SUPPORT)
PREFILLED_SYRINGE | INTRAVENOUS | Status: AC
  Filled 2012-03-07: qty 5

## 2012-03-07 MED ORDER — ROCURONIUM BROMIDE 100 MG/10ML IV SOLN
INTRAVENOUS | Status: DC | PRN
  Administered 2012-03-07 (×2): 10 mg via INTRAVENOUS
  Administered 2012-03-07: 40 mg via INTRAVENOUS

## 2012-03-07 MED ORDER — ACETAMINOPHEN 10 MG/ML IV SOLN
INTRAVENOUS | Status: DC | PRN
  Administered 2012-03-07: 1000 mg via INTRAVENOUS

## 2012-03-07 MED ORDER — GLYCOPYRROLATE 0.2 MG/ML IJ SOLN
INTRAMUSCULAR | Status: DC | PRN
  Administered 2012-03-07: .6 mg via INTRAVENOUS

## 2012-03-07 MED ORDER — HYDROMORPHONE HCL PF 1 MG/ML IJ SOLN
0.2500 mg | INTRAMUSCULAR | Status: DC | PRN
  Administered 2012-03-07 (×2): 0.5 mg via INTRAVENOUS

## 2012-03-07 MED ORDER — PROPOFOL 10 MG/ML IV EMUL
INTRAVENOUS | Status: DC | PRN
  Administered 2012-03-07: 200 mg via INTRAVENOUS
  Administered 2012-03-07 (×2): 50 mg via INTRAVENOUS
  Administered 2012-03-07: 40 mg via INTRAVENOUS

## 2012-03-07 MED ORDER — FENTANYL CITRATE 0.05 MG/ML IJ SOLN
INTRAMUSCULAR | Status: AC
  Filled 2012-03-07: qty 5

## 2012-03-07 MED ORDER — FENTANYL CITRATE 0.05 MG/ML IJ SOLN
INTRAMUSCULAR | Status: DC | PRN
  Administered 2012-03-07 (×5): 50 ug via INTRAVENOUS

## 2012-03-07 MED ORDER — STERILE WATER FOR IRRIGATION IR SOLN
Status: DC | PRN
  Administered 2012-03-07: 1000 mL via INTRAVESICAL

## 2012-03-07 MED ORDER — MORPHINE SULFATE 4 MG/ML IJ SOLN: 2.0000 mg | INTRAMUSCULAR | Status: DC | PRN

## 2012-03-07 MED ORDER — PROPOFOL 10 MG/ML IV EMUL
INTRAVENOUS | Status: AC
  Filled 2012-03-07: qty 40

## 2012-03-07 MED ORDER — BUPIVACAINE HCL (PF) 0.5 % IJ SOLN
INTRAMUSCULAR | Status: AC
  Filled 2012-03-07: qty 30

## 2012-03-07 MED ORDER — ACETAMINOPHEN 10 MG/ML IV SOLN
1000.0000 mg | Freq: Four times a day (QID) | INTRAVENOUS | Status: DC
  Filled 2012-03-07 (×5): qty 100

## 2012-03-07 MED ORDER — INDIGOTINDISULFONATE SODIUM 8 MG/ML IJ SOLN
INTRAMUSCULAR | Status: AC
  Filled 2012-03-07: qty 5

## 2012-03-07 MED ORDER — LIDOCAINE HCL (CARDIAC) 20 MG/ML IV SOLN
INTRAVENOUS | Status: AC
  Filled 2012-03-07: qty 5

## 2012-03-07 MED ORDER — CEFAZOLIN SODIUM 1-5 GM-% IV SOLN
1.0000 g | Freq: Once | INTRAVENOUS | Status: DC
  Filled 2012-03-07: qty 50

## 2012-03-07 MED ORDER — ONDANSETRON HCL 4 MG/2ML IJ SOLN
INTRAMUSCULAR | Status: AC
  Filled 2012-03-07: qty 2

## 2012-03-07 MED ORDER — LACTATED RINGERS IV SOLN: INTRAVENOUS | Status: DC

## 2012-03-07 MED ORDER — ONDANSETRON HCL 4 MG/2ML IJ SOLN
INTRAMUSCULAR | Status: DC | PRN
  Administered 2012-03-07: 4 mg via INTRAVENOUS

## 2012-03-07 MED ORDER — ARTIFICIAL TEARS OP OINT
TOPICAL_OINTMENT | OPHTHALMIC | Status: AC
  Filled 2012-03-07: qty 3.5

## 2012-03-07 MED ORDER — SODIUM CHLORIDE 0.9 % IJ SOLN
INTRAMUSCULAR | Status: DC | PRN
  Administered 2012-03-07: 10 mL

## 2012-03-07 SURGICAL SUPPLY — 62 items
ADH SKN CLS APL DERMABOND .7 (GAUZE/BANDAGES/DRESSINGS) ×3
APL SKNCLS STERI-STRIP NONHPOA (GAUZE/BANDAGES/DRESSINGS) ×3
BAG URINE DRAINAGE (UROLOGICAL SUPPLIES) ×4 IMPLANT
BENZOIN TINCTURE PRP APPL 2/3 (GAUZE/BANDAGES/DRESSINGS) ×4 IMPLANT
CABLE HIGH FREQUENCY MONO STRZ (ELECTRODE) ×4 IMPLANT
CATH FOLEY 3WAY  5CC 16FR (CATHETERS) ×1
CATH FOLEY 3WAY 5CC 16FR (CATHETERS) ×3 IMPLANT
CLOTH BEACON ORANGE TIMEOUT ST (SAFETY) ×4 IMPLANT
CONT PATH 16OZ SNAP LID 3702 (MISCELLANEOUS) ×4 IMPLANT
COVER MAYO STAND STRL (DRAPES) ×4 IMPLANT
COVER TABLE BACK 60X90 (DRAPES) ×8 IMPLANT
COVER TIP SHEARS 8 DVNC (MISCELLANEOUS) ×3 IMPLANT
COVER TIP SHEARS 8MM DA VINCI (MISCELLANEOUS) ×1
DECANTER SPIKE VIAL GLASS SM (MISCELLANEOUS) ×4 IMPLANT
DERMABOND ADVANCED (GAUZE/BANDAGES/DRESSINGS) ×1
DERMABOND ADVANCED .7 DNX12 (GAUZE/BANDAGES/DRESSINGS) ×3 IMPLANT
DRAPE HUG U DISPOSABLE (DRAPE) ×4 IMPLANT
DRAPE LG THREE QUARTER DISP (DRAPES) ×8 IMPLANT
DRAPE WARM FLUID 44X44 (DRAPE) ×4 IMPLANT
ELECT REM PT RETURN 9FT ADLT (ELECTROSURGICAL) ×4
ELECTRODE REM PT RTRN 9FT ADLT (ELECTROSURGICAL) ×3 IMPLANT
EVACUATOR SMOKE 8.L (FILTER) ×4 IMPLANT
GLOVE BIO SURGEON STRL SZ 6.5 (GLOVE) ×11 IMPLANT
GLOVE BIO SURGEON STRL SZ7 (GLOVE) ×3 IMPLANT
GLOVE BIOGEL PI IND STRL 7.0 (GLOVE) ×6 IMPLANT
GLOVE BIOGEL PI INDICATOR 7.0 (GLOVE) ×2
GOWN STRL REIN XL XLG (GOWN DISPOSABLE) ×24 IMPLANT
GYRUS RUMI II 2.5CM BLUE (DISPOSABLE) ×4
KIT ACCESSORY DA VINCI DISP (KITS) ×1
KIT ACCESSORY DVNC DISP (KITS) ×3 IMPLANT
LEGGING LITHOTOMY PAIR STRL (DRAPES) ×4 IMPLANT
NDL INSUFFLATION 14GA 120MM (NEEDLE) ×3 IMPLANT
NEEDLE INSUFFLATION 14GA 120MM (NEEDLE) ×4 IMPLANT
OCCLUDER COLPOPNEUMO (BALLOONS) ×4 IMPLANT
PACK LAVH (CUSTOM PROCEDURE TRAY) ×4 IMPLANT
PAD PREP 24X48 CUFFED NSTRL (MISCELLANEOUS) ×8 IMPLANT
PLUG CATH AND CAP STER (CATHETERS) ×4 IMPLANT
PROTECTOR NERVE ULNAR (MISCELLANEOUS) ×8 IMPLANT
RUMI II GYRUS 2.5CM BLUE (DISPOSABLE) IMPLANT
SCISSORS LAP 5X35 DISP (ENDOMECHANICALS) ×1 IMPLANT
SET CYSTO W/LG BORE CLAMP LF (SET/KITS/TRAYS/PACK) ×1 IMPLANT
SET IRRIG TUBING LAPAROSCOPIC (IRRIGATION / IRRIGATOR) ×4 IMPLANT
SOLUTION ELECTROLUBE (MISCELLANEOUS) ×4 IMPLANT
STRIP CLOSURE SKIN 1/2X4 (GAUZE/BANDAGES/DRESSINGS) ×5 IMPLANT
SURGIFLO W/THROMBIN 8M KIT (HEMOSTASIS) ×1 IMPLANT
SUT MNCRL AB 4-0 PS2 18 (SUTURE) ×8 IMPLANT
SUT VIC AB 0 CT1 27 (SUTURE) ×8
SUT VIC AB 0 CT1 27XBRD ANBCTR (SUTURE) ×6 IMPLANT
SUT VICRYL 0 UR6 27IN ABS (SUTURE) ×8 IMPLANT
SUT VLOC 180 0 9IN  GS21 (SUTURE) ×1
SUT VLOC 180 0 9IN GS21 (SUTURE) IMPLANT
SYR 50ML LL SCALE MARK (SYRINGE) ×4 IMPLANT
SYR BULB IRRIGATION 50ML (SYRINGE) ×1 IMPLANT
SYSTEM CONVERTIBLE TROCAR (TROCAR) IMPLANT
TIP UTERINE 6.7X10CM GRN DISP (MISCELLANEOUS) ×1 IMPLANT
TOWEL OR 17X24 6PK STRL BLUE (TOWEL DISPOSABLE) ×8 IMPLANT
TROCAR DISP BLADELESS 8 DVNC (TROCAR) ×6 IMPLANT
TROCAR DISP BLADELESS 8MM (TROCAR) ×2
TROCAR XCEL NON-BLD 5MMX100MML (ENDOMECHANICALS) ×4 IMPLANT
TROCAR Z-THREAD 12X150 (TROCAR) ×4 IMPLANT
TUBING FILTER THERMOFLATOR (ELECTROSURGICAL) ×4 IMPLANT
WATER STERILE IRR 1000ML POUR (IV SOLUTION) ×12 IMPLANT

## 2012-03-07 NOTE — Anesthesia Preprocedure Evaluation (Addendum)
Anesthesia Evaluation  Patient identified by MRN, date of birth, ID band Patient awake    Reviewed: Allergy & Precautions, H&P , Patient's Chart, lab work & pertinent test results, reviewed documented beta blocker date and time   Airway Mallampati: II TM Distance: >3 FB Neck ROM: full    Dental No notable dental hx.    Pulmonary  breath sounds clear to auscultation  Pulmonary exam normal       Cardiovascular hypertension (EKG okay), On Medications Rhythm:regular Rate:Normal     Neuro/Psych    GI/Hepatic GERD- (diet related)  Controlled,  Endo/Other  Morbid obesity  Renal/GU      Musculoskeletal   Abdominal   Peds  Hematology  (+) anemia ,   Anesthesia Other Findings Blood transfusion for excessive Menstrual bleeding; not a post-op problem  Reproductive/Obstetrics                         Anesthesia Physical Anesthesia Plan  ASA: II  Anesthesia Plan: General   Post-op Pain Management:    Induction: Intravenous  Airway Management Planned: Oral ETT  Additional Equipment:   Intra-op Plan:   Post-operative Plan:   Informed Consent: I have reviewed the patients History and Physical, chart, labs and discussed the procedure including the risks, benefits and alternatives for the proposed anesthesia with the patient or authorized representative who has indicated his/her understanding and acceptance.   Dental Advisory Given  Plan Discussed with: CRNA and Surgeon  Anesthesia Plan Comments: (  Discussed  general anesthesia, including possible nausea, instrumentation of airway, sore throat,pulmonary aspiration, etc. I asked if the were any outstanding questions, or  concerns before we proceeded. )        Anesthesia Quick Evaluation

## 2012-03-07 NOTE — Op Note (Signed)
03/07/2012  10:11 AM  PATIENT:  Kristin Burnett  46 y.o. female  PRE-OPERATIVE DIAGNOSIS:  Menorrhagia Due to Uterine Fibroids  POST-OPERATIVE DIAGNOSIS:  Menorrhagia Due to Uterine Fibroids  PROCEDURE:  Procedure(s) (LRB) with comments: ROBOTIC ASSISTED TOTAL HYSTERECTOMY (N/A) BILATERAL SALPINGECTOMY (Bilateral) CYSTOSCOPY (N/A)  SURGEON:  Surgeon(s) and Role:    * [name], MD - Primary    * Allie Bossier, MD - Assisting   ANESTHESIA:   general  EBL:  Total I/O In: 1500 [I.V.:1500] Out: 250 [Urine:200; Blood:50]  BLOOD ADMINISTERED:none  DRAINS: none   LOCAL MEDICATIONS USED:  MARCAINE     SPECIMEN:  Source of Specimen:  uterus, cervix, fallopian tubes    DISPOSITION OF SPECIMEN:  PATHOLOGY  COUNTS:  YES  DICTATION: .Note written in EPIC  PLAN OF CARE: discharge to home after 6 hours observation  PATIENT DISPOSITION:  PACU - hemodynamically stable.   Delay start of Pharmacological VTE agent (>24hrs) due to surgical blood loss or risk of bleeding: yes  The risks, benefits, and alternatives of surgery were explained, understood, and accepted. Consents were signed. All questions were answered. She was taken to the operating room and general anesthesia was applied without complication. She was placed in the dorsal lithotomy position and her abdomen and vagina were prepped and draped after she had been carefully positioned on the table. A bimanual exam revealed a 16week size uterus that was mobile. Her adnexa were not enlarged. The cervix was measured and the uterus was sounded to 11 cm. A Rumi uterine manipulator was placed without difficulty. A Foley catheter was placed and it drained clear throughout the case. Gloves were changed and attention was turned to the abdomen. A small incision was made in the umbilicus and a Veress needle was placed intraperitoneally. CO2 was used to insufflate the abdomen to approximately 6 L. After good pneumoperitoneum was  established, a 12 mm trocar was placed approximately 10cm above the umbilicus.. Laparoscopy confirmed correct placement. She was placed in Trendelenburg position and ports were placed in appropriate positions on her abdomen to allow maximum exposure during the robotic case. Specifically there was an 5mm assistant port placed in the left upper quadrant under direct laproscopic visualization. Two 8 mm ports were placed 8-10cm lateral to the midline port.  These were all placed under direct laparoscopic visualization. The robot was docked and I proceeded with a robotic portion of the case.  The pelvis was inspected and the uterus, fallopian tubes and ovaries were found to be normal. The remainder of her pelvis appeared normal with the exception of adhesions of omentum to the anterior abdominal wall superior to the umbilicus.  I released the omentum using curved shears. The ureters and the infundibulopelvic ligaments were identified .The round ligament on each side was cauterized and cut. The PK/gyrus instrument was used for this portion. Th uteroovarian ligaments were identified, cauterized and ligated, a bladder flap was created anteriorly. The uterine vessels were identified and cauterized and then cut.The bladder was pushed out of the operative site and an anterior colpotomy was made. The colpotomy incision was extended circumferentially, following the blue outline of the Rumi manipulator. All pedicles were hemostatic. I excised the fallopian tubes bilaterally. The uterus was removed from the vagina with the fallopian tube segments. The vaginal cuff was closed with v-lock suture and on e midline figure of eight suture of 0 vicryl.  Excellent hemostasis was noted throughout. The pelvis was irrigated. The intraabdominal pressure was lowered assess hemostasis.  After determining excellent hemostasis, Floseal was placed on the surgical edges.  At this point, the robot was undocked. Cystoscopy was performed. The  cystoscopy revealed blue ejection of indigo carmine from both ureters.  The midline fascial incision was closed with 0 vicryl.  The skin from all of the other ports was closed with 4-0 monocryl. 20cc total  of 0.5% Marcaine was injected into all of the port sites.  Benzoin and steristrips were applied.  The patient was then extubated and taken to recovery in stable condition.  There were no complications. Sponge, lap and needle counts were correct x 2.  Ed Rayson L. Harraway-Wemhoff, M.D., Evern Core

## 2012-03-07 NOTE — Anesthesia Postprocedure Evaluation (Signed)
Anesthesia Post Note  Patient: Kristin Burnett  Procedure(s) Performed: Procedure(s) (LRB): ROBOTIC ASSISTED TOTAL HYSTERECTOMY (N/A) BILATERAL SALPINGECTOMY (Bilateral) CYSTOSCOPY (N/A)  Anesthesia type: General  Patient location: PACU  Post pain: Pain level controlled  Post assessment: Post-op Vital signs reviewed  Last Vitals:  Filed Vitals:   03/07/12 1145  BP: 134/70  Pulse: 68  Temp:   Resp: 16    Post vital signs: Reviewed  Level of consciousness: sedated  Complications: No apparent anesthesia complicationsfj

## 2012-03-07 NOTE — Addendum Note (Signed)
Addendum  created 03/07/12 1836 by Renford Dills, CRNA   Modules edited:Notes Section

## 2012-03-07 NOTE — Transfer of Care (Signed)
Immediate Anesthesia Transfer of Care Note  Patient: Kristin Burnett  Procedure(s) Performed: Procedure(s) (LRB) with comments: ROBOTIC ASSISTED TOTAL HYSTERECTOMY (N/A) BILATERAL SALPINGECTOMY (Bilateral) CYSTOSCOPY (N/A)  Patient Location: PACU  Anesthesia Type:General  Level of Consciousness: awake, alert  and oriented  Airway & Oxygen Therapy: Patient Spontanous Breathing and Patient connected to nasal cannula oxygen  Post-op Assessment: Report given to PACU RN and Post -op Vital signs reviewed and stable  Post vital signs: stable  Complications: No apparent anesthesia complications

## 2012-03-07 NOTE — H&P (Signed)
Patient ID: Kristin Burnett, female DOB: 03-21-1966, 46 y.o. MRN: [id number]  Kristin Burnett is an 46 y.o. female. P with h/o uterine fibroids and elevated BP. Pt is scheduled for a robot assisted TLH for symptomatic uterine fibroids. Pt c/o heavy bleeding and pain.  Pertinent Gynecological History:  Menses: flow is excessive with use of 6-8 pads or tampons on heaviest days  Bleeding: dysfunctional uterine bleeding  Contraception: tubal ligation  DES exposure: unknown  Blood transfusions: none  Sexually transmitted diseases: no past history  Previous GYN Procedures: endometrial ablation  Last mammogram: normal Date:newver had  Last pap: normal Date: summer 2013  OB History: G4, P3013  Menstrual History:  Menarche age: 21  No LMP recorded.  Past Medical History   Diagnosis  Date   .  Hypertension    .  Anemia     Past Surgical History   Procedure  Date   .  Tubal ligation    .  Laser ablation     Family History   Problem  Relation  Age of Onset   .  Hypertension  Mother    .  Arthritis  Mother    .  Anemia  Mother    .  Hypertension  Father    .  Diabetes  Father    .  Stroke  Father    .  Heart disease  Father    .  Heart disease  Brother    Social History: reports that she has been smoking Cigarettes. She has a 20 pack-year smoking history. She does not have any smokeless tobacco history on file. She reports that she drinks alcohol. She reports that she does not use illicit drugs.  Allergies: No Known Allergies  (Not in a hospital admission)  ROS    Physical Exam  BP 117/81  Pulse 62  Temp 97.9 F (36.6 C) (Oral)  Resp 18  SpO2 100%  Pt in NAD  Lungs: CTA  CV: RRR  Abd: obese, NT ND  GU:  EGBUS: no lesions  Vagina: no blood in vault  Cervix: no lesion; no mucopurulent d/c  Uterus: enlarged; poor decensus  Adnexa: no masses; non tender  04/27/10  FINAL DIAGNOSIS  1. Endometrium, curettage, : - ABUNDANT BLOOD AND BENIGN PROLIFERATIVE ENDOMETRIUM, NO ATYPIA,  HYPERPLASIA OR MALIGNANCY. - FRAGMENTS OF ENDOCERVICAL MUCOSA WITH NO DYSPLASIA OR MALIGNANCY.  Sono: 11/25/11  Findings:  Uterus: The uterus measures 12.9 x 7.7 x 7.2 cm. Multiple  fibroids are identified. There is a large heterogeneous submucosal  fibroid which arises from the posterior myometrium. This measures  4.5 x 4.4 x 4.0 9 cm. Two smaller fibroids are noted within the  anterior myometrium. These are intramural location.  Endometrium: The endometrium measures 13.6 mm.  Right ovary: Normal appearance/no adnexal mass  Left ovary: Not visualize.  Other Findings: Trace free fluid noted within the pelvis.  IMPRESSION:  1. Large submucosal fibroid is identified within the mid uterine  segment as before. This may account for the patient's vaginal  bleeding and pain.  No results found for this or any previous visit (from the past 24 hour(s)).  No results found.  Assessment/Plan:  Proceed with Robot assisted TLH with B salpingectomy   I reviewed with pt the risks including bleeding, infection, damage to surrounding organs and alternatives to surgery. We also Reviewed op instructions including that pt CANNOT be sexually active for 8 full weeks after surgery.  Pt to continue her current BP meds  Rec tobacco cessation  All question answered and surgery reviewed again  HARRAWAY-Hardwick, Deepa Barthel

## 2012-03-07 NOTE — Anesthesia Postprocedure Evaluation (Signed)
  Anesthesia Post-op Note  Patient: Kristin Burnett  Procedure(s) Performed: Procedure(s) (LRB) with comments: ROBOTIC ASSISTED TOTAL HYSTERECTOMY (N/A) BILATERAL SALPINGECTOMY (Bilateral) CYSTOSCOPY (N/A)  Patient Location: [facility]  Anesthesia Type:General  Level of Consciousness: awake  Airway and Oxygen Therapy: Patient Spontanous Breathing  Post-op Pain: mild  Post-op Assessment: Patient's Cardiovascular Status Stable and Respiratory Function Stable  Post-op Vital Signs: stable  Complications: No apparent anesthesia complications

## 2012-03-08 ENCOUNTER — Encounter (HOSPITAL_COMMUNITY): Payer: Self-pay | Admitting: Obstetrics & Gynecology

## 2012-03-25 ENCOUNTER — Ambulatory Visit (INDEPENDENT_AMBULATORY_CARE_PROVIDER_SITE_OTHER): Payer: BC Managed Care – PPO | Admitting: Obstetrics & Gynecology

## 2012-03-25 ENCOUNTER — Encounter: Payer: Self-pay | Admitting: Obstetrics & Gynecology

## 2012-03-25 VITALS — BP 133/88 | HR 61 | Temp 97.1°F | Resp 12 | Ht 64.0 in | Wt 244.0 lb

## 2012-03-25 DIAGNOSIS — D259 Leiomyoma of uterus, unspecified: Secondary | ICD-10-CM

## 2012-03-25 DIAGNOSIS — D219 Benign neoplasm of connective and other soft tissue, unspecified: Secondary | ICD-10-CM

## 2012-03-25 NOTE — Patient Instructions (Signed)
Outpatient Surgery Guidelines, Adult  These are general instructions for patients who will be going home the same day as the procedure (outpatient).  LET YOUR CAREGIVER KNOW ABOUT:   Allergies to food or medicine.   Medicines taken, including vitamins, herbs, eyedrops, over-the-counter medicines, and creams.   Use of steroids (by mouth or creams).   Previous problems with anesthetics or numbing medicines.   History of bleeding problems or blood clots.   Previous surgery.   Other health problems, including diabetes and kidney problems.   Possibility of pregnancy, if this applies.  RISKS AND COMPLICATIONS  Your caregiver will discuss possible risks and complications with you before surgery. Common risks and complications include:    Problems due to anesthesia.   Blood loss and replacement (does not apply to minor surgical procedures).   Temporary increase in pain due to surgery.   Uncorrected pain or problems the surgery was meant to correct.   Infection.   New damage.  BEFORE THE PROCEDURE   Stop taking herbal supplements 2 weeks prior to surgery.   Stop smoking at least 2 weeks prior to surgery. This lowers your risk for complications during and after surgery. Ask your caregiver for help with this if needed.   Do not take aspirin for 1 week prior to surgery unless instructed otherwise by your caregiver.   Do not take anti-inflammatory medicines (such as ibuprofen) for 48 hours prior to surgery.   The day before surgery, eat your usual meals and a light supper. Continue fluid intake. Do not drink alcohol.   Do not eat or drink after midnight before your surgery. Take your usual medicine the morning of surgery with a sip of water unless instructed otherwise. Check with your caregiver if you are unsure.    Arrange for someone to take you home from the hospital and to stay with you for 24 hours after the procedure. Medicine given for your procedure may prevent you from driving a car or caring for yourself.   Call your caregiver's office the morning prior to surgery if you develop an illness or problem which may prevent you from safely having your procedure.   You should be present 60 minutes prior to your procedure or as directed.  AFTER THE PROCEDURE  After surgery, you will be taken to the recovery area where a nurse will monitor your progress. When you are awake, stable, taking fluids well, and there are no complications, you will be allowed to go home. You may have numbness around the surgical site. Healing will take some time. You will have tenderness at the surgical site and there may be some swelling and bruising. You may have some nausea.  HOME CARE INSTRUCTIONS   Do not drive for 24 hours or as instructed by your caregiver. Do not drive while taking prescription pain medicines.   Do not drink alcohol for 24 hours.   Do not make important decisions or sign legal documents for 24 hours.   You may resume a normal diet and activities as directed.   Do not lift anything heavier than 10 pounds (4.5 kg) or play contact sports until your caregiver says it is okay.   Change your bandages (dressings) as directed.   Only take over-the-counter or prescription medicines for pain, discomfort, or fever as directed by your caregiver.   Keep all appointments as scheduled and follow all instructions.   Ask questions if you do not understand something.  SEEK MEDICAL CARE IF:   You   have increased bleeding (more than a small spot) from the surgical site.   You have redness, swelling, or increasing pain in the wound.   You see pus coming from the wound.   You have a fever.   You notice a bad smell coming from the wound or dressing.   You feel lightheaded or faint.  SEEK IMMEDIATE MEDICAL CARE IF:    You develop a rash.   You have trouble breathing.   You develop allergies.  Document Released: 01/03/2001 Document Revised: 07/03/2011 Document Reviewed: 11/21/2010  ExitCare Patient Information 2013 [employer].

## 2012-03-25 NOTE — Progress Notes (Signed)
Subjective:     Patient ID: Kristin Burnett, female   DOB: 1965/07/25, 46 y.o.   MRN: [id number]  HPI Pt s/p Robot assisted TLH on 03/07/12.  Pt reports no complaints. Off all pain meds for 1 week.  She is tolerating regular diet.  Having normal BM now.  Had some constipation immediately post surgery.  Has had 3 hot flushes since surgery.  Review of Systems     Objective:   Physical ExamPt in NAD BP 133/88  Pulse 61  Temp 97.1 F (36.2 C) (Oral)  Resp 12  Ht 5\' 4"  (1.626 m)  Wt 244 lb (110.678 kg)  BMI 41.88 kg/m2  LMP 02/29/2012  Abd: obese, NT, ND.  Port sites healing well.  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 - WEAKLY PROLIFERATIVE/INACTIVE ENDOMETRIUM; NEGATIVE FOR HYPERPLASIA OR MALIGNANCY. - UTERINE ADENOMYOSIS. - BENIGN CERVICAL MUCOSA; NEGATIVE FOR INTRAEPITHELIAL LESION OR MALIGNANCY. - BENIGN RIGHT AND LEFT FALLOPIAN TUBES; NO ATYPIA OR MALIGNANCY PRESENT Assessment:     2 week post op check in pt with no complaints    Plan:     F/u at 6-8 weeks May RTW in 1 week No lifting >20# for the full 6 weeks NOTHING PER VAGINA for a full 8 weeks  Lucas Exline L. Harraway-Mccann, M.D., [place]

## 2012-04-22 ENCOUNTER — Encounter: Payer: Self-pay | Admitting: Obstetrics & Gynecology

## 2012-04-22 ENCOUNTER — Ambulatory Visit (INDEPENDENT_AMBULATORY_CARE_PROVIDER_SITE_OTHER): Payer: BC Managed Care – PPO | Admitting: Obstetrics & Gynecology

## 2012-04-22 VITALS — BP 175/107 | HR 83 | Temp 97.0°F | Ht 64.0 in | Wt 249.7 lb

## 2012-04-22 DIAGNOSIS — D219 Benign neoplasm of connective and other soft tissue, unspecified: Secondary | ICD-10-CM

## 2012-04-22 DIAGNOSIS — D259 Leiomyoma of uterus, unspecified: Secondary | ICD-10-CM

## 2012-04-22 DIAGNOSIS — N920 Excessive and frequent menstruation with regular cycle: Secondary | ICD-10-CM

## 2012-04-22 DIAGNOSIS — Z09 Encounter for follow-up examination after completed treatment for conditions other than malignant neoplasm: Secondary | ICD-10-CM

## 2012-04-22 NOTE — Progress Notes (Signed)
Subjective:     Patient ID: Kristin Burnett, female   DOB: 02-10-1966, 46 y.o.   MRN: [id number]  HPI Pt s/p robot assisted TLH on 03/07/12.  Pt denies problems.  She reports to being back at work.  Tolerating regular diet.  Pt denies being sexually active since PRIOR to surgery.  She denies bleeding or abnormal discharge     Review of Systems     Objective:   Physical ExamBP 175/107  Pulse 83  Temp 97 F (36.1 C) (Oral)  Ht 5\' 4"  (1.626 m)  Wt 249 lb 11.2 oz (113.263 kg)  BMI 42.86 kg/m2  LMP 02/29/2012  Lungs: CTA CV: RRR Abd: soft, NT, ND, obese GU: EGBUS: no lesions Vagina: no blood in vault Cuff healing well; suture noted at cuff no granulation tissue or erythema noted       Assessment:     6 week post op from [facility]- doing well    Plan:     F/u with primary care physician re BP elevation F/u 3months or sooner prn May begin intercourse AFTER 8 weeks post op  Hearl Heikes L. Harraway-Gude, M.D., [place]

## 2012-04-22 NOTE — Patient Instructions (Addendum)
Hysterectomy  Care After  Refer to this sheet in the next few weeks. These instructions provide you with information on caring for yourself after your procedure. Your caregiver may also give you more specific instructions. Your treatment has been planned according to current medical practices, but problems sometimes occur. Call your caregiver if you have any problems or questions after your procedure.  HOME CARE INSTRUCTIONS   Healing will take time. You may have discomfort, tenderness, swelling, and bruising at the surgical site for about 2 weeks. This is normal and will get better as time goes on.   Only take over-the-counter or prescription medicines for pain, discomfort, or fever as directed by your caregiver.   Do not take aspirin. It can cause bleeding.   Do not drive when taking pain medicine.   Follow your caregiver's advice regarding exercise, lifting, driving, and general activities.   Resume your usual diet as directed and allowed.   Get plenty of rest and sleep.   Do not douche, use tampons, or have sexual intercourse for at least 6 weeks or until your caregiver gives you permission.   Change your bandages (dressings) as directed by your caregiver.   Monitor your temperature.   Take showers instead of baths for 2 to 3 weeks.   Do not drink alcohol until your caregiver gives you permission.   If you are constipated, you may take a mild laxative with your caregiver's permission. Bran foods may help with constipation problems. Drinking enough fluids to keep your urine clear or pale yellow may help as well.   Try to have someone home with you for 1 or 2 weeks to help around the house.   Keep all of your follow-up appointments as directed by your caregiver.  SEEK MEDICAL CARE IF:    You have swelling, redness, or increasing pain in the surgical cut (incision) area.   You have pus coming from the incision.   You notice a bad smell coming from the incision or dressing.   You have swelling,  redness, or pain around the intravenous (IV) site.   Your incision breaks open.   You feel dizzy or lightheaded.   You have pain or bleeding when you urinate.   You have persistent diarrhea.   You have persistent nausea and vomiting.   You have abnormal vaginal discharge.   You have a rash.   You have any type of abnormal reaction or develop an allergy to your medicine.   Your pain is not controlled with your prescribed medicine.  SEEK IMMEDIATE MEDICAL CARE IF:    You have a fever.   You have severe abdominal pain.   You have chest pain.   You have shortness of breath.   You faint.   You have pain, swelling, or redness of your leg.   You have heavy vaginal bleeding with blood clots.  MAKE SURE YOU:   Understand these instructions.   Will watch your condition.   Will get help right away if you are not doing well or get worse.  Document Released: 10/28/2004 Document Revised: 07/03/2011 Document Reviewed: 11/25/2010  ExitCare Patient Information 2013 [employer].

## 2012-05-24 ENCOUNTER — Encounter: Payer: Self-pay | Admitting: Internal Medicine

## 2012-05-24 ENCOUNTER — Ambulatory Visit (INDEPENDENT_AMBULATORY_CARE_PROVIDER_SITE_OTHER): Payer: BC Managed Care – PPO | Admitting: Internal Medicine

## 2012-05-24 VITALS — BP 126/82 | HR 66 | Temp 98.3°F | Wt 261.0 lb

## 2012-05-24 DIAGNOSIS — J40 Bronchitis, not specified as acute or chronic: Secondary | ICD-10-CM | POA: Insufficient documentation

## 2012-05-24 MED ORDER — ALBUTEROL SULFATE HFA 108 (90 BASE) MCG/ACT IN AERS: 2.0000 | INHALATION_SPRAY | Freq: Four times a day (QID) | RESPIRATORY_TRACT | Status: DC | PRN

## 2012-05-24 MED ORDER — DOXYCYCLINE HYCLATE 100 MG PO TABS: 100.0000 mg | ORAL_TABLET | Freq: Two times a day (BID) | ORAL | Status: DC

## 2012-05-24 NOTE — Patient Instructions (Addendum)
Get a chest XR at [facility] [place], corner of [address] (10 minutes form here); they are open 24/7 [address]  [place], [place] [address] [phone] --- Rest, fluids , tylenol For cough, take Mucinex DM twice a day as needed  If tightness or chest congestion, use albuterol 4 times a day. Take the antibiotic as prescribed  (doxycycline) Call if no better in few days Call anytime if the symptoms are severe

## 2012-05-24 NOTE — Progress Notes (Signed)
  Subjective:    Patient ID: Kristin Burnett, female    DOB: 05/17/1965, 47 y.o.   MRN: [id number]  HPI Acute visit Sx x 2 weeks: Sinus congestion, postnasal dripping, sneezing frequently. She also has cough and feels her chest is tight and congested. Has not use any meds so far.   Past Medical History  Diagnosis Date  . Hypertension   . DUB (dysfunctional uterine bleeding)   . History of blood transfusion 2011    [facility]  . GERD (gastroesophageal reflux disease)     otc tums     Past Surgical History  Procedure Date  . Tubal ligation   . Laser ablation   . Robotic assisted total hysterectomy 03/07/2012    Procedure: ROBOTIC ASSISTED TOTAL HYSTERECTOMY;  Surgeon: [name], MD;  Location: [facility];  Service: Gynecology;  Laterality: N/A;  . Bilateral salpingectomy 03/07/2012    Procedure: BILATERAL SALPINGECTOMY;  Surgeon: [name], MD;  Location: [facility];  Service: Gynecology;  Laterality: Bilateral;  . Cystoscopy 03/07/2012    Procedure: CYSTOSCOPY;  Surgeon: [name], MD;  Location: [facility];  Service: Gynecology;  Laterality: N/A;   History   Social History  . Marital Status: Divorced    Spouse Name: N/A    Number of Children: 3  . Years of Education: N/A   Occupational History  . bus driver    Social History Main Topics  . Smoking status: Current Every Day Smoker -- 1.0 packs/day for 20 years    Types: Cigarettes  . Smokeless tobacco: Never Used  . Alcohol Use: Yes     Comment: socially  . Drug Use: No  . Sexually Active: Not Currently    Birth Control/ Protection: Surgical   Other Topics Concern  . Not on file   Social History Narrative  . No narrative on file     Review of Systems No fever or chills. No chest pain ; lower extremity edema at baseline. Denies any nausea, vomiting, diarrhea. No myalgias. No history of asthma but she smokes a pack a day.     Objective:   Physical Exam General -- alert,  well-developed, and overweight appearing. No apparent distress.  HEENT -- TM R normal, and the left is obscured by wax  ;throat w/o redness, face symmetric and slightly tender to palpation at the back slightly and frontal sinuses, nose  congested   Lungs --  rhonchi bilaterally, slightly increased  expiratory time, some large airway congestion with cough   Heart-- normal rate, regular rhythm, no murmur, and no gallop.   Extremities--  Trace pretibial edema on the left, distal pretibial circumference is larger by half inch on the left, a chronic finding according to the patient.  psych-- Cognition and judgment appear intact. Alert and cooperative with normal attention span and concentration.  not anxious appearing and not depressed appearing.       Assessment & Plan:  Bronchitis, Heavy smoker with respiratory symptoms for the last 2 weeks. Bronchitis, rule out pneumonia. Plan: Antibiotics, smoke cessation discussed , chest x-ray Although she has not   wheezing at this time she reports some tightness in the chest. Will use albuterol as needed. See instructions

## 2012-05-26 ENCOUNTER — Encounter: Payer: Self-pay | Admitting: Internal Medicine

## 2012-09-10 ENCOUNTER — Encounter: Payer: Self-pay | Admitting: Internal Medicine

## 2012-09-19 ENCOUNTER — Ambulatory Visit (INDEPENDENT_AMBULATORY_CARE_PROVIDER_SITE_OTHER): Payer: BC Managed Care – PPO | Admitting: Internal Medicine

## 2012-09-19 ENCOUNTER — Encounter: Payer: Self-pay | Admitting: Internal Medicine

## 2012-09-19 VITALS — BP 124/84 | HR 66 | Temp 98.2°F | Ht 65.0 in | Wt 260.0 lb

## 2012-09-19 DIAGNOSIS — I10 Essential (primary) hypertension: Secondary | ICD-10-CM

## 2012-09-19 DIAGNOSIS — K219 Gastro-esophageal reflux disease without esophagitis: Secondary | ICD-10-CM | POA: Insufficient documentation

## 2012-09-19 DIAGNOSIS — N390 Urinary tract infection, site not specified: Secondary | ICD-10-CM

## 2012-09-19 DIAGNOSIS — R609 Edema, unspecified: Secondary | ICD-10-CM

## 2012-09-19 DIAGNOSIS — Z0000 Encounter for general adult medical examination without abnormal findings: Secondary | ICD-10-CM

## 2012-09-19 DIAGNOSIS — R600 Localized edema: Secondary | ICD-10-CM | POA: Insufficient documentation

## 2012-09-19 DIAGNOSIS — Z23 Encounter for immunization: Secondary | ICD-10-CM

## 2012-09-19 LAB — POCT URINALYSIS DIPSTICK
Ketones, UA: NEGATIVE
Protein, UA: 100
Spec Grav, UA: 1.02
pH, UA: 6

## 2012-09-19 MED ORDER — METOPROLOL TARTRATE 50 MG PO TABS: 50.0000 mg | ORAL_TABLET | Freq: Two times a day (BID) | ORAL | Status: DC

## 2012-09-19 MED ORDER — CIPROFLOXACIN HCL 500 MG PO TABS: 500.0000 mg | ORAL_TABLET | Freq: Two times a day (BID) | ORAL | Status: DC

## 2012-09-19 MED ORDER — LOSARTAN POTASSIUM-HCTZ 100-25 MG PO TABS: 1.0000 | ORAL_TABLET | Freq: Every day | ORAL | Status: DC

## 2012-09-19 MED ORDER — AMLODIPINE BESYLATE 5 MG PO TABS: 5.0000 mg | ORAL_TABLET | Freq: Every day | ORAL | Status: DC

## 2012-09-19 NOTE — Assessment & Plan Note (Signed)
Labs

## 2012-09-19 NOTE — Assessment & Plan Note (Addendum)
Last Td? Provided one today Never had a cscope  Sees gyn for female care  MMG (-) 2013 Tobacco abuse discussed, literature provided  Has a UTI, see Udip, UCX P, rx cipro

## 2012-09-19 NOTE — Patient Instructions (Addendum)
Please come back fasting: FLP, CBC, TSH, CMP--- dx v70 ---- Check the  blood pressure 2 or 3 times a week, be sure it is between 110/60 and 140/85. If it is consistently higher or lower, let me know ---- Exercise 3 hours a week. [employer]? Weight Watchers? ---- Please very carefully information about quitting tobacco ---- Acid reflux: [please red precautions, take Prilosec OTC 1 tablet before breakfast every day. --- Take the antibiotic as prescribed for a UTI. Call if not better in the next few days. --- Next visit in 4 months

## 2012-09-19 NOTE — Assessment & Plan Note (Addendum)
Chronic asymmetric edema, on chart review, in the last couple of years she had pelvic imaging with no malignancy. Plan: Observation, consider  discontinue amlodipine --- Exam today 09-19-12: L calf is half inch larger than R Distal from the calves, left leg is 1 inch larger.

## 2012-09-19 NOTE — Progress Notes (Signed)
Subjective:    Patient ID: Kristin Burnett, female    DOB: 1966/02/06, 47 y.o.   MRN: [id number]  HPI Complete physical exam. One-week history of dysuria, no difficulty urinating or blood in the urine. No fever chills. Took some UTI OTC medication without much relief. Also, I note L>R leg swelling, she reports this is a chronic problem.Chart reviewed, in the last few years  had a  Pelvic ultrasound and a pelvic  MRI with no malignancy noted. Reports GERD is getting more frequent, often times feels burning in the chest when she lays down. Hypertension, not ambulatory BPs, good medication compliance. BP today is great.  Past Medical History  Diagnosis Date  . Hypertension   . DUB (dysfunctional uterine bleeding)   . History of blood transfusion 2011    [facility]  . GERD (gastroesophageal reflux disease)     otc tums   Past Surgical History  Procedure Laterality Date  . Tubal ligation    . Laser ablation    . Robotic assisted total hysterectomy  03/07/2012    Procedure: ROBOTIC ASSISTED TOTAL HYSTERECTOMY;  Surgeon: [name], MD;  Location: [facility];  Service: Gynecology;  Laterality: N/A;  . Bilateral salpingectomy  03/07/2012    Procedure: BILATERAL SALPINGECTOMY;  Surgeon: [name], MD;  Location: [facility];  Service: Gynecology;  Laterality: Bilateral;  . Cystoscopy  03/07/2012    Procedure: CYSTOSCOPY;  Surgeon: [name], MD;  Location: [facility];  Service: Gynecology;  Laterality: N/A;   History   Social History  . Marital Status: Divorced    Spouse Name: N/A    Number of Children: 3  . Years of Education: N/A   Occupational History  . bus driver    Social History Main Topics  . Smoking status: Current Every Day Smoker -- 1.00 packs/day for 20 years    Types: Cigarettes  . Smokeless tobacco: Never Used  . Alcohol Use: Yes     Comment: socially  . Drug Use: No  . Sexually Active: Not Currently    Birth Control/ Protection:  Surgical   Other Topics Concern  . Not on file   Social History Narrative   Household: pt and 2 children   Family History  Problem Relation Age of Onset  . Hypertension Mother     Pamala Hurry  . Arthritis Mother   . Anemia Mother   . Diabetes Father   . Stroke Father 35  . Heart disease Father     CHF, has a defib  . Heart disease Brother     CHF  . Colon cancer Neg Hx   . Breast cancer Neg Hx   . Cancer Other     MGM "blood cancer"  . Cancer Mother     uterine?    Review of Systems Diet, has been trying to drink less sodas, portion control is not an issue according to the patient. Exercise, nothing routine but is trying to do more. No chest pain or shortness or breath No nausea, vomiting, diarrhea or blood in the stools.     Objective:   Physical Exam BP 124/84  Pulse 66  Temp(Src) 98.2 F (36.8 C) (Oral)  Ht 5\' 5"  (1.651 m)  Wt 260 lb (117.935 kg)  BMI 43.27 kg/m2  SpO2 97%  LMP 02/29/2012 General -- alert, well-developed, nad Neck --no thyromegaly Lungs -- normal respiratory effort, no intercostal retractions, no accessory muscle use, and normal breath sounds.   Heart-- normal rate, regular rhythm,  no murmur, and no gallop.   Abdomen--soft, non-tender, no distention, no masses, no HSM, no guarding, and no rigidity.   Extremities--  +/+++ pitting pretibial edema slightly worse on the left. L calf is half inch larger than R Distal from the calves, left leg is 1 inch larger. Normal femoral pulses Some varicose veins w/o phlebitis  Neurologic-- alert & oriented X3 and strength normal in all extremities. Psych-- Cognition and judgment appear intact. Alert and cooperative with normal attention span and concentration.  not anxious appearing and not depressed appearing.       Assessment & Plan:  In addition to her CPX we also discussed hypertension, GERD, edema

## 2012-09-19 NOTE — Assessment & Plan Note (Signed)
See above, increased GERD symptoms, precautions discussed. Recommend Prilosec OTC, see instructions.

## 2012-09-23 ENCOUNTER — Other Ambulatory Visit: Payer: BC Managed Care – PPO

## 2013-01-09 ENCOUNTER — Encounter: Payer: Self-pay | Admitting: Family Medicine

## 2013-01-09 ENCOUNTER — Ambulatory Visit (INDEPENDENT_AMBULATORY_CARE_PROVIDER_SITE_OTHER): Payer: BC Managed Care – PPO | Admitting: Family Medicine

## 2013-01-09 VITALS — BP 120/82 | HR 60 | Temp 98.5°F | Resp 12 | Wt 251.0 lb

## 2013-01-09 DIAGNOSIS — Z9109 Other allergy status, other than to drugs and biological substances: Secondary | ICD-10-CM

## 2013-01-09 DIAGNOSIS — J9801 Acute bronchospasm: Secondary | ICD-10-CM

## 2013-01-09 HISTORY — DX: Acute bronchospasm: J98.01

## 2013-01-09 MED ORDER — ALBUTEROL SULFATE HFA 108 (90 BASE) MCG/ACT IN AERS: 2.0000 | INHALATION_SPRAY | Freq: Four times a day (QID) | RESPIRATORY_TRACT | Status: DC | PRN

## 2013-01-09 MED ORDER — BECLOMETHASONE DIPROPIONATE 40 MCG/ACT IN AERS: 2.0000 | INHALATION_SPRAY | Freq: Two times a day (BID) | RESPIRATORY_TRACT | Status: DC

## 2013-01-09 NOTE — Progress Notes (Signed)
  Subjective:    Patient ID: Kristin Burnett, female    DOB: 09/13/1965, 47 y.o.   MRN: [id number]  HPI Pt here c/o inc breathing problems since being in this apt.  Her son is having trouble too.  She wants to know what to do.    Review of Systems    as above Objective:   Physical Exam  BP 120/82  Pulse 60  Temp(Src) 98.5 F (36.9 C) (Oral)  Resp 12  Wt 251 lb (113.853 kg)  BMI 41.77 kg/m2  SpO2 97%  LMP 02/29/2012 General appearance: alert, cooperative, appears stated age and no distress Neck: no adenopathy, supple, symmetrical, trachea midline and thyroid not enlarged, symmetric, no tenderness/mass/nodules Lungs: diminished breath sounds bilaterally Heart: S1, S2 normal       Assessment & Plan:

## 2013-01-09 NOTE — Assessment & Plan Note (Signed)
proair  dulera Recommend pt call epa for advice on what to do about possible mold in apt

## 2013-01-20 ENCOUNTER — Ambulatory Visit: Payer: BC Managed Care – PPO | Admitting: Internal Medicine

## 2013-01-20 DIAGNOSIS — Z0289 Encounter for other administrative examinations: Secondary | ICD-10-CM

## 2013-03-05 ENCOUNTER — Encounter: Payer: Self-pay | Admitting: Internal Medicine

## 2013-03-05 ENCOUNTER — Ambulatory Visit (INDEPENDENT_AMBULATORY_CARE_PROVIDER_SITE_OTHER): Payer: BC Managed Care – PPO | Admitting: Internal Medicine

## 2013-03-05 VITALS — BP 151/107 | HR 83 | Temp 99.1°F | Wt 257.0 lb

## 2013-03-05 DIAGNOSIS — F329 Major depressive disorder, single episode, unspecified: Secondary | ICD-10-CM

## 2013-03-05 DIAGNOSIS — I10 Essential (primary) hypertension: Secondary | ICD-10-CM

## 2013-03-05 DIAGNOSIS — Z0000 Encounter for general adult medical examination without abnormal findings: Secondary | ICD-10-CM

## 2013-03-05 DIAGNOSIS — F341 Dysthymic disorder: Secondary | ICD-10-CM

## 2013-03-05 DIAGNOSIS — J9801 Acute bronchospasm: Secondary | ICD-10-CM

## 2013-03-05 MED ORDER — AMOXICILLIN 500 MG PO CAPS: 1000.0000 mg | ORAL_CAPSULE | Freq: Two times a day (BID) | ORAL | Status: DC

## 2013-03-05 MED ORDER — AZELASTINE HCL 0.1 % NA SOLN: 2.0000 | Freq: Every evening | NASAL | Status: DC | PRN

## 2013-03-05 NOTE — Patient Instructions (Signed)
Schedule the blood work before you leave : FLP, CBC, TSH, CMP--- dx v70  Next visit in  4 months for a   follow up --- Hypertension. No Fasting Please make an appointment    Check the  blood pressure   Weekly  be sure it is between 110/60 and 140/85. Ideal blood pressure is 120/80. If it is consistently higher or lower, let me know  Rest, fluids , tylenol For cough, take Mucinex DM twice a day as needed  For congestion use astelin   until you feel better Take the antibiotic as prescribed  (Amoxicillin) if no better in few days  Call anytime if the symptoms are severe or persistent   I also recommend to see one of our counselors here in the office

## 2013-03-05 NOTE — Assessment & Plan Note (Signed)
Recommend a flu shot, declined, benefits discussed. Never got to do  the labs from last visit --->  see instructions

## 2013-03-05 NOTE — Assessment & Plan Note (Addendum)
Having a lot of issues recently of her life, PHQ 9 scored 10. Denies suicidal ideas. Patient is counseled, recommend to see one of our counselors. We also talked possibly on medication such as an SSRI, patient  states is not ready for that but she will let me know if the need arises.

## 2013-03-05 NOTE — Progress Notes (Signed)
Pre visit review using our clinic review tool, if applicable. No additional management support is needed unless otherwise documented below in the visit note. 

## 2013-03-05 NOTE — Progress Notes (Signed)
  Subjective:    Patient ID: Kristin Burnett, female    DOB: 1965/08/24, 47 y.o.   MRN: [id number]  HPI Acute visit  Developed upper respiratory symptoms 6 days ago: Postnasal dripping, worse at night, it creates cough and make it difficult to sleep. Some sneezing. Has not taken any specific medication.  Also like to discuss stress: Several things going on in his life including issues with her former boyfriend, his father has been in the hospital several times, has some problems with her son. Has both  anxiety and depression, denies any suicidal ideas . Hypertension-- BP today is elevated however at she checks her BP at home and is usually much better.  Past Medical History  Diagnosis Date  . Hypertension   . DUB (dysfunctional uterine bleeding)   . History of blood transfusion 2011    [facility]  . GERD (gastroesophageal reflux disease)     otc tums   Past Surgical History  Procedure Laterality Date  . Tubal ligation    . Laser ablation    . Robotic assisted total hysterectomy  03/07/2012    Procedure: ROBOTIC ASSISTED TOTAL HYSTERECTOMY;  Surgeon: [name], MD;  Location: [facility];  Service: Gynecology;  Laterality: N/A;  . Bilateral salpingectomy  03/07/2012    Procedure: BILATERAL SALPINGECTOMY;  Surgeon: [name], MD;  Location: [facility];  Service: Gynecology;  Laterality: Bilateral;  . Cystoscopy  03/07/2012    Procedure: CYSTOSCOPY;  Surgeon: [name], MD;  Location: [facility];  Service: Gynecology;  Laterality: N/A;    Review of Systems Denies fever chills No foreign trips No chest congestion, chest pain or shortness of breath. B-spasm is at baseline, reports she uses Qvar regularly and reaches for albuterol infrequently    Objective:   Physical Exam  BP 151/107  Pulse 83  Temp(Src) 99.1 F (37.3 C)  Wt 257 lb (116.574 kg)  SpO2 97%  LMP 02/29/2012 General -- alert, well-developed, NAD.  HEENT--   TMs normal, throat  symmetric, no redness or discharge. Face symmetric, sinuses not tender to palpation. Nose ++ congested. Lungs -- normal respiratory effort, no intercostal retractions, no accessory muscle use, and normal breath sounds.  Heart-- normal rate, regular rhythm, no murmur.   Neurologic--  alert & oriented X3. Speech normal, gait normal, strength normal in all extremities.  Psych-- Cognition and judgment appear intact. Cooperative with normal attention span and concentration. slt  anxious and depressed appearing.     Assessment & Plan:    Today , I spent more than  min with the patient, >50% of the time counseling

## 2013-03-05 NOTE — Assessment & Plan Note (Signed)
Good compliance with Qvar , not taking albuterol frequently. Also has had URI, see instructions

## 2013-03-05 NOTE — Assessment & Plan Note (Addendum)
elevated today but reports better ambulatory BPs, recommend to continue checking her BPs

## 2013-03-06 ENCOUNTER — Other Ambulatory Visit: Payer: BC Managed Care – PPO

## 2013-07-04 ENCOUNTER — Ambulatory Visit: Payer: BC Managed Care – PPO | Admitting: Internal Medicine

## 2013-07-04 DIAGNOSIS — Z0289 Encounter for other administrative examinations: Secondary | ICD-10-CM

## 2013-08-29 ENCOUNTER — Encounter: Payer: Self-pay | Admitting: Internal Medicine

## 2013-08-29 ENCOUNTER — Ambulatory Visit (INDEPENDENT_AMBULATORY_CARE_PROVIDER_SITE_OTHER): Payer: BC Managed Care – PPO | Admitting: Internal Medicine

## 2013-08-29 VITALS — BP 167/105 | HR 82 | Temp 98.1°F | Wt 269.6 lb

## 2013-08-29 DIAGNOSIS — I10 Essential (primary) hypertension: Secondary | ICD-10-CM

## 2013-08-29 DIAGNOSIS — R350 Frequency of micturition: Secondary | ICD-10-CM

## 2013-08-29 DIAGNOSIS — N390 Urinary tract infection, site not specified: Secondary | ICD-10-CM

## 2013-08-29 LAB — POCT URINALYSIS DIPSTICK
Bilirubin, UA: NEGATIVE
GLUCOSE UA: NEGATIVE
Ketones, UA: NEGATIVE
NITRITE UA: NEGATIVE
PH UA: 7.5
PROTEIN UA: NEGATIVE
SPEC GRAV UA: 1.01
UROBILINOGEN UA: 0.2

## 2013-08-29 LAB — BASIC METABOLIC PANEL
BUN: 12 mg/dL (ref 6–23)
CO2: 26 mEq/L (ref 19–32)
CREATININE: 1.3 mg/dL — AB (ref 0.4–1.2)
Calcium: 8.9 mg/dL (ref 8.4–10.5)
Chloride: 105 mEq/L (ref 96–112)
GFR: 56.79 mL/min — AB (ref >60.00)
GLUCOSE: 113 mg/dL — AB (ref 70–99)
POTASSIUM: 3.9 meq/L (ref 3.5–5.1)
Sodium: 136 mEq/L (ref 135–145)

## 2013-08-29 MED ORDER — CIPROFLOXACIN HCL 500 MG PO TABS: 500.0000 mg | ORAL_TABLET | Freq: Two times a day (BID) | ORAL | Status: DC

## 2013-08-29 NOTE — Assessment & Plan Note (Signed)
Symptoms suggestive of UTI, Udip supportive of the  diagnosis.  Plan: Urine culture, Cipro

## 2013-08-29 NOTE — Patient Instructions (Signed)
Get your blood work before you leave   Drink plenty of fluids Take the antibiotic as prescribed for 5 days Call if you have fever, chills or if you are not improving soon  Next visit is for a physical exam in 1-2 months , fasting Please make an appointment

## 2013-08-29 NOTE — Progress Notes (Signed)
Subjective:    Patient ID: Kristin Burnett, female    DOB: [birth year], 48 y.o.   MRN: [id number]  DOS:  08/29/2013 Type of  visit: Acute visit Symptoms started a week ago with dysuria, she has history of recurrent UTIs. Also her BP was noted to be elevated, see assessment and plan   ROS Denies fever or chills No abdominal pain or flank pain. No gross hematuria. No nausea or vomiting.  Past Medical History  Diagnosis Date  . Hypertension   . DUB (dysfunctional uterine bleeding)   . History of blood transfusion 2011    [facility]  . GERD (gastroesophageal reflux disease)     [facility]  . Recurrent UTI, h/o urethra diverticuli 10/13/2009    Qualifier: Diagnosis of  By: Stanford Scotland MD, Jiles Garter      Past Surgical History  Procedure Laterality Date  . Tubal ligation    . Laser ablation    . Robotic assisted total hysterectomy  03/07/2012    Procedure: ROBOTIC ASSISTED TOTAL HYSTERECTOMY;  Surgeon: Lavonia Drafts, MD;  Location: [facility];  Service: Gynecology;  Laterality: N/A;  . Bilateral salpingectomy  03/07/2012    Procedure: BILATERAL SALPINGECTOMY;  Surgeon: Lavonia Drafts, MD;  Location: [facility];  Service: Gynecology;  Laterality: Bilateral;  . Cystoscopy  03/07/2012    Procedure: CYSTOSCOPY;  Surgeon: Lavonia Drafts, MD;  Location: [facility];  Service: Gynecology;  Laterality: N/A;    History   Social History  . Marital Status: Divorced    Spouse Name: N/A    Number of Children: 3  . Years of Education: N/A   Occupational History  . bus driver    Social History Main Topics  . Smoking status: Current Every Day Smoker -- 1.00 packs/day for 20 years    Types: Cigarettes  . Smokeless tobacco: Never Used  . Alcohol Use: Yes     Comment: socially  . Drug Use: No  . Sexual Activity: Not Currently    Birth Control/ Protection: Surgical   Other Topics Concern  . Not on file   Social History Narrative   Household: pt and 2 children   Regular  exercise: was; not been in 1 mth   Caffeine use: coffee daily        Medication List       This list is accurate as of: 08/29/13 11:59 PM.  Always use your most recent med list.               albuterol 108 (90 BASE) MCG/ACT inhaler  Commonly known as:  VENTOLIN HFA  Inhale 2 puffs into the lungs every 6 (six) hours as needed for wheezing.     amLODipine 5 MG tablet  Commonly known as:  NORVASC  Take 1 tablet (5 mg total) by mouth daily.     azelastine 0.1 % nasal spray  Commonly known as:  ASTELIN  Place 2 sprays into both nostrils at bedtime as needed for rhinitis.     beclomethasone 40 MCG/ACT inhaler  Commonly known as:  QVAR  Inhale 2 puffs into the lungs 2 (two) times daily.     ciprofloxacin 500 MG tablet  Commonly known as:  CIPRO  Take 1 tablet (500 mg total) by mouth 2 (two) times daily.     losartan-hydrochlorothiazide 100-25 MG per tablet  Commonly known as:  HYZAAR  Take 1 tablet by mouth daily.     metoprolol 50 MG tablet  Commonly known as:  LOPRESSOR  Take 1 tablet (50 mg total) by mouth 2 (two) times daily.     naproxen sodium 220 MG tablet  Commonly known as:  ANAPROX  Take 440 mg by mouth 2 (two) times daily with a meal.     omeprazole 20 MG tablet  Commonly known as:  PRILOSEC OTC  Take 20 mg by mouth daily.     TYLENOL EX ST ARTHRITIS PAIN PO  Take 1-2 tablets by mouth 2 (two) times daily as needed. For pain           Objective:   Physical Exam BP 167/105  Pulse 82  Temp(Src) 98.1 F (36.7 C) (Oral)  Wt 269 lb 9.6 oz (122.29 kg)  SpO2 97%  LMP 02/29/2012 General -- alert, well-developed, NAD.   Abdomen-- Not distended, good bowel sounds,soft, mild lower abd tenderness B . No rebound or rigidity. No mass,organomegaly. No CVA tenderness  Neurologic--  alert & oriented X3. Speech normal, gait normal, strength normal in all extremities.  Psych-- Cognition and judgment appear intact. Cooperative with normal attention span and  concentration. No anxious or depressed appearing.        Assessment & Plan:

## 2013-08-29 NOTE — Assessment & Plan Note (Signed)
BP today 167/105, at home she assures me her blood pressure is around 130/70. Good compliance with medications except for the last 2 days, she already refill them this morning. Recheck on return to the office.

## 2013-08-30 ENCOUNTER — Telehealth: Payer: Self-pay | Admitting: Internal Medicine

## 2013-08-30 NOTE — Telephone Encounter (Signed)
Relevant patient education assigned to patient using Emmi. ° °

## 2013-09-01 NOTE — Addendum Note (Signed)
Addended by: Harl Bowie on: 09/01/2013 01:21 PM   Modules accepted: Orders

## 2013-09-03 LAB — URINE CULTURE: Colony Count: >=100000

## 2013-09-05 ENCOUNTER — Encounter: Payer: Self-pay | Admitting: *Deleted

## 2013-09-28 ENCOUNTER — Other Ambulatory Visit: Payer: Self-pay | Admitting: Internal Medicine

## 2013-10-20 ENCOUNTER — Ambulatory Visit (INDEPENDENT_AMBULATORY_CARE_PROVIDER_SITE_OTHER): Payer: BC Managed Care – PPO | Admitting: Internal Medicine

## 2013-10-20 ENCOUNTER — Encounter: Payer: Self-pay | Admitting: Internal Medicine

## 2013-10-20 VITALS — BP 129/85 | HR 62 | Temp 98.5°F | Wt 262.0 lb

## 2013-10-20 DIAGNOSIS — I808 Phlebitis and thrombophlebitis of other sites: Secondary | ICD-10-CM

## 2013-10-20 DIAGNOSIS — N390 Urinary tract infection, site not specified: Secondary | ICD-10-CM

## 2013-10-20 NOTE — Progress Notes (Signed)
Subjective:    Patient ID: Kristin Burnett, female    DOB: 1965/12/10, 48 y.o.   MRN: [id number]  DOS:  10/20/2013 Type of  Visit: acute History: One-week history of right arm pain: Pain is around the elbow, tender to touch, mild swelling ?, no redness   ROS Denies fever chills No chest pain or difficulty breathing. Lower extremity at baseline. No other injury, no neck pain per se. Not taking any new medications. Not on HRT, patient is a smoker. No recent prolonged car trips or airplane trips  Past Medical History  Diagnosis Date  . Hypertension   . DUB (dysfunctional uterine bleeding)   . History of blood transfusion 2011    [facility]  . GERD (gastroesophageal reflux disease)     otc tums  . Recurrent UTI, h/o urethra diverticuli 10/13/2009    Qualifier: Diagnosis of  By: Stanford Scotland MD, Jiles Garter      Past Surgical History  Procedure Laterality Date  . Tubal ligation    . Laser ablation    . Robotic assisted total hysterectomy  03/07/2012    Procedure: ROBOTIC ASSISTED TOTAL HYSTERECTOMY;  Surgeon: Lavonia Drafts, MD;  Location: [facility];  Service: Gynecology;  Laterality: N/A;  . Bilateral salpingectomy  03/07/2012    Procedure: BILATERAL SALPINGECTOMY;  Surgeon: Lavonia Drafts, MD;  Location: [place] [facility];  Service: Gynecology;  Laterality: Bilateral;  . Cystoscopy  03/07/2012    Procedure: CYSTOSCOPY;  Surgeon: Lavonia Drafts, MD;  Location: [place];  Service: Gynecology;  Laterality: N/A;    History   Social History  . Marital Status: Divorced    Spouse Name: N/A    Number of Children: 3  . Years of Education: N/A   Occupational History  . bus driver    Social History Main Topics  . Smoking status: Current Every Day Smoker -- 1.00 packs/day for 20 years    Types: Cigarettes  . Smokeless tobacco: Never Used  . Alcohol Use: Yes     Comment: socially  . Drug Use: No  . Sexual Activity: Not Currently    Birth Control/ Protection: Surgical     Other Topics Concern  . Not on file   Social History Narrative   Household: pt and 2 children   Regular exercise: was; not been in 1 mth   Caffeine use: coffee daily        Medication List       This list is accurate as of: 10/20/13  5:57 PM.  Always use your most recent med list.               albuterol 108 (90 BASE) MCG/ACT inhaler  Commonly known as:  VENTOLIN HFA  Inhale 2 puffs into the lungs every 6 (six) hours as needed for wheezing.     amLODipine 5 MG tablet  Commonly known as:  NORVASC  Take 1 tablet (5 mg total) by mouth daily.     azelastine 0.1 % nasal spray  Commonly known as:  ASTELIN  Place 2 sprays into both nostrils at bedtime as needed for rhinitis.     beclomethasone 40 MCG/ACT inhaler  Commonly known as:  QVAR  Inhale 2 puffs into the lungs 2 (two) times daily.     losartan-hydrochlorothiazide 100-25 MG per tablet  Commonly known as:  HYZAAR  TAKE ONE TABLET BY MOUTH ONCE DAILY     metoprolol 50 MG tablet  Commonly known as:  LOPRESSOR  Take 1 tablet (50 mg total)  by mouth 2 (two) times daily.     naproxen sodium 220 MG tablet  Commonly known as:  ANAPROX  Take 440 mg by mouth 2 (two) times daily with a meal.     omeprazole 20 MG tablet  Commonly known as:  PRILOSEC OTC  Take 20 mg by mouth daily.     TYLENOL EX ST ARTHRITIS PAIN PO  Take 1-2 tablets by mouth 2 (two) times daily as needed. For pain           Objective:   Physical Exam  Musculoskeletal:       Arms:  BP 129/85  Pulse 62  Temp(Src) 98.5 F (36.9 C) (Oral)  Wt 262 lb (118.842 kg)  SpO2 97%  LMP 02/29/2012 General -- alert, well-developed, NAD.  Neck --no mass or LAD HEENT-- Not pale.  Lungs -- normal respiratory effort, no intercostal retractions, no accessory muscle use, and normal breath sounds.  Heart-- normal rate, regular rhythm, no murmur.   Extremities-- no pretibial edema bilaterally , calves symmetric, no tender  Left upper extremity  normal Right upper extremity with no redness, frequency or actual swelling. She does have a slightly hard and tender vein. See graphic Neurologic--  alert & oriented X3. Speech normal, gait appropriate for age, strength symmetric and appropriate for age. Psych-- Cognition and judgment appear intact. Cooperative with normal attention span and concentration. No anxious or depressed appearing.        Assessment & Plan:   Superficial phlebitis New dx  Aspirin twice a day, warm  compress,   Ultrasound to r/o DVT

## 2013-10-20 NOTE — Progress Notes (Signed)
Pre-visit discussion using our clinic review tool. No additional management support is needed unless otherwise documented below in the visit note.  

## 2013-10-20 NOTE — Patient Instructions (Addendum)
Stop by the lab to provide a urine sample  Aspirin 325 mg one tablet twice a day for one week, take with food. Warm  compress to the right arm until better If not improving in the next few days please let me know

## 2013-10-20 NOTE — Assessment & Plan Note (Signed)
At the end of the visit, she reported ongoing  urinary symptoms, last urine culture inconclusive/contaminated. Abdomen is soft and nontender. Plan:   we'll get another UA and a urine culture. Eventually she will have to see urology

## 2013-10-22 ENCOUNTER — Other Ambulatory Visit: Payer: Self-pay | Admitting: Internal Medicine

## 2013-10-22 ENCOUNTER — Telehealth: Payer: Self-pay | Admitting: *Deleted

## 2013-10-22 DIAGNOSIS — M25521 Pain in right elbow: Secondary | ICD-10-CM

## 2013-10-22 LAB — URINE CULTURE
COLONY COUNT: NO GROWTH
Organism ID, Bacteria: NO GROWTH

## 2013-10-22 NOTE — Telephone Encounter (Signed)
Patient called back and was advised to go to any [facility] hospital for U/S. She will do that today. [facility] has limited U/S tech available due to holiday weekend approaching.

## 2013-10-22 NOTE — Telephone Encounter (Signed)
Received a call from [facility] imaging stating they could not do U/S until Monday. Advised that MD would prefer it be completed prior to then. Called pt to see if she is willing to go somewhere else for imaging. She can go to [facility] or any of the [facility] hospitals for this. Left message for the patient to return my call,

## 2013-10-23 ENCOUNTER — Ambulatory Visit (HOSPITAL_BASED_OUTPATIENT_CLINIC_OR_DEPARTMENT_OTHER): Payer: BC Managed Care – PPO

## 2013-10-23 ENCOUNTER — Emergency Department (HOSPITAL_COMMUNITY)
Admission: EM | Admit: 2013-10-23 | Discharge: 2013-10-23 | Disposition: A | Discharge status: Home / Self Care | Payer: BC Managed Care – PPO | Attending: Emergency Medicine | Admitting: Emergency Medicine

## 2013-10-23 ENCOUNTER — Encounter (HOSPITAL_COMMUNITY): Payer: Self-pay | Admitting: Emergency Medicine

## 2013-10-23 ENCOUNTER — Other Ambulatory Visit: Payer: Self-pay | Admitting: Internal Medicine

## 2013-10-23 DIAGNOSIS — I10 Essential (primary) hypertension: Secondary | ICD-10-CM | POA: Insufficient documentation

## 2013-10-23 DIAGNOSIS — M25529 Pain in unspecified elbow: Secondary | ICD-10-CM | POA: Insufficient documentation

## 2013-10-23 DIAGNOSIS — Z87440 Personal history of urinary (tract) infections: Secondary | ICD-10-CM | POA: Insufficient documentation

## 2013-10-23 DIAGNOSIS — F17200 Nicotine dependence, unspecified, uncomplicated: Secondary | ICD-10-CM | POA: Insufficient documentation

## 2013-10-23 DIAGNOSIS — M25521 Pain in right elbow: Secondary | ICD-10-CM

## 2013-10-23 DIAGNOSIS — Z8719 Personal history of other diseases of the digestive system: Secondary | ICD-10-CM | POA: Insufficient documentation

## 2013-10-23 DIAGNOSIS — M79609 Pain in unspecified limb: Secondary | ICD-10-CM

## 2013-10-23 DIAGNOSIS — Z79899 Other long term (current) drug therapy: Secondary | ICD-10-CM | POA: Insufficient documentation

## 2013-10-23 DIAGNOSIS — M7989 Other specified soft tissue disorders: Secondary | ICD-10-CM

## 2013-10-23 MED ORDER — NAPROXEN 500 MG PO TABS: 500.0000 mg | ORAL_TABLET | Freq: Two times a day (BID) | ORAL | Status: DC

## 2013-10-23 NOTE — ED Provider Notes (Signed)
Medical screening examination/treatment/procedure(s) were performed by non-physician practitioner and as supervising physician I was immediately available for consultation/collaboration.   EKG Interpretation None        Malvin Johns, MD 10/23/13 2318

## 2013-10-23 NOTE — ED Notes (Signed)
Resident at the bedside

## 2013-10-23 NOTE — ED Notes (Signed)
Patient returned from venous.

## 2013-10-23 NOTE — ED Notes (Signed)
Pt states that she awoke on 6/22 with soreness to right lower arm. Pt states "I can feel a knot in my elbow area." Pt saw PCP who sent pt here for doppler of arm to rule out DVT but has been unable get in to appointment. Pt reports intermittent throbbing to area, rates now 6/10. Pt denies SOB. NAD.

## 2013-10-23 NOTE — ED Provider Notes (Signed)
CSN: [id number]     Arrival date & time 10/23/13  1712 History   First MD Initiated Contact with Patient 10/23/13 1724     Chief Complaint  Patient presents with  . Extremity Pain     (Consider location/radiation/quality/duration/timing/severity/associated sxs/prior Treatment) HPI Kristin Burnett is a(n) 48 y.o. female who presents presents to the emergency department with chief complaint of right elbow pain. Patient she's has been ongoing for several days. She has intermittent achy and sharp pains in the elbow. The patient called her primary care physician who scheduled her for an outpatient ultrasound however there was some miscommunication the patient was unable to obtain the ultrasound to rule out DVT of the upper extremity last night. She came to emergency department today. She states that the past 2 days her pain has been. She states that it was worse about 3 days ago. The patient drives a bus for the city. She states that her pain seems to be worse at the end of her day. She does notice this area of swelling on the lateral side of the antecubital fossa. She denies any numbness, tingling, swelling of the wrists, decreased sensation or cold.  Past Medical History  Diagnosis Date  . Hypertension   . DUB (dysfunctional uterine bleeding)   . History of blood transfusion 2011    [facility]  . GERD (gastroesophageal reflux disease)     otc tums  . Recurrent UTI, h/o urethra diverticuli 10/13/2009    Qualifier: Diagnosis of  By: Stanford Scotland MD, Jiles Garter     Past Surgical History  Procedure Laterality Date  . Tubal ligation    . Laser ablation    . Robotic assisted total hysterectomy  03/07/2012    Procedure: ROBOTIC ASSISTED TOTAL HYSTERECTOMY;  Surgeon: Lavonia Drafts, MD;  Location: [place] [facility];  Service: Gynecology;  Laterality: N/A;  . Bilateral salpingectomy  03/07/2012    Procedure: BILATERAL SALPINGECTOMY;  Surgeon: Lavonia Drafts, MD;  Location: [facility];  Service:  Gynecology;  Laterality: Bilateral;  . Cystoscopy  03/07/2012    Procedure: CYSTOSCOPY;  Surgeon: Lavonia Drafts, MD;  Location: [facility];  Service: Gynecology;  Laterality: N/A;   Family History  Problem Relation Age of Onset  . Hypertension Mother     [place]  . Arthritis Mother   . Anemia Mother   . Diabetes Father   . Stroke Father 58  . Heart disease Father     CHF, has a defib  . Heart disease Brother     CHF  . Colon cancer Neg Hx   . Breast cancer Neg Hx   . Cancer Other     MGM "blood cancer"  . Cancer Mother     uterine?   History  Substance Use Topics  . Smoking status: Current Every Day Smoker -- 1.00 packs/day for 20 years    Types: Cigarettes  . Smokeless tobacco: Never Used  . Alcohol Use: Yes     Comment: socially   OB History   Grav Para Term Preterm Abortions TAB SAB Ect Mult Living   3 3 3       3      Review of Systems  Ten systems reviewed and are negative for acute change, except as noted in the HPI.    Allergies  Review of patient's allergies indicates no known allergies.  Home Medications   Prior to Admission medications   Medication Sig Start Date End Date Taking? Authorizing Provider  albuterol (VENTOLIN HFA) 108 (90  BASE) MCG/ACT inhaler Inhale 2 puffs into the lungs every 6 (six) hours as needed for wheezing. 01/09/13  Yes Yvonne R Lowne, DO  amLODipine (NORVASC) 5 MG tablet Take 1 tablet (5 mg total) by mouth daily. 09/19/12  Yes Colon Branch, MD  azelastine (ASTELIN) 137 MCG/SPRAY nasal spray Place 2 sprays into both nostrils at bedtime as needed for rhinitis. 03/05/13  Yes Colon Branch, MD  BIOTIN PO Take 1 tablet by mouth daily.   Yes Historical Provider, MD  losartan-hydrochlorothiazide (HYZAAR) 100-25 MG per tablet Take 1 tablet by mouth daily.   Yes Historical Provider, MD  metoprolol (LOPRESSOR) 50 MG tablet Take 1 tablet (50 mg total) by mouth 2 (two) times daily. 09/19/12  Yes Colon Branch, MD  aspirin 325 MG tablet Take 325 mg by  mouth once.    Historical Provider, MD   BP 131/83  Pulse 63  Temp(Src) 98.3 F (36.8 C) (Oral)  Resp 20  SpO2 97%  LMP 02/29/2012 Physical Exam  Nursing note and vitals reviewed. Constitutional: She is oriented to person, place, and time. She appears well-developed and well-nourished. No distress.  HENT:  Head: Normocephalic and atraumatic.  Eyes: Conjunctivae are normal. No scleral icterus.  Neck: Normal range of motion.  Cardiovascular: Normal rate, regular rhythm and normal heart sounds.  Exam reveals no gallop and no friction rub.   No murmur heard. Pulmonary/Chest: Effort normal and breath sounds normal. No respiratory distress.  Abdominal: Soft. Bowel sounds are normal. She exhibits no distension and no mass. There is no tenderness. There is no guarding.  Musculoskeletal:  A right elbow exam was performed. SKIN: intact SWELLING: minimal swelling in the lateral antecubital fossa EFFUSION: none WARMTH: no warmth TENDERNESS: none ROM: full STRENGTH: normal NEUROLOGICAL EXAM: normal VASCULAR EXAM: normal   Neurological: She is alert and oriented to person, place, and time.  Skin: Skin is warm and dry. She is not diaphoretic.  Psychiatric: She has a normal mood and affect. Her behavior is normal.    ED Course  Procedures (including critical care time) Labs Review Labs Reviewed - No data to display  Imaging Review No results found.   EKG Interpretation None      MDM   Final diagnoses:  Elbow pain, right    Negative DVT study.  No pain. FROM. Full strength,. NV intact/ Question distal biceps tendonitis. Advise cryotherapy and naproxen f/u with pcp Discussed return precautions   Margarita Mail, PA-C 10/23/13 1935

## 2013-10-23 NOTE — Discharge Instructions (Signed)
Cryotherapy Cryotherapy means treatment with cold. Ice or gel packs can be used to reduce both pain and swelling. Ice is the most helpful within the first 24 to 48 hours after an injury or flareup from overusing a muscle or joint. Sprains, strains, spasms, burning pain, shooting pain, and aches can all be eased with ice. Ice can also be used when recovering from surgery. Ice is effective, has very few side effects, and is safe for most people to use. PRECAUTIONS  Ice is not a safe treatment option for people with:  Raynaud's phenomenon. This is a condition affecting small blood vessels in the extremities. Exposure to cold may cause your problems to return.  Cold hypersensitivity. There are many forms of cold hypersensitivity, including:  Cold urticaria. Red, itchy hives appear on the skin when the tissues begin to warm after being iced.  Cold erythema. This is a red, itchy rash caused by exposure to cold.  Cold hemoglobinuria. Red blood cells break down when the tissues begin to warm after being iced. The hemoglobin that carry oxygen are passed into the urine because they cannot combine with blood proteins fast enough.  Numbness or altered sensitivity in the area being iced. If you have any of the following conditions, do not use ice until you have discussed cryotherapy with your caregiver:  Heart conditions, such as arrhythmia, angina, or chronic heart disease.  High blood pressure.  Healing wounds or open skin in the area being iced.  Current infections.  Rheumatoid arthritis.  Poor circulation.  Diabetes. Ice slows the blood flow in the region it is applied. This is beneficial when trying to stop inflamed tissues from spreading irritating chemicals to surrounding tissues. However, if you expose your skin to cold temperatures for too long or without the proper protection, you can damage your skin or nerves. Watch for signs of skin damage due to cold. HOME CARE INSTRUCTIONS Follow  these tips to use ice and cold packs safely.  Place a dry or damp towel between the ice and skin. A damp towel will cool the skin more quickly, so you may need to shorten the time that the ice is used.  For a more rapid response, add gentle compression to the ice.  Ice for no more than 10 to 20 minutes at a time. The bonier the area you are icing, the less time it will take to get the benefits of ice.  Check your skin after 5 minutes to make sure there are no signs of a poor response to cold or skin damage.  Rest 20 minutes or more in between uses.  Once your skin is numb, you can end your treatment. You can test numbness by very lightly touching your skin. The touch should be so light that you do not see the skin dimple from the pressure of your fingertip. When using ice, most people will feel these normal sensations in this order: cold, burning, aching, and numbness.  Do not use ice on someone who cannot communicate their responses to pain, such as small children or people with dementia. HOW TO MAKE AN ICE PACK Ice packs are the most common way to use ice therapy. Other methods include ice massage, ice baths, and cryo-sprays. Muscle creams that cause a cold, tingly feeling do not offer the same benefits that ice offers and should not be used as a substitute unless recommended by your caregiver. To make an ice pack, do one of the following:  Place crushed ice or  a bag of frozen vegetables in a sealable plastic bag. Squeeze out the excess air. Place this bag inside another plastic bag. Slide the bag into a pillowcase or place a damp towel between your skin and the bag.  Mix 3 parts water with 1 part rubbing alcohol. Freeze the mixture in a sealable plastic bag. When you remove the mixture from the freezer, it will be slushy. Squeeze out the excess air. Place this bag inside another plastic bag. Slide the bag into a pillowcase or place a damp towel between your skin and the bag. SEEK MEDICAL  CARE IF:  You develop white spots on your skin. This may give the skin a blotchy (mottled) appearance.  Your skin turns blue or pale.  Your skin becomes waxy or hard.  Your swelling gets worse. MAKE SURE YOU:   Understand these instructions.  Will watch your condition.  Will get help right away if you are not doing well or get worse. Document Released: 12/05/2010 Document Revised: 07/03/2011 Document Reviewed: 12/05/2010 [facility] Patient Information 2015 [place], [place]. This information is not intended to replace advice given to you by your health care provider. Make sure you discuss any questions you have with your health care provider. Naproxen and naproxen sodium oral immediate-release tablets What is this medicine? NAPROXEN (na PROX en) is a non-steroidal anti-inflammatory drug (NSAID). It is used to reduce swelling and to treat pain. This medicine may be used for dental pain, headache, or painful monthly periods. It is also used for painful joint and muscular problems such as arthritis, tendinitis, bursitis, and gout. This medicine may be used for other purposes; ask your health care provider or pharmacist if you have questions. COMMON BRAND NAME(S): Aflaxen, Aleve, Aleve Arthritis, All Day Relief, Anaprox, Anaprox DS, Naprosyn What should I tell my health care provider before I take this medicine? They need to know if you have any of these conditions: -asthma -cigarette smoker -drink more than 3 alcohol containing drinks a day -heart disease or circulation problems such as heart failure or leg edema (fluid retention) -high blood pressure -kidney disease -liver disease -stomach bleeding or ulcers -an unusual or allergic reaction to naproxen, aspirin, other NSAIDs, other medicines, foods, dyes, or preservatives -pregnant or trying to get pregnant -breast-feeding How should I use this medicine? Take this medicine by mouth with a glass of water. Follow the directions on the  prescription label. Take it with food if your stomach gets upset. Try to not lie down for at least 10 minutes after you take it. Take your medicine at regular intervals. Do not take your medicine more often than directed. Long-term, continuous use may increase the risk of heart attack or stroke. A special MedGuide will be given to you by the pharmacist with each prescription and refill. Be sure to read this information carefully each time. Talk to your pediatrician regarding the use of this medicine in children. Special care may be needed. Overdosage: If you think you have taken too much of this medicine contact a poison control center or emergency room at once. NOTE: This medicine is only for you. Do not share this medicine with others. What if I miss a dose? If you miss a dose, take it as soon as you can. If it is almost time for your next dose, take only that dose. Do not take double or extra doses. What may interact with this medicine? -alcohol -aspirin -cidofovir -diuretics -lithium -methotrexate -other drugs for inflammation like ketorolac or prednisone -pemetrexed -  probenecid -warfarin This list may not describe all possible interactions. Give your health care provider a list of all the medicines, herbs, non-prescription drugs, or dietary supplements you use. Also tell them if you smoke, drink alcohol, or use illegal drugs. Some items may interact with your medicine. What should I watch for while using this medicine? Tell your doctor or health care professional if your pain does not get better. Talk to your doctor before taking another medicine for pain. Do not treat yourself. This medicine does not prevent heart attack or stroke. In fact, this medicine may increase the chance of a heart attack or stroke. The chance may increase with longer use of this medicine and in people who have heart disease. If you take aspirin to prevent heart attack or stroke, talk with your doctor or health care  professional. Do not take other medicines that contain aspirin, ibuprofen, or naproxen with this medicine. Side effects such as stomach upset, nausea, or ulcers may be more likely to occur. Many medicines available without a prescription should not be taken with this medicine. This medicine can cause ulcers and bleeding in the stomach and intestines at any time during treatment. Do not smoke cigarettes or drink alcohol. These increase irritation to your stomach and can make it more susceptible to damage from this medicine. Ulcers and bleeding can happen without warning symptoms and can cause death. You may get drowsy or dizzy. Do not drive, use machinery, or do anything that needs mental alertness until you know how this medicine affects you. Do not stand or sit up quickly, especially if you are an older patient. This reduces the risk of dizzy or fainting spells. This medicine can cause you to bleed more easily. Try to avoid damage to your teeth and gums when you brush or floss your teeth. What side effects may I notice from receiving this medicine? Side effects that you should report to your doctor or health care professional as soon as possible: -black or bloody stools, blood in the urine or vomit -blurred vision -chest pain -difficulty breathing or wheezing -nausea or vomiting -severe stomach pain -skin rash, skin redness, blistering or peeling skin, hives, or itching -slurred speech or weakness on one side of the body -swelling of eyelids, throat, lips -unexplained weight gain or swelling -unusually weak or tired -yellowing of eyes or skin Side effects that usually do not require medical attention (report to your doctor or health care professional if they continue or are bothersome): -constipation -headache -heartburn This list may not describe all possible side effects. Call your doctor for medical advice about side effects. You may report side effects to FDA at 1-800-FDA-1088. Where  should I keep my medicine? Keep out of the reach of children. Store at room temperature between 15 and 30 degrees C (59 and 86 degrees F). Keep container tightly closed. Throw away any unused medicine after the expiration date. NOTE: This sheet is a summary. It may not cover all possible information. If you have questions about this medicine, talk to your doctor, pharmacist, or health care provider.  2015, [employer]. (2009-04-12 20:10:16) Biceps Tendon Tendinitis (Distal) with Rehab Tendinitis involves inflammation and pain over the affected tendon. The distal biceps tendon (near the elbow) is vulnerable to tendinitis. Distal biceps tendonitis is usually due to the bony bump near the elbow (bicipital tuberosity) causing increased friction over the tendon. The biceps tendon attaches the biceps muscle to one bone in the elbow and two in the shoulder. It  is important for proper function of the elbow and for turning the palm upward (supination). SYMPTOMS   Pain, aching, tenderness, and sometimes warmth or redness over the front of the elbow.  Pain when bending the elbow or turning the palm up, using the wrist, especially if performed against resistance.  Crackling sound (crepitation) when the tendon or elbow is moved or touched. CAUSES  The symptoms of biceps tendonitis are due to inflammation of the tendon. Inflammation may be caused by:  Strain from sudden increase in amount or intensity of activity.  Direct blow or injury to the elbow (uncommon).  Overuse or repetitive elbow bending or wrist rotation, particularly when turning the palm up, or with elbow hyperextension. RISK INCREASES WITH:  Sports that involve contact or overhead arm activity (throwing sports, gymnastics, weightlifting, bodybuilding, rock climbing).  Heavy labor.  Poor strength and flexibility.  Failure to warm up properly before activity.  Injury to other structures of the elbow.  Restraint of the  elbow. PREVENTION  Warm up and stretch properly before activity.  Allow time for recovery between activities.  Maintain physical fitness:  Strength, flexibility, and endurance.  Cardiovascular fitness.  Learn and use proper exercise technique. PROGNOSIS  With proper treatment, biceps tendon tendonitis is usually curable within 6 weeks.  RELATED COMPLICATIONS   Longer healing time if not properly treated or if not given enough time to heal.  Chronically inflamed tendon that causes persistent pain with activity, that may progress to constant pain and potentially rupture of the tendon.  Recurring symptoms, especially if activity is resumed too soon, with overuse or with poor technique. TREATMENT  Treatment first involves ice and medicine to reduce pain and inflammation. Modify activities that cause pain, to reduce the chances of causing the condition to get worse. Strengthening and stretching exercises should be performed to promote proper use of the muscles of the elbow. These exercise may be performed at home or with a therapist. Other treatments may be given such as ultrasound or heat therapy. Surgery is usually not recommended.  MEDICATION  If pain medicine is needed, nonsteroidal anti-inflammatory medicines (aspirin and ibuprofen), or other minor pain relievers (acetaminophen), are often advised.  Do not take pain relieving medication for 7 days before surgery.  Prescription pain relievers may be given if your caregiver thinks they are needed. Use only as directed and only as much as you need. HEAT AND COLD:   Cold treatment (icing) should be applied for 10 to 15 minutes every 2 to 3 hours for inflammation and pain, and immediately after activity that aggravates your symptoms. Use ice packs or an ice massage.  Heat treatment may be used before performing stretching and strengthening activities prescribed by your caregiver, physical therapist, or athletic trainer. Use a heat pack  or a warm water soak. SEEK MEDICAL CARE IF:   Symptoms get worse or do not improve in 2 weeks, despite treatment.  New, unexplained symptoms develop. (Drugs used in treatment may produce side effects.) EXERCISES  RANGE OF MOTION (ROM) AND STRETCHING EXERCISES - Biceps Tendon Tendinitis (Distal) These exercises may help you when beginning to rehabilitate your injury. Your symptoms may go away with or without further involvement from your physician, physical therapist, or athletic trainer. While completing these exercises, remember:   Restoring tissue flexibility helps normal motion to return to the joints. This allows healthier, less painful movement and activity.  An effective stretch should be held for at least 30 seconds.  A stretch should never be  painful. You should only feel a gentle lengthening or release in the stretched tissue. STRETCH - Elbow Flexors   Lie on a firm bed or countertop on your back. Be sure that you are in a comfortable position which will allow you to relax your arm muscles.  Place a folded towel under your right / left upper arm, so that your elbow and shoulder are at the same height. Extend your arm; your elbow should not rest on the bed or towel.  Allow the weight of your hand to straighten your elbow. Keep your arm and chest muscles relaxed. Your caretaker may ask you to increase the intensity of your stretch by adding a small wrist or hand weight.  Hold for __________ seconds. You should feel a stretch on the inside of your elbow. Slowly return to the starting position. Repeat __________ times. Complete this exercise __________ times per day. RANGE OF MOTION - Supination, Active   Stand or sit with your elbows at your side. Bend your right / left elbow to 90 degrees.  Turn your palm upward until you feel a gentle stretch on the inside of your forearm.  Hold this position for __________ seconds. Slowly release and return to the starting position. Repeat  __________ times. Complete this stretch __________ times per day.  RANGE OF MOTION - Pronation, Active   Stand or sit with your elbows at your side. Bend your right / left elbow to 90 degrees.  Turn your palm downward until you feel a gentle stretch on the top of your forearm.  Hold this position for __________ seconds. Slowly release and return to the starting position. Repeat __________ times. Complete this stretch __________ times per day.  STRENGTHENING EXERCISES - Biceps Tendon Tendinitis (Distal) These exercises may help you when beginning to rehabilitate your injury. They may resolve your symptoms with or without further involvement from your physician, physical therapist or athletic trainer. While completing these exercises, remember:   Muscles can gain both the endurance and the strength needed for everyday activities through controlled exercises.  Complete these exercises as instructed by your physician, physical therapist or athletic trainer. Increase the resistance and repetitions only as guided.  You may experience muscle soreness or fatigue, but the pain or discomfort you are trying to eliminate should never get worse during these exercises. If this pain does get worse, stop and make sure you are following the directions exactly. If the pain is still present after adjustments, discontinue the exercise until you can discuss the trouble with your clinician. STRENGTH - Elbow Flexors, Isometric   Stand or sit upright on a firm surface. Place your right / left arm so that your hand is palm-up and at the height of your waist.  Place your opposite hand on top of your forearm. Gently push down as your right / left arm resists. Push as hard as you can with both arms without causing any pain or movement at your right / left elbow. Hold this stationary position for __________ seconds.  Gradually release the tension in both arms. Allow your muscles to relax completely before  repeating. Repeat __________ times. Complete this exercise __________ times per day. STRENGTH - Forearm Supinators   Sit with your right / left forearm supported on a table, keeping your elbow below shoulder height. Rest your hand over the edge, palm down.  Gently grip a hammer or a soup ladle.  Without moving your elbow, slowly turn your palm and hand upward to a "thumbs-up" position.  Hold this position for __________ seconds. Slowly return to the starting position. Repeat __________ times. Complete this exercise __________ times per day.  STRENGTH - Forearm Pronators   Sit with your right / left forearm supported on a table, keeping your elbow below shoulder height. Rest your hand over the edge, palm up.  Gently grip a hammer or a soup ladle.  Without moving your elbow, slowly turn your palm and hand upward to a "thumbs-up" position.  Hold this position for __________ seconds. Slowly return to the starting position. Repeat __________ times. Complete this exercise __________ times per day.  STRENGTH - Elbow Flexors, Supinated  With good posture, stand or sit on a firm chair without armrests. Allow your right / left arm to rest at your side with your palm facing forward.  Holding a __________ weight, or gripping a rubber exercise band or tubing, bring your hand toward your shoulder.  Allow your muscles to control the resistance as your hand returns to your side. Repeat __________ times. Complete this exercise __________ times per day.  STRENGTH - Elbow Flexors, Neutral  With good posture, stand or sit on a firm chair without armrests. Allow your right / left arm to rest at your side with your thumb facing forward.  Holding a __________ weight, or gripping a rubber exercise band or tubing, bring your hand toward your shoulder.  Allow your muscles to control the resistance as your hand returns to your side. Repeat __________ times. Complete this exercise __________ times per day.   Document Released: 04/10/2005 Document Revised: 07/03/2011 Document Reviewed: 07/23/2008 [facility] Patient Information 2015 [place], [place]. This information is not intended to replace advice given to you by your health care provider. Make sure you discuss any questions you have with your health care provider.

## 2013-10-23 NOTE — Telephone Encounter (Signed)
Called the [facility] to determine if there were any available openings for the right upper extremeity u/s today. If patient can be there before 9:00 am it can be done this morning, otheriwse the next appt is tonight at 5:30. Called the patient at [phone] (her cell) and [phone] (son's cell) to advise. Left message to her to return my call.

## 2013-10-23 NOTE — Progress Notes (Signed)
VASCULAR LAB PRELIMINARY  PRELIMINARY  PRELIMINARY  PRELIMINARY  Right upper extremity venous Doppler completed.    Preliminary report:  There is no DVT or SVT noted in the right upper extremity.   Eugine Bubb, RVT 10/23/2013, 6:11 PM

## 2013-10-27 ENCOUNTER — Telehealth: Payer: Self-pay | Admitting: *Deleted

## 2013-10-27 NOTE — Progress Notes (Signed)
Left message with pts son for her to return my call

## 2013-10-27 NOTE — Telephone Encounter (Signed)
Left message on voice mail for the patient to return my call

## 2013-10-27 NOTE — Telephone Encounter (Signed)
Message copied by [name] on Mon Oct 27, 2013  9:05 AM ------      Message from: Kristin Burnett      Created: Wed Oct 22, 2013  8:33 AM       Advise patient, urine culture showed no infection.       also, was arm ultrasound of the arm done? Results? ------

## 2013-12-04 ENCOUNTER — Other Ambulatory Visit: Payer: Self-pay | Admitting: Internal Medicine

## 2013-12-08 ENCOUNTER — Telehealth: Payer: Self-pay

## 2013-12-08 ENCOUNTER — Ambulatory Visit: Payer: BC Managed Care – PPO | Admitting: Obstetrics & Gynecology

## 2013-12-08 ENCOUNTER — Encounter: Payer: Self-pay | Admitting: Obstetrics & Gynecology

## 2013-12-08 NOTE — Telephone Encounter (Signed)
Patient missed today's appointment. Attempted to call patient. No answer. Left message stating we are sorry you missed your appointment, please call clinic if you would like to reschedule.

## 2014-01-04 ENCOUNTER — Ambulatory Visit (HOSPITAL_COMMUNITY): Payer: BC Managed Care – PPO | Attending: Emergency Medicine

## 2014-01-04 ENCOUNTER — Encounter (HOSPITAL_COMMUNITY): Payer: Self-pay | Admitting: Emergency Medicine

## 2014-01-04 ENCOUNTER — Emergency Department (INDEPENDENT_AMBULATORY_CARE_PROVIDER_SITE_OTHER)
Admission: EM | Admit: 2014-01-04 | Discharge: 2014-01-04 | Disposition: A | Discharge status: Home / Self Care | Payer: BC Managed Care – PPO | Source: Home / Self Care | Attending: Family Medicine | Admitting: Family Medicine

## 2014-01-04 DIAGNOSIS — R059 Cough, unspecified: Secondary | ICD-10-CM | POA: Diagnosis present

## 2014-01-04 DIAGNOSIS — R05 Cough: Secondary | ICD-10-CM | POA: Diagnosis present

## 2014-01-04 DIAGNOSIS — J189 Pneumonia, unspecified organism: Secondary | ICD-10-CM | POA: Insufficient documentation

## 2014-01-04 DIAGNOSIS — R0602 Shortness of breath: Secondary | ICD-10-CM | POA: Diagnosis present

## 2014-01-04 DIAGNOSIS — R0989 Other specified symptoms and signs involving the circulatory and respiratory systems: Secondary | ICD-10-CM | POA: Diagnosis present

## 2014-01-04 LAB — POCT I-STAT, CHEM 8
BUN: 17 mg/dL (ref 6–23)
CHLORIDE: 104 meq/L (ref 96–112)
CREATININE: 1.6 mg/dL — AB (ref 0.50–1.10)
Calcium, Ion: 1.22 mmol/L (ref 1.12–1.23)
Glucose, Bld: 99 mg/dL (ref 70–99)
HEMATOCRIT: 40 % (ref 36.0–46.0)
HEMOGLOBIN: 13.6 g/dL (ref 12.0–15.0)
POTASSIUM: 3.7 meq/L (ref 3.7–5.3)
SODIUM: 137 meq/L (ref 137–147)
TCO2: 24 mmol/L (ref 0–100)

## 2014-01-04 MED ORDER — IPRATROPIUM BROMIDE 0.02 % IN SOLN
RESPIRATORY_TRACT | Status: AC
  Filled 2014-01-04: qty 2.5

## 2014-01-04 MED ORDER — PREDNISONE 20 MG PO TABS
60.0000 mg | ORAL_TABLET | Freq: Once | ORAL | Status: AC
  Administered 2014-01-04: 60 mg via ORAL

## 2014-01-04 MED ORDER — MOXIFLOXACIN HCL 400 MG PO TABS: 400.0000 mg | ORAL_TABLET | Freq: Every day | ORAL | Status: DC

## 2014-01-04 MED ORDER — LIDOCAINE HCL (PF) 1 % IJ SOLN
INTRAMUSCULAR | Status: AC
  Filled 2014-01-04: qty 10

## 2014-01-04 MED ORDER — PREDNISONE 10 MG PO TABS: ORAL_TABLET | ORAL | Status: DC

## 2014-01-04 MED ORDER — ALBUTEROL SULFATE (2.5 MG/3ML) 0.083% IN NEBU
5.0000 mg | INHALATION_SOLUTION | Freq: Once | RESPIRATORY_TRACT | Status: AC
  Administered 2014-01-04: 5 mg via RESPIRATORY_TRACT

## 2014-01-04 MED ORDER — ALBUTEROL SULFATE (2.5 MG/3ML) 0.083% IN NEBU
INHALATION_SOLUTION | RESPIRATORY_TRACT | Status: AC
  Filled 2014-01-04: qty 6

## 2014-01-04 MED ORDER — ALBUTEROL SULFATE HFA 108 (90 BASE) MCG/ACT IN AERS: 2.0000 | INHALATION_SPRAY | RESPIRATORY_TRACT | Status: DC | PRN

## 2014-01-04 MED ORDER — IPRATROPIUM BROMIDE 0.02 % IN SOLN
0.5000 mg | Freq: Once | RESPIRATORY_TRACT | Status: AC
  Administered 2014-01-04: 0.5 mg via RESPIRATORY_TRACT

## 2014-01-04 MED ORDER — CEFTRIAXONE SODIUM 1 G IJ SOLR
2.0000 g | Freq: Once | INTRAMUSCULAR | Status: AC
  Administered 2014-01-04: 2 g via INTRAMUSCULAR

## 2014-01-04 MED ORDER — PREDNISONE 20 MG PO TABS
ORAL_TABLET | ORAL | Status: AC
  Filled 2014-01-04: qty 3

## 2014-01-04 MED ORDER — SODIUM CHLORIDE 0.9 % IN NEBU
INHALATION_SOLUTION | RESPIRATORY_TRACT | Status: AC
  Filled 2014-01-04: qty 3

## 2014-01-04 MED ORDER — CEFTRIAXONE SODIUM 1 G IJ SOLR
INTRAMUSCULAR | Status: AC
  Filled 2014-01-04: qty 20

## 2014-01-04 NOTE — ED Notes (Signed)
Started with runny nose and productive cough 5 days ago.  Now feeling SOB.  Runny nose has resolved.  Denies any pain or fevers.  Has been taking Mucinex, Astelin, and albuterol without significant relief.

## 2014-01-04 NOTE — ED Notes (Signed)
Pt transported to main hospital XR dept.

## 2014-01-04 NOTE — ED Notes (Signed)
No S/S allergic reaction.

## 2014-01-04 NOTE — ED Provider Notes (Signed)
CSN: [id number]     Arrival date & time 01/04/14  1617 History   First MD Initiated Contact with Patient 01/04/14 1634     Chief Complaint  Patient presents with  . Cough   (Consider location/radiation/quality/duration/timing/severity/associated sxs/prior Treatment) HPI  48 year old female with 20-pack-year history presents complaining of cough, runny nose, fatigue,  shortness of breath.This started 5 days ago with just a runny nose and a productive cough. The shortness of breath started 3 days ago. It seems to be getting worse. The shortness of breath is worse on exertion. She denies any fever, chills, chest pain. She has no recent travel or sick contacts. She has no history of pneumonia. She is taking over-the-counter medications and her albuterol inhaler without relief.   Past Medical History  Diagnosis Date  . Hypertension   . DUB (dysfunctional uterine bleeding)   . History of blood transfusion 2011    [facility]  . GERD (gastroesophageal reflux disease)     otc tums  . Recurrent UTI, h/o urethra diverticuli 10/13/2009    Qualifier: Diagnosis of  By: Stanford Scotland MD, Jiles Garter     Past Surgical History  Procedure Laterality Date  . Tubal ligation    . Laser ablation    . Robotic assisted total hysterectomy  03/07/2012    Procedure: ROBOTIC ASSISTED TOTAL HYSTERECTOMY;  Surgeon: Lavonia Drafts, MD;  Location: [facility];  Service: Gynecology;  Laterality: N/A;  . Bilateral salpingectomy  03/07/2012    Procedure: BILATERAL SALPINGECTOMY;  Surgeon: Lavonia Drafts, MD;  Location: [facility];  Service: Gynecology;  Laterality: Bilateral;  . Cystoscopy  03/07/2012    Procedure: CYSTOSCOPY;  Surgeon: Lavonia Drafts, MD;  Location: [facility];  Service: Gynecology;  Laterality: N/A;   Family History  Problem Relation Age of Onset  . Hypertension Mother     [place]  . Arthritis Mother   . Anemia Mother   . Diabetes Father   . Stroke Father 53  . Heart disease Father      CHF, has a defib  . Heart disease Brother     CHF  . Colon cancer Neg Hx   . Breast cancer Neg Hx   . Cancer Other     MGM "blood cancer"  . Cancer Mother     uterine?   History  Substance Use Topics  . Smoking status: Current Every Day Smoker -- 1.00 packs/day for 20 years    Types: Cigarettes  . Smokeless tobacco: Never Used  . Alcohol Use: No   OB History   Grav Para Term Preterm Abortions TAB SAB Ect Mult Living   3 3 3       3      Review of Systems  Constitutional: Positive for fatigue. Negative for fever and chills.  HENT: Positive for congestion and rhinorrhea. Negative for sore throat.   Respiratory: Positive for cough and shortness of breath. Negative for chest tightness.   Cardiovascular: Negative for chest pain and leg swelling.  All other systems reviewed and are negative.   Allergies  Review of patient's allergies indicates no known allergies.  Home Medications   Prior to Admission medications   Medication Sig Start Date End Date Taking? Authorizing Provider  albuterol (VENTOLIN HFA) 108 (90 BASE) MCG/ACT inhaler Inhale 2 puffs into the lungs every 6 (six) hours as needed for wheezing. 01/09/13  Yes Yvonne R Lowne, DO  azelastine (ASTELIN) 137 MCG/SPRAY nasal spray Place 2 sprays into both nostrils at bedtime as needed for rhinitis.  03/05/13  Yes Colon Branch, MD  losartan-hydrochlorothiazide (HYZAAR) 100-25 MG per tablet Take 1 tablet by mouth daily.   Yes Historical Provider, MD  metoprolol (LOPRESSOR) 50 MG tablet TAKE ONE TABLET BY MOUTH TWICE DAILY 12/04/13  Yes Colon Branch, MD  naproxen (NAPROSYN) 500 MG tablet Take 1 tablet (500 mg total) by mouth 2 (two) times daily with a meal. 10/23/13  Yes Margarita Mail, PA-C  albuterol (PROVENTIL HFA;VENTOLIN HFA) 108 (90 BASE) MCG/ACT inhaler Inhale 2 puffs into the lungs every 4 (four) hours as needed for wheezing. 01/04/14   Freeman Caldron Aleni Andrus, PA-C  amLODipine (NORVASC) 5 MG tablet Take 1 tablet (5 mg total) by mouth  daily. 09/19/12   Colon Branch, MD  aspirin 325 MG tablet Take 325 mg by mouth once.    Historical Provider, MD  BIOTIN PO Take 1 tablet by mouth daily.    Historical Provider, MD  moxifloxacin (AVELOX) 400 MG tablet Take 1 tablet (400 mg total) by mouth daily. 01/04/14   Liam Graham, PA-C  predniSONE (DELTASONE) 10 MG tablet 4 tabs PO QD for 4 days; 3 tabs PO QD for 3 days; 2 tabs PO QD for 2 days; 1 tab PO QD for 1 day 01/04/14   Liam Graham, PA-C   BP 143/93  Pulse 94  Temp(Src) 98.9 F (37.2 C) (Oral)  Resp 16  SpO2 97%  LMP 02/29/2012 Physical Exam  Nursing note and vitals reviewed. Constitutional: She is oriented to person, place, and time. Vital signs are normal. She appears well-developed and well-nourished. No distress.  HENT:  Head: Normocephalic and atraumatic.  Eyes: Conjunctivae are normal.  Neck: Normal range of motion. Neck supple.  Cardiovascular: Normal rate, regular rhythm, normal heart sounds and intact distal pulses.  Exam reveals no gallop and no friction rub.   No murmur heard. Pulmonary/Chest: Effort normal. Not tachypneic and not bradypneic. No respiratory distress. She has wheezes ( diffuse). She has rales in the right upper field.  Lymphadenopathy:    She has no cervical adenopathy.  Neurological: She is alert and oriented to person, place, and time. She has normal strength. Coordination normal.  Skin: Skin is warm and dry. No rash noted. She is not diaphoretic.  Psychiatric: She has a normal mood and affect. Judgment normal.    ED Course  Procedures (including critical care time) Labs Review Labs Reviewed  POCT I-STAT, CHEM 8 - Abnormal; Notable for the following:    Creatinine, Ser 1.60 (*)    All other components within normal limits    Imaging Review Dg Chest 2 View  01/04/2014   CLINICAL DATA:  Cough.  Congestion.  Short of breath.  EXAM: CHEST  2 VIEW  COMPARISON:  10/13/2009.  FINDINGS: Multifocal airspace disease is present including in  the RIGHT middle lobe and in the LEFT lung base. This is most compatible with multifocal pneumonia. Cardiopericardial silhouette appears within normal limits. RIGHT infrahilar density is present, also likely representing another focus of airspace disease.  IMPRESSION: Multifocal pneumonia. Followup in 4-6 weeks to ensure radiographic clearing and exclude an underlying lesion is recommended.   Electronically Signed   By: Dereck Ligas M.D.   On: 01/04/2014 18:37     MDM   1. CAP (community acquired pneumonia)    minimal improvement with DuoNeb. x-ray reveals multifocal pneumonia. We'll give an injection of ceftriaxone here and discharge with Avelox, prednisone, albuterol inhaler. She will followup for recheck in 2-3 days. ER if  worsening.   Meds ordered this encounter  Medications  . albuterol (PROVENTIL) (2.5 MG/3ML) 0.083% nebulizer solution 5 mg    Sig:   . ipratropium (ATROVENT) nebulizer solution 0.5 mg    Sig:   . cefTRIAXone (ROCEPHIN) injection 2 g    Sig:   . predniSONE (DELTASONE) tablet 60 mg    Sig:   . moxifloxacin (AVELOX) 400 MG tablet    Sig: Take 1 tablet (400 mg total) by mouth daily.    Dispense:  7 tablet    Refill:  0    Order Specific Question:  Supervising Provider    Answer:  Jake Michaelis, DAVID C [id number]  . predniSONE (DELTASONE) 10 MG tablet    Sig: 4 tabs PO QD for 4 days; 3 tabs PO QD for 3 days; 2 tabs PO QD for 2 days; 1 tab PO QD for 1 day    Dispense:  30 tablet    Refill:  0    Order Specific Question:  Supervising Provider    Answer:  Jake Michaelis, DAVID C [id number]  . albuterol (PROVENTIL HFA;VENTOLIN HFA) 108 (90 BASE) MCG/ACT inhaler    Sig: Inhale 2 puffs into the lungs every 4 (four) hours as needed for wheezing.    Dispense:  1 Inhaler    Refill:  0    Order Specific Question:  Supervising Provider    Answer:  Jake Michaelis, DAVID C [[id number]]       Liam Graham, PA-C 01/04/14 2007

## 2014-01-04 NOTE — Discharge Instructions (Signed)

## 2014-01-04 NOTE — ED Notes (Signed)
S/S allergic reaction reviewed w/ pt. 

## 2014-01-04 NOTE — ED Notes (Signed)
Breathing treatment in progress

## 2014-01-05 NOTE — ED Provider Notes (Signed)
Medical screening examination/treatment/procedure(s) were performed by a resident physician or non-physician practitioner and as the supervising physician I was immediately available for consultation/collaboration.  Linna Darner, MD Family Medicine   Waldemar Dickens, MD 01/05/14 [phone]

## 2014-01-07 ENCOUNTER — Telehealth: Payer: Self-pay | Admitting: Internal Medicine

## 2014-01-07 ENCOUNTER — Encounter (HOSPITAL_COMMUNITY): Payer: Self-pay | Admitting: Emergency Medicine

## 2014-01-07 ENCOUNTER — Emergency Department (INDEPENDENT_AMBULATORY_CARE_PROVIDER_SITE_OTHER)
Admission: EM | Admit: 2014-01-07 | Discharge: 2014-01-07 | Disposition: A | Discharge status: Home / Self Care | Payer: BC Managed Care – PPO | Source: Home / Self Care | Attending: Family Medicine | Admitting: Family Medicine

## 2014-01-07 DIAGNOSIS — R5383 Other fatigue: Secondary | ICD-10-CM

## 2014-01-07 DIAGNOSIS — R5381 Other malaise: Secondary | ICD-10-CM

## 2014-01-07 DIAGNOSIS — J189 Pneumonia, unspecified organism: Secondary | ICD-10-CM

## 2014-01-07 MED ORDER — FLUCONAZOLE 150 MG PO TABS: 150.0000 mg | ORAL_TABLET | Freq: Every day | ORAL | Status: DC

## 2014-01-07 NOTE — Telephone Encounter (Signed)
Patient diagnosed with pneumonia, needs followup next week, please arrange

## 2014-01-07 NOTE — ED Notes (Signed)
Pt  Seen sev  Days  Ago  For  pnuemonia   Here  Today  For  A  followup     Pt  States  She  Is  Doing        better

## 2014-01-07 NOTE — ED Provider Notes (Signed)
CSN: [id number]     Arrival date & time 01/07/14  1148 History   First MD Initiated Contact with Patient 01/07/14 1203     Chief Complaint  Patient presents with  . Follow-up   (Consider location/radiation/quality/duration/timing/severity/associated sxs/prior Treatment) HPI  Pneumonia: much improved after starting Avalox, prednisone, and albuterol. Still some SOB on exertion but improved overall. Fatigue. Afebrile since last appt. No vaginal discharge or irritation. Frequent yeast infections w/ ABX.   Previous encounter note reviewed in full   Past Medical History  Diagnosis Date  . Hypertension   . DUB (dysfunctional uterine bleeding)   . History of blood transfusion 2011    [facility]  . GERD (gastroesophageal reflux disease)     otc tums  . Recurrent UTI, h/o urethra diverticuli 10/13/2009    Qualifier: Diagnosis of  By: Stanford Scotland MD, Jiles Garter     Past Surgical History  Procedure Laterality Date  . Tubal ligation    . Laser ablation    . Robotic assisted total hysterectomy  03/07/2012    Procedure: ROBOTIC ASSISTED TOTAL HYSTERECTOMY;  Surgeon: Lavonia Drafts, MD;  Location: [facility];  Service: Gynecology;  Laterality: N/A;  . Bilateral salpingectomy  03/07/2012    Procedure: BILATERAL SALPINGECTOMY;  Surgeon: Lavonia Drafts, MD;  Location: [facility];  Service: Gynecology;  Laterality: Bilateral;  . Cystoscopy  03/07/2012    Procedure: CYSTOSCOPY;  Surgeon: Lavonia Drafts, MD;  Location: [facility];  Service: Gynecology;  Laterality: N/A;   Family History  Problem Relation Age of Onset  . Hypertension Mother     [place]  . Arthritis Mother   . Anemia Mother   . Diabetes Father   . Stroke Father 14  . Heart disease Father     CHF, has a defib  . Heart disease Brother     CHF  . Colon cancer Neg Hx   . Breast cancer Neg Hx   . Cancer Other     MGM "blood cancer"  . Cancer Mother     uterine?   History  Substance Use Topics  . Smoking status:  Current Every Day Smoker -- 1.00 packs/day for 20 years    Types: Cigarettes  . Smokeless tobacco: Never Used  . Alcohol Use: No   OB History   Grav Para Term Preterm Abortions TAB SAB Ect Mult Living   3 3 3       3      Review of Systems Per HPI with all other pertinent systems negative.   Allergies  Review of patient's allergies indicates no known allergies.  Home Medications   Prior to Admission medications   Medication Sig Start Date End Date Taking? Authorizing Provider  albuterol (PROVENTIL HFA;VENTOLIN HFA) 108 (90 BASE) MCG/ACT inhaler Inhale 2 puffs into the lungs every 4 (four) hours as needed for wheezing. 01/04/14   Liam Graham, PA-C  albuterol (VENTOLIN HFA) 108 (90 BASE) MCG/ACT inhaler Inhale 2 puffs into the lungs every 6 (six) hours as needed for wheezing. 01/09/13   Alferd Apa Lowne, DO  amLODipine (NORVASC) 5 MG tablet Take 1 tablet (5 mg total) by mouth daily. 09/19/12   Colon Branch, MD  aspirin 325 MG tablet Take 325 mg by mouth once.    Historical Provider, MD  azelastine (ASTELIN) 137 MCG/SPRAY nasal spray Place 2 sprays into both nostrils at bedtime as needed for rhinitis. 03/05/13   Colon Branch, MD  BIOTIN PO Take 1 tablet by mouth daily.  Historical Provider, MD  fluconazole (DIFLUCAN) 150 MG tablet Take 1 tablet (150 mg total) by mouth daily. Repeat dose in 3 days 01/07/14   Waldemar Dickens, MD  losartan-hydrochlorothiazide (HYZAAR) 100-25 MG per tablet Take 1 tablet by mouth daily.    Historical Provider, MD  metoprolol (LOPRESSOR) 50 MG tablet TAKE ONE TABLET BY MOUTH TWICE DAILY 12/04/13   Colon Branch, MD  moxifloxacin (AVELOX) 400 MG tablet Take 1 tablet (400 mg total) by mouth daily. 01/04/14   Liam Graham, PA-C  naproxen (NAPROSYN) 500 MG tablet Take 1 tablet (500 mg total) by mouth 2 (two) times daily with a meal. 10/23/13   Margarita Mail, PA-C  predniSONE (DELTASONE) 10 MG tablet 4 tabs PO QD for 4 days; 3 tabs PO QD for 3 days; 2 tabs PO QD for 2 days;  1 tab PO QD for 1 day 01/04/14   Liam Graham, PA-C   BP 133/84  Pulse 95  Temp(Src) 97.1 F (36.2 C) (Oral)  Resp 16  SpO2 100%  LMP 02/29/2012 Physical Exam  Constitutional: She is oriented to person, place, and time. She appears well-developed and well-nourished. No distress.  HENT:  Head: Normocephalic and atraumatic.  Eyes: EOM are normal. Pupils are equal, round, and reactive to light.  Neck: Normal range of motion.  Cardiovascular: Normal heart sounds and intact distal pulses.   Pulmonary/Chest:  Mild wheezing and ronchi in the bases bilat.  nml effort  Abdominal: Soft. She exhibits no distension.  Musculoskeletal: Normal range of motion. She exhibits no tenderness.  Neurological: She is alert and oriented to person, place, and time.  Skin: Skin is warm. No rash noted. She is not diaphoretic.  Psychiatric: Her behavior is normal. Judgment and thought content normal.    ED Course  Procedures (including critical care time) Labs Review Labs Reviewed - No data to display  Imaging Review No results found.   MDM   1. CAP (community acquired pneumonia)   2. Other fatigue    Improving Continue Avalox, prednisone, and albuterol Encouraged ambulation and increased deep breathing F/u PCP in 1 week as previously scheduled Precautions given and all questions answered  Linna Darner, MD Family Medicine 01/07/2014, 12:20 PM      Waldemar Dickens, MD 01/07/14 1220

## 2014-01-07 NOTE — Discharge Instructions (Signed)
You are doing well and your pneumonia is going away Please stay active and try to get out and walk as much as possible Please follow up at your regular doctor this week to have repeat lab work Pneumonia Pneumonia is an infection of the lungs.  CAUSES Pneumonia may be caused by bacteria or a virus. Usually, these infections are caused by breathing infectious particles into the lungs (respiratory tract). SIGNS AND SYMPTOMS   Cough.  Fever.  Chest pain.  Increased rate of breathing.  Wheezing.  Mucus production. DIAGNOSIS  If you have the common symptoms of pneumonia, your health care provider will typically confirm the diagnosis with a chest X-ray. The X-ray will show an abnormality in the lung (pulmonary infiltrate) if you have pneumonia. Other tests of your blood, urine, or sputum may be done to find the specific cause of your pneumonia. Your health care provider may also do tests (blood gases or pulse oximetry) to see how well your lungs are working. TREATMENT  Some forms of pneumonia may be spread to other people when you cough or sneeze. You may be asked to wear a mask before and during your exam. Pneumonia that is caused by bacteria is treated with antibiotic medicine. Pneumonia that is caused by the influenza virus may be treated with an antiviral medicine. Most other viral infections must run their course. These infections will not respond to antibiotics.  HOME CARE INSTRUCTIONS   Cough suppressants may be used if you are losing too much rest. However, coughing protects you by clearing your lungs. You should avoid using cough suppressants if you can.  Your health care provider may have prescribed medicine if he or she thinks your pneumonia is caused by bacteria or influenza. Finish your medicine even if you start to feel better.  Your health care provider may also prescribe an expectorant. This loosens the mucus to be coughed up.  Take medicines only as directed by your health  care provider.  Do not smoke. Smoking is a common cause of bronchitis and can contribute to pneumonia. If you are a smoker and continue to smoke, your cough may last several weeks after your pneumonia has cleared.  A cold steam vaporizer or humidifier in your room or home may help loosen mucus.  Coughing is often worse at night. Sleeping in a semi-upright position in a recliner or using a couple pillows under your head will help with this.  Get rest as you feel it is needed. Your body will usually let you know when you need to rest. PREVENTION A pneumococcal shot (vaccine) is available to prevent a common bacterial cause of pneumonia. This is usually suggested for:  People over 38 years old.  Patients on chemotherapy.  People with chronic lung problems, such as bronchitis or emphysema.  People with immune system problems. If you are over 65 or have a high risk condition, you may receive the pneumococcal vaccine if you have not received it before. In some countries, a routine influenza vaccine is also recommended. This vaccine can help prevent some cases of pneumonia.You may be offered the influenza vaccine as part of your care. If you smoke, it is time to quit. You may receive instructions on how to stop smoking. Your health care provider can provide medicines and counseling to help you quit. SEEK MEDICAL CARE IF: You have a fever. SEEK IMMEDIATE MEDICAL CARE IF:   Your illness becomes worse. This is especially true if you are elderly or weakened from any  other disease.  You cannot control your cough with suppressants and are losing sleep.  You begin coughing up blood.  You develop pain which is getting worse or is uncontrolled with medicines.  Any of the symptoms which initially brought you in for treatment are getting worse rather than better.  You develop shortness of breath or chest pain. MAKE SURE YOU:   Understand these instructions.  Will watch your condition.  Will  get help right away if you are not doing well or get worse. Document Released: 04/10/2005 Document Revised: 08/25/2013 Document Reviewed: 06/30/2010 [facility] Patient Information 2015 [place], [place]. This information is not intended to replace advice given to you by your health care provider. Make sure you discuss any questions you have with your health care provider.

## 2014-01-08 NOTE — Telephone Encounter (Signed)
Pt has ED F/U appt on 9/23.

## 2014-01-14 ENCOUNTER — Ambulatory Visit: Payer: BC Managed Care – PPO | Admitting: Internal Medicine

## 2014-01-26 ENCOUNTER — Ambulatory Visit: Payer: BC Managed Care – PPO | Admitting: Internal Medicine

## 2014-01-26 DIAGNOSIS — Z0289 Encounter for other administrative examinations: Secondary | ICD-10-CM

## 2014-01-28 ENCOUNTER — Ambulatory Visit: Payer: BC Managed Care – PPO | Admitting: Internal Medicine

## 2014-01-28 ENCOUNTER — Telehealth: Payer: Self-pay | Admitting: *Deleted

## 2014-01-28 DIAGNOSIS — Z0289 Encounter for other administrative examinations: Secondary | ICD-10-CM

## 2014-01-28 NOTE — Telephone Encounter (Signed)
Pt did not show for appointment 01/28/2014 at 11:30am for ED Follow Up. She missed appointment 01/14/2014 for ED Follow up stating she went to wrong location. She has 1 other no show this year 07/04/2013.

## 2014-02-23 ENCOUNTER — Encounter (HOSPITAL_COMMUNITY): Payer: Self-pay | Admitting: Emergency Medicine

## 2014-03-02 ENCOUNTER — Other Ambulatory Visit: Payer: Self-pay

## 2014-03-02 MED ORDER — METOPROLOL TARTRATE 50 MG PO TABS: ORAL_TABLET | ORAL | Status: DC

## 2014-03-12 ENCOUNTER — Other Ambulatory Visit: Payer: Self-pay

## 2014-03-12 MED ORDER — METOPROLOL TARTRATE 50 MG PO TABS: ORAL_TABLET | ORAL | Status: DC

## 2014-07-06 DIAGNOSIS — N361 Urethral diverticulum: Secondary | ICD-10-CM | POA: Insufficient documentation

## 2014-07-06 DIAGNOSIS — R350 Frequency of micturition: Secondary | ICD-10-CM | POA: Insufficient documentation

## 2014-09-07 ENCOUNTER — Other Ambulatory Visit: Payer: Self-pay

## 2014-09-07 ENCOUNTER — Encounter: Payer: Self-pay | Admitting: Internal Medicine

## 2014-09-07 ENCOUNTER — Ambulatory Visit (INDEPENDENT_AMBULATORY_CARE_PROVIDER_SITE_OTHER): Payer: BLUE CROSS/BLUE SHIELD | Admitting: Internal Medicine

## 2014-09-07 VITALS — BP 128/84 | HR 62 | Temp 98.1°F | Ht 64.0 in | Wt 252.2 lb

## 2014-09-07 DIAGNOSIS — I10 Essential (primary) hypertension: Secondary | ICD-10-CM | POA: Diagnosis not present

## 2014-09-07 DIAGNOSIS — J9801 Acute bronchospasm: Secondary | ICD-10-CM | POA: Diagnosis not present

## 2014-09-07 DIAGNOSIS — D72829 Elevated white blood cell count, unspecified: Secondary | ICD-10-CM

## 2014-09-07 MED ORDER — LOSARTAN POTASSIUM-HCTZ 100-25 MG PO TABS: 1.0000 | ORAL_TABLET | Freq: Every day | ORAL | Status: DC

## 2014-09-07 MED ORDER — METOPROLOL TARTRATE 50 MG PO TABS: 50.0000 mg | ORAL_TABLET | Freq: Two times a day (BID) | ORAL | Status: DC

## 2014-09-07 MED ORDER — BUDESONIDE-FORMOTEROL FUMARATE 160-4.5 MCG/ACT IN AERO: 2.0000 | INHALATION_SPRAY | Freq: Two times a day (BID) | RESPIRATORY_TRACT | Status: DC

## 2014-09-07 NOTE — Patient Instructions (Signed)
Get your blood work before you leave   For cough: Start Symbicort 2 puffs twice a day for 3 weeks, if you continue with symptoms let me know Albuterol is your rescue inhaler, okay to take it as needed Mucinex DM twice a day until better    Come back to the office in 6 months  for a physical exam (or a checkup if you don't desire a physical exam) Please schedule an appointment at the front desk    Come back fasting

## 2014-09-07 NOTE — Progress Notes (Signed)
Pre visit review using our clinic review tool, if applicable. No additional management support is needed unless otherwise documented below in the visit note. 

## 2014-09-07 NOTE — Progress Notes (Signed)
Subjective:    Patient ID: Dickie La, female    DOB: [birth year], 49 y.o.   MRN: [id number]  DOS:  09/07/2014 Type of visit - description : Acute Interval history: Symptoms started 4-5 days ago with cough with sputum production, color?Marland Kitchen Has been using her inhaler in the last couple of days due to wheezing. Occasionally uses a OTC Mucinex DM. I haven't seen her more than a year, has a history of hypertension, self discontinue amlodipine because " it was too much". Denies edema   Review of Systems Denies fever chills. No sinus pain or congestion, no nasal discharge No difficulty breathing. Mild chest congestion. No nausea or vomiting  Past Medical History  Diagnosis Date  . Hypertension   . DUB (dysfunctional uterine bleeding)   . History of blood transfusion 2011    [facility]  . GERD (gastroesophageal reflux disease)     otc tums  . Recurrent UTI, h/o urethra diverticuli 10/13/2009    Qualifier: Diagnosis of  By: Stanford Scotland MD, Jiles Garter    . Yeast vaginitis     Dr. Estill Dooms  . Urethral diverticulum     Dr. Estill Dooms  . Bronchospasm 01/09/2013    Past Surgical History  Procedure Laterality Date  . Tubal ligation    . Laser ablation    . Robotic assisted total hysterectomy  03/07/2012    Procedure: ROBOTIC ASSISTED TOTAL HYSTERECTOMY;  Surgeon: Lavonia Drafts, MD;  Location: [facility];  Service: Gynecology;  Laterality: N/A;  . Bilateral salpingectomy  03/07/2012    Procedure: BILATERAL SALPINGECTOMY;  Surgeon: Lavonia Drafts, MD;  Location: [facility];  Service: Gynecology;  Laterality: Bilateral;  . Cystoscopy  03/07/2012    Procedure: CYSTOSCOPY;  Surgeon: Lavonia Drafts, MD;  Location: [facility];  Service: Gynecology;  Laterality: N/A;    History   Social History  . Marital Status: Divorced    Spouse Name: N/A  . Number of Children: 3  . Years of Education: N/A   Occupational History  . bus driver    Social History Main Topics  . Smoking status:  Current Every Day Smoker -- 1.00 packs/day for 20 years    Types: Cigarettes  . Smokeless tobacco: Never Used  . Alcohol Use: No  . Drug Use: No  . Sexual Activity: Not on file   Other Topics Concern  . Not on file   Social History Narrative   Household: pt and 2 children   Regular exercise: was; not been in 1 mth   Caffeine use: coffee daily        Medication List       This list is accurate as of: 09/07/14  5:37 PM.  Always use your most recent med list.               albuterol 108 (90 BASE) MCG/ACT inhaler  Commonly known as:  PROVENTIL HFA;VENTOLIN HFA  Inhale 2 puffs into the lungs every 4 (four) hours as needed for wheezing.     azelastine 0.1 % nasal spray  Commonly known as:  ASTELIN  Place 2 sprays into both nostrils at bedtime as needed for rhinitis.     BIOTIN PO  Take 1 tablet by mouth daily.     budesonide-formoterol 160-4.5 MCG/ACT inhaler  Commonly known as:  SYMBICORT  Inhale 2 puffs into the lungs 2 (two) times daily.     losartan-hydrochlorothiazide 100-25 MG per tablet  Commonly known as:  HYZAAR  Take 1 tablet by mouth daily.  metoprolol 50 MG tablet  Commonly known as:  LOPRESSOR  Take 1 tablet (50 mg total) by mouth 2 (two) times daily.     naproxen 500 MG tablet  Commonly known as:  NAPROSYN  Take 1 tablet (500 mg total) by mouth 2 (two) times daily with a meal.           Objective:   Physical Exam BP 128/84 mmHg  Pulse 62  Temp(Src) 98.1 F (36.7 C) (Oral)  Ht 5\' 4"  (1.626 m)  Wt 252 lb 4 oz (114.42 kg)  BMI 43.28 kg/m2  SpO2 97%  LMP 02/29/2012 General:   Well developed, well nourished . NAD.  HEENT:  Normocephalic . Face symmetric, atraumatic TMs clear. Nose not congested, sinuses not TTP Lungs:  You rhonchi with cough, slightly increased expiratory time Normal respiratory effort, no intercostal retractions, no accessory muscle use. Heart: RRR,  no murmur.  No pretibial edema bilaterally  Skin: Not pale. Not  jaundice Neurologic:  alert & oriented X3.  Speech normal, gait appropriate for age and unassisted Psych--  Cognition and judgment appear intact.  Cooperative with normal attention span and concentration.  Behavior appropriate. No anxious or depressed appearing.        Assessment & Plan:    Cough, likely related to bronchospasm, see below  Bronchospasm, History of bronchospasm, takes albuterol as needed, symptoms increased for few days. Plan: add  Symbicort temporarily, see instructions, if not clinically improving she will call. Prednisone? Doxycycline?  Hypertension, Last visit about a year ago, reports good compliance with Hyzaar  and metoprolol but self discontinue amlodipine a while back  because states it  was too much, she felt her blood pressure was low. Denies edema. BP today is very good. Plan: Refill meds, BMP, CBC, TSH

## 2014-09-08 LAB — CBC WITH DIFFERENTIAL/PLATELET
BASOS ABS: 0.1 10*3/uL (ref 0.0–0.1)
Basophils Relative: 0.5 % (ref 0.0–3.0)
EOS ABS: 0.5 10*3/uL (ref 0.0–0.7)
Eosinophils Relative: 4.3 % (ref 0.0–5.0)
HCT: 36.5 % (ref 36.0–46.0)
Hemoglobin: 12.1 g/dL (ref 12.0–15.0)
LYMPHS PCT: 16.8 % (ref 12.0–46.0)
Lymphs Abs: 2.1 10*3/uL (ref 0.7–4.0)
MCHC: 33.1 g/dL (ref 30.0–36.0)
MCV: 85.4 fl (ref 78.0–100.0)
Monocytes Absolute: 0.1 10*3/uL (ref 0.1–1.0)
Monocytes Relative: 1.1 % — ABNORMAL LOW (ref 3.0–12.0)
NEUTROS PCT: 77.3 % — AB (ref 43.0–77.0)
Neutro Abs: 9.7 10*3/uL — ABNORMAL HIGH (ref 1.4–7.7)
Platelets: 288 10*3/uL (ref 150.0–400.0)
RBC: 4.27 Mil/uL (ref 3.87–5.11)
RDW: 14.5 % (ref 11.5–15.5)
WBC: 12.5 10*3/uL — ABNORMAL HIGH (ref 4.0–10.5)

## 2014-09-08 LAB — BASIC METABOLIC PANEL
BUN: 24 mg/dL — AB (ref 6–23)
CO2: 22 mEq/L (ref 19–32)
Calcium: 9.6 mg/dL (ref 8.4–10.5)
Chloride: 104 mEq/L (ref 96–112)
Creatinine, Ser: 1.35 mg/dL — ABNORMAL HIGH (ref 0.40–1.20)
GFR: 54.13 mL/min — AB (ref >60.00)
Glucose, Bld: 84 mg/dL (ref 70–99)
Potassium: 3.8 mEq/L (ref 3.5–5.1)
Sodium: 135 mEq/L (ref 135–145)

## 2014-09-08 LAB — TSH: TSH: 1.34 u[IU]/mL (ref 0.35–4.50)

## 2014-09-10 NOTE — Addendum Note (Signed)
Addended by: Wilfrid Lund on: 09/10/2014 02:19 PM   Modules accepted: Orders

## 2014-09-11 ENCOUNTER — Telehealth: Payer: Self-pay | Admitting: Family

## 2014-09-11 NOTE — Telephone Encounter (Signed)
Called pt lt vm mess regarding appt for 6/28 at 9am. Asked pt to call back and confirm appt

## 2014-09-14 ENCOUNTER — Telehealth: Payer: Self-pay | Admitting: Family

## 2014-09-14 NOTE — Telephone Encounter (Signed)
Lt mess for pt regarding appt on 6/28 at 9am.

## 2014-09-16 ENCOUNTER — Telehealth: Payer: Self-pay | Admitting: Hematology & Oncology

## 2014-09-16 NOTE — Telephone Encounter (Signed)
Called patient to give New patient appt.  There was no answer, so i left detailed message about appt and to asked patient to call back to confirm appt

## 2014-10-09 ENCOUNTER — Ambulatory Visit: Payer: BLUE CROSS/BLUE SHIELD | Admitting: Internal Medicine

## 2014-10-12 ENCOUNTER — Telehealth: Payer: Self-pay | Admitting: Internal Medicine

## 2014-10-12 NOTE — Telephone Encounter (Signed)
No, thanks 

## 2014-10-12 NOTE — Telephone Encounter (Signed)
Relation to pt: self   Call back number: [phone] :  Reason for call:  [place] and would like her hepatitis injection and was unsure when her last booster was given? Please advise

## 2014-10-12 NOTE — Telephone Encounter (Signed)
Per our records she has never had a hepatitis vaccination. Pt missed her appt last week, if she would like to reschedule with another provider she may discuss with them, depending on where she is going she may not even need a Hepatitis vaccine.

## 2014-10-12 NOTE — Telephone Encounter (Signed)
LVM advising pt to cancel nurse visit and schedule an appointment with MD.

## 2014-10-12 NOTE — Telephone Encounter (Signed)
Pt no show 10/09/14 2:30pm, acute appt, pt called and rescheduled for 10/13/14, charge?

## 2014-10-13 ENCOUNTER — Other Ambulatory Visit: Payer: Self-pay

## 2014-10-13 ENCOUNTER — Ambulatory Visit (INDEPENDENT_AMBULATORY_CARE_PROVIDER_SITE_OTHER): Payer: BLUE CROSS/BLUE SHIELD | Admitting: Internal Medicine

## 2014-10-13 ENCOUNTER — Encounter: Payer: Self-pay | Admitting: Internal Medicine

## 2014-10-13 ENCOUNTER — Ambulatory Visit: Payer: BLUE CROSS/BLUE SHIELD

## 2014-10-13 VITALS — BP 130/78 | HR 69 | Temp 98.3°F | Ht 64.0 in | Wt 253.5 lb

## 2014-10-13 DIAGNOSIS — Z7189 Other specified counseling: Secondary | ICD-10-CM

## 2014-10-13 DIAGNOSIS — I10 Essential (primary) hypertension: Secondary | ICD-10-CM

## 2014-10-13 DIAGNOSIS — Z7185 Encounter for immunization safety counseling: Secondary | ICD-10-CM

## 2014-10-13 NOTE — Progress Notes (Signed)
Pre visit review using our clinic review tool, if applicable. No additional management support is needed unless otherwise documented below in the visit note. 

## 2014-10-19 ENCOUNTER — Telehealth: Payer: Self-pay | Admitting: Hematology & Oncology

## 2014-10-19 NOTE — Telephone Encounter (Signed)
I spoke w NEW PATIENT today to remind them of their appointment with Dr. Ennever. Also, advised them to bring all medication bottles and insurance card information. ° °

## 2014-10-20 ENCOUNTER — Ambulatory Visit (HOSPITAL_BASED_OUTPATIENT_CLINIC_OR_DEPARTMENT_OTHER): Payer: BLUE CROSS/BLUE SHIELD | Admitting: Family

## 2014-10-20 ENCOUNTER — Other Ambulatory Visit: Payer: Self-pay | Admitting: Family

## 2014-10-20 ENCOUNTER — Encounter: Payer: Self-pay | Admitting: Family

## 2014-10-20 ENCOUNTER — Ambulatory Visit: Payer: BLUE CROSS/BLUE SHIELD

## 2014-10-20 ENCOUNTER — Other Ambulatory Visit (HOSPITAL_BASED_OUTPATIENT_CLINIC_OR_DEPARTMENT_OTHER): Payer: BLUE CROSS/BLUE SHIELD

## 2014-10-20 VITALS — BP 127/82 | HR 63 | Temp 97.9°F | Resp 14 | Ht 64.0 in | Wt 256.0 lb

## 2014-10-20 DIAGNOSIS — D72829 Elevated white blood cell count, unspecified: Secondary | ICD-10-CM

## 2014-10-20 LAB — CBC WITH DIFFERENTIAL (CANCER CENTER ONLY)
BASO#: 0.1 10*3/uL (ref 0.0–0.2)
BASO%: 0.6 % (ref 0.0–2.0)
EOS%: 4.2 % (ref 0.0–7.0)
Eosinophils Absolute: 0.5 10*3/uL (ref 0.0–0.5)
HCT: 34.2 % — ABNORMAL LOW (ref 34.8–46.6)
HGB: 11.6 g/dL (ref 11.6–15.9)
LYMPH#: 2.9 10*3/uL (ref 0.9–3.3)
LYMPH%: 22.8 % (ref 14.0–48.0)
MCH: 30.4 pg (ref 26.0–34.0)
MCHC: 33.9 g/dL (ref 32.0–36.0)
MCV: 90 fL (ref 81–101)
MONO#: 0.9 10*3/uL (ref 0.1–0.9)
MONO%: 6.8 % (ref 0.0–13.0)
NEUT#: 8.3 10*3/uL — ABNORMAL HIGH (ref 1.5–6.5)
NEUT%: 65.6 % (ref 39.6–80.0)
PLATELETS: 281 10*3/uL (ref 145–400)
RBC: 3.82 10*6/uL (ref 3.70–5.32)
RDW: 14.1 % (ref 11.1–15.7)
WBC: 12.6 10*3/uL — AB (ref 3.9–10.0)

## 2014-10-20 LAB — CHCC SATELLITE - SMEAR

## 2014-10-20 NOTE — Progress Notes (Signed)
Hematology/Oncology Consultation   Name: Kristin Burnett      MRN: [id number]    Location: Room/bed info not found  Date: 10/20/2014 Time:10:05 AM   REFERRING PHYSICIAN: Colon Branch, MD  REASON FOR CONSULT: Leukocytosis   DIAGNOSIS: Leukocytosis  HISTORY OF PRESENT ILLNESS: Kristin Burnett is a very pleasant 49 yo female with a history of elevated WBC counts. Today her white count is 12.6. She is asymptomatic and has no complaints at this time.  She has had no problem with infections. No fever, chills, n/v, cough, rash, dizziness, SOB, chest pain, palpitations, abdominal pain, constipation, diarrhea, blood in urine or stool.  She is a 1 ppd smoker. No alcohol.  No lymphadenopathy found on assessment.  There is a history of anemia with her mother and maternal grandmother. No sickle cell disease/trait.  Her father recently passed away from lung cancer. He was a smoker. Her mother had uterine cancer but was treated and is doing well.  She is very active and does a lot of walking. She denies swelling, tenderness, numbness or tingling in her extremities. No new aches or pains.  She works full time as a Recruitment consultant for [employer].  She has 3 children all of whom are healthy. No miscarriages.  She had a total hysterectomy in 2013. She does experience some hot flashes and night sweats but states that these are tolerable.  She is due for a mammogram and states that she will schedule this herself.  She is eating well and staying hydrated. No significant weight loss or gain.    ROS: All other 10 point review of systems is negative.   PAST MEDICAL HISTORY:   Past Medical History  Diagnosis Date  . Hypertension   . DUB (dysfunctional uterine bleeding)   . History of blood transfusion 2011    [facility]  . GERD (gastroesophageal reflux disease)     [facility] tums  . Recurrent UTI, h/o urethra diverticuli 10/13/2009    Qualifier: Diagnosis of  By: Stanford Scotland MD, Jiles Garter    . Yeast vaginitis     Dr. Estill Dooms  .  Urethral diverticulum     Dr. Estill Dooms  . Bronchospasm 01/09/2013    ALLERGIES: No Known Allergies    MEDICATIONS:  Current Outpatient Prescriptions on File Prior to Visit  Medication Sig Dispense Refill  . albuterol (PROVENTIL HFA;VENTOLIN HFA) 108 (90 BASE) MCG/ACT inhaler Inhale 2 puffs into the lungs every 4 (four) hours as needed for wheezing. 1 Inhaler 0  . azelastine (ASTELIN) 137 MCG/SPRAY nasal spray Place 2 sprays into both nostrils at bedtime as needed for rhinitis. 30 mL 3  . BIOTIN PO Take 1 tablet by mouth daily.    . budesonide-formoterol (SYMBICORT) 160-4.5 MCG/ACT inhaler Inhale 2 puffs into the lungs 2 (two) times daily. (Patient taking differently: Inhale 2 puffs into the lungs as needed. ) 1 Inhaler 1  . losartan-hydrochlorothiazide (HYZAAR) 100-25 MG per tablet Take 1 tablet by mouth daily. 90 tablet 1  . metoprolol (LOPRESSOR) 50 MG tablet Take 1 tablet (50 mg total) by mouth 2 (two) times daily. 180 tablet 1  . naproxen (NAPROSYN) 500 MG tablet Take 1 tablet (500 mg total) by mouth 2 (two) times daily with a meal. 30 tablet 0   No current facility-administered medications on file prior to visit.     PAST SURGICAL HISTORY Past Surgical History  Procedure Laterality Date  . Tubal ligation    . Laser ablation    . Robotic assisted  total hysterectomy  03/07/2012    Procedure: ROBOTIC ASSISTED TOTAL HYSTERECTOMY;  Surgeon: Lavonia Drafts, MD;  Location: [facility];  Service: Gynecology;  Laterality: N/A;  . Bilateral salpingectomy  03/07/2012    Procedure: BILATERAL SALPINGECTOMY;  Surgeon: Lavonia Drafts, MD;  Location: [facility];  Service: Gynecology;  Laterality: Bilateral;  . Cystoscopy  03/07/2012    Procedure: CYSTOSCOPY;  Surgeon: Lavonia Drafts, MD;  Location: [facility];  Service: Gynecology;  Laterality: N/A;    FAMILY HISTORY: Family History  Problem Relation Age of Onset  . Hypertension Mother     [place]  . Arthritis Mother   . Anemia  Mother   . Diabetes Father   . Stroke Father 79  . Heart disease Father     CHF, has a defib  . Heart disease Brother     CHF  . Colon cancer Neg Hx   . Breast cancer Neg Hx   . Cancer Other     MGM "blood cancer"  . Cancer Mother     uterine?    SOCIAL HISTORY:  reports that she has been smoking Cigarettes.  She started smoking about 28 years ago. She has a 26 pack-year smoking history. She has never used smokeless tobacco. She reports that she does not drink alcohol or use illicit drugs.  PERFORMANCE STATUS: The patient's performance status is 0 - Asymptomatic  PHYSICAL EXAM: Most Recent Vital Signs: Blood pressure 127/82, pulse 63, temperature 97.9 F (36.6 C), temperature source Oral, resp. rate 14, height 5\' 4"  (1.626 m), weight 256 lb (116.121 kg), last menstrual period 02/29/2012. BP 127/82 mmHg  Pulse 63  Temp(Src) 97.9 F (36.6 C) (Oral)  Resp 14  Ht 5\' 4"  (1.626 m)  Wt 256 lb (116.121 kg)  BMI 43.92 kg/m2  LMP 02/29/2012  General Appearance:    Alert, cooperative, no distress, appears stated age  Head:    Normocephalic, without obvious abnormality, atraumatic  Eyes:    PERRL, conjunctiva/corneas clear, EOM's intact, fundi    benign, both eyes        Throat:   Lips, mucosa, and tongue normal; teeth and gums normal  Neck:   Supple, symmetrical, trachea midline, no adenopathy;    thyroid:  no enlargement/tenderness/nodules; no carotid   bruit or JVD  Back:     Symmetric, no curvature, ROM normal, no CVA tenderness  Lungs:     Clear to auscultation bilaterally, respirations unlabored  Chest Wall:    No tenderness or deformity   Heart:    Regular rate and rhythm, S1 and S2 normal, no murmur, rub   or gallop     Abdomen:     Soft, non-tender, bowel sounds active all four quadrants,    no masses, no organomegaly        Extremities:   Extremities normal, atraumatic, no cyanosis or edema  Pulses:   2+ and symmetric all extremities  Skin:   Skin color, texture,  turgor normal, no rashes or lesions  Lymph nodes:   Cervical, supraclavicular, and axillary nodes normal  Neurologic:   CNII-XII intact, normal strength, sensation and reflexes    throughout   LABORATORY DATA:  Results for orders placed or performed in visit on 10/20/14 (from the past 48 hour(s))  CBC with Differential [facility])     Status: Abnormal   Collection Time: 10/20/14  9:06 AM  Result Value Ref Range   WBC 12.6 (H) 3.9 - 10.0 10e3/uL   RBC 3.82 3.70 - 5.32  10e6/uL   HGB 11.6 11.6 - 15.9 g/dL   HCT 34.2 (L) 34.8 - 46.6 %   MCV 90 81 - 101 fL   MCH 30.4 26.0 - 34.0 pg   MCHC 33.9 32.0 - 36.0 g/dL   RDW 14.1 11.1 - 15.7 %   Platelets 281 145 - 400 10e3/uL   NEUT# 8.3 (H) 1.5 - 6.5 10e3/uL   LYMPH# 2.9 0.9 - 3.3 10e3/uL   MONO# 0.9 0.1 - 0.9 10e3/uL   Eosinophils Absolute 0.5 0.0 - 0.5 10e3/uL   BASO# 0.1 0.0 - 0.2 10e3/uL   NEUT% 65.6 39.6 - 80.0 %   LYMPH% 22.8 14.0 - 48.0 %   MONO% 6.8 0.0 - 13.0 %   EOS% 4.2 0.0 - 7.0 %   BASO% 0.6 0.0 - 2.0 %  [facility] - Smear     Status: None   Collection Time: 10/20/14  9:06 AM  Result Value Ref Range   Smear Result Smear Available       RADIOGRAPHY: No results found.     PATHOLOGY: None  ASSESSMENT/PLAN: Ms. Frick is a very pleasant 49 yo female with a history of elevated WBC counts. Today her white count is 12.6. She is asymptomatic and has no complaints at this time.  She is a 1ppd smoker. The elevated WBC count is likely reactive.  Dr. Marin Olp did view her blood smear and found no abnormality or evidence of malignancy.   We do not need her to follow-up with [place] at this time.   All questions were answered. She knows to call with any questions or concerns. We will gladly see her for any hem/onc issues in the future.   She was discussed with and also seen by Dr. Marin Olp and he is in agreement with the aforementioned.   [facility]     Addendum:  I saw and examined the patient with Marien Manship. I looked  at her blood smear. Her white blood cells appeared mature. I do not see any immature myeloid cells. There are no atypical lymphocytes. Red cells appeared mature. There were no nucleated red blood cells. Platelets were adequate in number and size.  I just don't see any issue with her minimal leukocytosis. I think this is reactive. She is smoking. We often can see a mild leukocytosis and women who smoke.  I don't see any indication for a myeloproliferative disorder. She does not need a bone marrow test. She does not need any x-ray studies. I don't see anything that would suggest a reactive leukocytosis from cancer.  We reassured her. I showed her the blood work.  I don't think we have to get her back to the office.  We spent about 35 minutes with her. We answered all of her questions.  Laurey Arrow

## 2014-10-20 NOTE — Patient Instructions (Signed)
Smoking Cessation Quitting smoking is important to your health and has many advantages. However, it is not always easy to quit since nicotine is a very addictive drug. Oftentimes, people try 3 times or more before being able to quit. This document explains the best ways for you to prepare to quit smoking. Quitting takes hard work and a lot of effort, but you can do it. ADVANTAGES OF QUITTING SMOKING  You will live longer, feel better, and live better.  Your body will feel the impact of quitting smoking almost immediately.  Within 20 minutes, blood pressure decreases. Your pulse returns to its normal level.  After 8 hours, carbon monoxide levels in the blood return to normal. Your oxygen level increases.  After 24 hours, the chance of having a heart attack starts to decrease. Your breath, hair, and body stop smelling like smoke.  After 48 hours, damaged nerve endings begin to recover. Your sense of taste and smell improve.  After 72 hours, the body is virtually free of nicotine. Your bronchial tubes relax and breathing becomes easier.  After 2 to 12 weeks, lungs can hold more air. Exercise becomes easier and circulation improves.  The risk of having a heart attack, stroke, cancer, or lung disease is greatly reduced.  After 1 year, the risk of coronary heart disease is cut in half.  After 5 years, the risk of stroke falls to the same as a nonsmoker.  After 10 years, the risk of lung cancer is cut in half and the risk of other cancers decreases significantly.  After 15 years, the risk of coronary heart disease drops, usually to the level of a nonsmoker.  If you are pregnant, quitting smoking will improve your chances of having a healthy baby.  The people you live with, especially any children, will be healthier.  You will have extra money to spend on things other than cigarettes. QUESTIONS TO THINK ABOUT BEFORE ATTEMPTING TO QUIT You may want to talk about your answers with your  health care provider.  Why do you want to quit?  If you tried to quit in the past, what helped and what did not?  What will be the most difficult situations for you after you quit? How will you plan to handle them?  Who can help you through the tough times? Your family? Friends? A health care provider?  What pleasures do you get from smoking? What ways can you still get pleasure if you quit? Here are some questions to ask your health care provider:  How can you help me to be successful at quitting?  What medicine do you think would be best for me and how should I take it?  What should I do if I need more help?  What is smoking withdrawal like? How can I get information on withdrawal? GET READY  Set a quit date.  Change your environment by getting rid of all cigarettes, ashtrays, matches, and lighters in your home, car, or work. Do not let people smoke in your home.  Review your past attempts to quit. Think about what worked and what did not. GET SUPPORT AND ENCOURAGEMENT You have a better chance of being successful if you have help. You can get support in many ways.  Tell your family, friends, and coworkers that you are going to quit and need their support. Ask them not to smoke around you.  Get individual, group, or telephone counseling and support. Programs are available at local hospitals and health centers. Call   your local health department for information about programs in your area.  Spiritual beliefs and practices may help some smokers quit.  Download a "quit meter" on your computer to keep track of quit statistics, such as how long you have gone without smoking, cigarettes not smoked, and money saved.  Get a self-help book about quitting smoking and staying off tobacco. LEARN NEW SKILLS AND BEHAVIORS  Distract yourself from urges to smoke. Talk to someone, go for a walk, or occupy your time with a task.  Change your normal routine. Take a different route to work.  Drink tea instead of coffee. Eat breakfast in a different place.  Reduce your stress. Take a hot bath, exercise, or read a book.  Plan something enjoyable to do every day. Reward yourself for not smoking.  Explore interactive web-based programs that specialize in helping you quit. GET MEDICINE AND USE IT CORRECTLY Medicines can help you stop smoking and decrease the urge to smoke. Combining medicine with the above behavioral methods and support can greatly increase your chances of successfully quitting smoking.  Nicotine replacement therapy helps deliver nicotine to your body without the negative effects and risks of smoking. Nicotine replacement therapy includes nicotine gum, lozenges, inhalers, nasal sprays, and skin patches. Some may be available over-the-counter and others require a prescription.  Antidepressant medicine helps people abstain from smoking, but how this works is unknown. This medicine is available by prescription.  Nicotinic receptor partial agonist medicine simulates the effect of nicotine in your brain. This medicine is available by prescription. Ask your health care provider for advice about which medicines to use and how to use them based on your health history. Your health care provider will tell you what side effects to look out for if you choose to be on a medicine or therapy. Carefully read the information on the package. Do not use any other product containing nicotine while using a nicotine replacement product.  RELAPSE OR DIFFICULT SITUATIONS Most relapses occur within the first 3 months after quitting. Do not be discouraged if you start smoking again. Remember, most people try several times before finally quitting. You may have symptoms of withdrawal because your body is used to nicotine. You may crave cigarettes, be irritable, feel very hungry, cough often, get headaches, or have difficulty concentrating. The withdrawal symptoms are only temporary. They are strongest  when you first quit, but they will go away within 10-14 days. To reduce the chances of relapse, try to:  Avoid drinking alcohol. Drinking lowers your chances of successfully quitting.  Reduce the amount of caffeine you consume. Once you quit smoking, the amount of caffeine in your body increases and can give you symptoms, such as a rapid heartbeat, sweating, and anxiety.  Avoid smokers because they can make you want to smoke.  Do not let weight gain distract you. Many smokers will gain weight when they quit, usually less than 10 pounds. Eat a healthy diet and stay active. You can always lose the weight gained after you quit.  Find ways to improve your mood other than smoking. FOR MORE INFORMATION  www.smokefree.gov  Document Released: 04/04/2001 Document Revised: 08/25/2013 Document Reviewed: 07/20/2011 [employer] Patient Information 2015 [employer]. This information is not intended to replace advice given to you by your health care provider. Make sure you discuss any questions you have with your health care provider.  

## 2014-10-22 NOTE — Progress Notes (Signed)
Going to [place], patient is referred to the travel clinic

## 2015-03-05 ENCOUNTER — Encounter: Payer: BLUE CROSS/BLUE SHIELD | Admitting: Internal Medicine

## 2016-02-24 ENCOUNTER — Encounter (HOSPITAL_COMMUNITY): Payer: Self-pay | Admitting: Emergency Medicine

## 2016-02-24 ENCOUNTER — Ambulatory Visit (HOSPITAL_COMMUNITY)
Admission: EM | Admit: 2016-02-24 | Discharge: 2016-02-24 | Disposition: A | Discharge status: Home / Self Care | Payer: BLUE CROSS/BLUE SHIELD | Attending: Emergency Medicine | Admitting: Emergency Medicine

## 2016-02-24 DIAGNOSIS — I10 Essential (primary) hypertension: Secondary | ICD-10-CM | POA: Diagnosis not present

## 2016-02-24 LAB — POCT I-STAT, CHEM 8
BUN: 15 mg/dL (ref 6–20)
CALCIUM ION: 1.25 mmol/L (ref 1.15–1.40)
CREATININE: 1.5 mg/dL — AB (ref 0.44–1.00)
Chloride: 106 mmol/L (ref 101–111)
GLUCOSE: 120 mg/dL — AB (ref 65–99)
HEMATOCRIT: 40 % (ref 36.0–46.0)
HEMOGLOBIN: 13.6 g/dL (ref 12.0–15.0)
Potassium: 3.8 mmol/L (ref 3.5–5.1)
Sodium: 140 mmol/L (ref 135–145)
TCO2: 25 mmol/L (ref 0–100)

## 2016-02-24 MED ORDER — LISINOPRIL-HYDROCHLOROTHIAZIDE 20-25 MG PO TABS: 1.0000 | ORAL_TABLET | Freq: Every day | ORAL | 1 refills | Status: DC

## 2016-02-24 MED ORDER — METOPROLOL TARTRATE 50 MG PO TABS: 50.0000 mg | ORAL_TABLET | Freq: Two times a day (BID) | ORAL | 1 refills | Status: DC

## 2016-02-24 NOTE — ED Triage Notes (Signed)
Ran out of blood pressure medicine over 6 months ago.  Headache since last night.  Has been feeling lightheaded as well.  Dizziness is not associated with position.    Last night had chills and nausea.    Reports dizzy episodes are quick, brief episodes.

## 2016-02-24 NOTE — ED Notes (Signed)
Pulse is regular

## 2016-02-24 NOTE — Discharge Instructions (Signed)
Your testing today is normal. Your symptoms may be due to your quite elevated blood pressure. I have sent prescriptions for your blood pressure medicines to the pharmacy. I changed your losartan to lisinopril, as this is cheaper. Follow-up as needed.

## 2016-02-24 NOTE — ED Provider Notes (Signed)
[facility]    CSN: [id number] Arrival date & time: 02/24/16  1213     History   Chief Complaint Chief Complaint  Patient presents with  . Headache    HPI Kristin Burnett is a 50 y.o. female.   HPI  She is a 50 year old woman here for evaluation of headache issue. She reports feeling "swimmy headed." Last night, she reports an episode where she was sitting at the table and suddenly felt like she was falling and was dizzy. She thinks she blacked out for a second or 2. She did not actually fall, she was able to catch herself. This episode was associated with some nausea and chills. Symptoms lasted 1-2 hours and resolved when she went to sleep. This morning, she continues to feel swimmy headed, described as lightheaded. No nausea or chills at this time. She denies any true headache. She has been out of her blood pressure medications for several months. Denies any recent history of chest pains, shortness of breath, exertional symptoms.  Past Medical History:  Diagnosis Date  . Bronchospasm 01/09/2013  . DUB (dysfunctional uterine bleeding)   . GERD (gastroesophageal reflux disease)    [facility] tums  . History of blood transfusion 2011   [facility]  . Hypertension   . Recurrent UTI, h/o urethra diverticuli 10/13/2009   Qualifier: Diagnosis of  By: Stanford Scotland MD, Jiles Garter    . Urethral diverticulum    Dr. Estill Dooms  . Yeast vaginitis    Dr. Estill Dooms    Patient Active Problem List   Diagnosis Date Noted  . 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 FOM (frequency of micturition) 07/06/2014  . Phlebitis of arm 10/20/2013  . Anxiety and depression 03/05/2013  . Bronchospasm 01/09/2013  . Annual physical exam 09/19/2012  . Edema of   legs, L>R 09/19/2012  . GERD (gastroesophageal reflux disease) 09/19/2012  . LEIOMYOMA OF UTERUS, UNSPECIFIED 10/13/2009  . ANEMIA, HYPOCHROMIC 10/13/2009  . ESSENTIAL HYPERTENSION, BENIGN 10/13/2009  . Recurrent UTI, h/o urethra diverticuli 10/13/2009    . DYSFUNCTIONAL UTERINE BLEEDING 10/13/2009    Past Surgical History:  Procedure Laterality Date  . BILATERAL SALPINGECTOMY  03/07/2012   Procedure: BILATERAL SALPINGECTOMY;  Surgeon: Lavonia Drafts, MD;  Location: [facility];  Service: Gynecology;  Laterality: Bilateral;  . CYSTOSCOPY  03/07/2012   Procedure: CYSTOSCOPY;  Surgeon: Lavonia Drafts, MD;  Location: [place] [facility];  Service: Gynecology;  Laterality: N/A;  . LASER ABLATION    . ROBOTIC ASSISTED TOTAL HYSTERECTOMY  03/07/2012   Procedure: ROBOTIC ASSISTED TOTAL HYSTERECTOMY;  Surgeon: Lavonia Drafts, MD;  Location: [place] [facility];  Service: Gynecology;  Laterality: N/A;  . TUBAL LIGATION      OB History    Gravida Para Term Preterm AB Living   3 3 3     3    SAB TAB Ectopic Multiple Live Births                   Home Medications    Prior to Admission medications   Medication Sig Start Date End Date Taking? Authorizing Provider  albuterol (PROVENTIL HFA;VENTOLIN HFA) 108 (90 BASE) MCG/ACT inhaler Inhale 2 puffs into the lungs every 4 (four) hours as needed for wheezing. 01/04/14   Freeman Caldron Baker, PA-C  azelastine (ASTELIN) 137 MCG/SPRAY nasal spray Place 2 sprays into both nostrils at bedtime as needed for rhinitis. 03/05/13   Colon Branch, MD  BIOTIN PO Take 1 tablet by mouth daily.    Historical Provider, MD  budesonide-formoterol (  SYMBICORT) 160-4.5 MCG/ACT inhaler Inhale 2 puffs into the lungs 2 (two) times daily. Patient taking differently: Inhale 2 puffs into the lungs as needed.  09/07/14   Colon Branch, MD  lisinopril-hydrochlorothiazide (PRINZIDE,ZESTORETIC) 20-25 MG tablet Take 1 tablet by mouth daily. 02/24/16   Melony Overly, MD  metoprolol (LOPRESSOR) 50 MG tablet Take 1 tablet (50 mg total) by mouth 2 (two) times daily. 02/24/16   Melony Overly, MD  naproxen (NAPROSYN) 500 MG tablet Take 1 tablet (500 mg total) by mouth 2 (two) times daily with a meal. 10/23/13   Margarita Mail, PA-C    Family History Family  History  Problem Relation Age of Onset  . Hypertension Mother     [place]  . Arthritis Mother   . Anemia Mother   . Cancer Mother     uterine?  . Diabetes Father   . Stroke Father 35  . Heart disease Father     CHF, has a defib  . Heart disease Brother     CHF  . Cancer Other     MGM "blood cancer"  . Colon cancer Neg Hx   . Breast cancer Neg Hx     Social History Social History  Substance Use Topics  . Smoking status: Current Every Day Smoker    Packs/day: 1.00    Years: 26.00    Types: Cigarettes    Start date: 06/21/1986  . Smokeless tobacco: Never Used     Comment: 10-20-14 still smoking  . Alcohol use No     Allergies   Review of patient's allergies indicates no known allergies.   Review of Systems Review of Systems As in history of present illness  Physical Exam Triage Vital Signs ED Triage Vitals [02/24/16 1337]  Enc Vitals Group     BP (!) 189/115     Pulse Rate 76     Resp 20     Temp 98.3 F (36.8 C)     Temp Source Oral     SpO2 100 %     Weight      Height      Head Circumference      Peak Flow      Pain Score      Pain Loc      Pain Edu?      Excl. in [place]?    No data found.   Updated Vital Signs BP (!) 171/128   Pulse 73   Temp 98.3 F (36.8 C) (Oral)   Resp 20   LMP 02/29/2012   SpO2 100%   Visual Acuity Right Eye Distance:   Left Eye Distance:   Bilateral Distance:    Right Eye Near:   Left Eye Near:    Bilateral Near:     Physical Exam  Constitutional: She is oriented to person, place, and time. She appears well-developed and well-nourished. No distress.  Cardiovascular: Normal rate, regular rhythm and normal heart sounds.   No murmur heard. Pulmonary/Chest: Effort normal.  Neurological: She is alert and oriented to person, place, and time.     UC Treatments / Results  Labs (all labs ordered are listed, but only abnormal results are displayed) Labs Reviewed  POCT I-STAT, CHEM 8 - Abnormal; Notable for the  following:       Result Value   Creatinine, Ser 1.50 (*)    Glucose, Bld 120 (*)    All other components within normal limits    EKG  EKG Interpretation  None       Radiology No results found.  Procedures ED EKG Date/Time: 02/24/2016 2:42 PM Performed by: Melony Overly Authorized by: Melony Overly   ECG reviewed by ED Physician in the absence of a cardiologist: yes   Previous ECG:    Previous ECG:  Compared to current   Similarity:  No change Interpretation:    Interpretation: normal   Rate:    ECG rate:  70   ECG rate assessment: normal   Rhythm:    Rhythm: sinus rhythm   Ectopy:    Ectopy: none   QRS:    QRS axis:  Normal Conduction:    Conduction: normal   ST segments:    ST segments:  Normal T waves:    T waves: normal   Comments:     NSR, normal ekg   (including critical care time)  Medications Ordered in UC Medications - No data to display   Initial Impression / Assessment and Plan / UC Course  I have reviewed the triage vital signs and the nursing notes.  Pertinent labs & imaging results that were available during my care of the patient were reviewed by me and considered in my medical decision making (see chart for details).  Clinical Course    Blood work shows an elevated creatinine, but this is stable from previous. EKG is normal. Symptoms likely due to elevated blood pressure. I have restarted her blood pressure medications, but did change losartan to lisinopril due to cost issues. Follow-up as needed.  Final Clinical Impressions(s) / UC Diagnoses   Final diagnoses:  Essential hypertension    New Prescriptions New Prescriptions   LISINOPRIL-HYDROCHLOROTHIAZIDE (PRINZIDE,ZESTORETIC) 20-25 MG TABLET    Take 1 tablet by mouth daily.     Melony Overly, MD 02/24/16 [phone]

## 2017-03-08 ENCOUNTER — Ambulatory Visit (HOSPITAL_COMMUNITY)
Admission: EM | Admit: 2017-03-08 | Discharge: 2017-03-08 | Disposition: A | Discharge status: Home / Self Care | Payer: Self-pay | Attending: Emergency Medicine | Admitting: Emergency Medicine

## 2017-03-08 ENCOUNTER — Ambulatory Visit (INDEPENDENT_AMBULATORY_CARE_PROVIDER_SITE_OTHER): Payer: Self-pay

## 2017-03-08 ENCOUNTER — Encounter (HOSPITAL_COMMUNITY): Payer: Self-pay | Admitting: Family Medicine

## 2017-03-08 DIAGNOSIS — J189 Pneumonia, unspecified organism: Secondary | ICD-10-CM

## 2017-03-08 DIAGNOSIS — F17210 Nicotine dependence, cigarettes, uncomplicated: Secondary | ICD-10-CM

## 2017-03-08 MED ORDER — LEVOFLOXACIN 500 MG PO TABS: 500.0000 mg | ORAL_TABLET | Freq: Every day | ORAL | 0 refills | Status: DC

## 2017-03-08 MED ORDER — ALBUTEROL SULFATE HFA 108 (90 BASE) MCG/ACT IN AERS: 2.0000 | INHALATION_SPRAY | RESPIRATORY_TRACT | 0 refills | Status: DC | PRN

## 2017-03-08 NOTE — Discharge Instructions (Addendum)
Rest,push fluids, take meds as directed. Return to Uc as needed. Go to Er for Cp, palpitations, fever, SOB, or worsening symptoms. Stop smoking.

## 2017-03-08 NOTE — ED Triage Notes (Signed)
Pt here for cough for over a week. Reports productive with mucin ex. No fever. sts now she has a headache.

## 2017-03-08 NOTE — ED Provider Notes (Signed)
[facility]    CSN: [id number] Arrival date & time: 03/08/17  1412     History   Chief Complaint Chief Complaint  Patient presents with  . Cough  . Headache    HPI Kristin Burnett is a 51 y.o. female.   51yr old AA female presents to UC with cc of cough x 1 week, last time I felt like this I had pneumonia(4 yrs prior). Pt smokes 1/2 ppd of cigarettes.    The history is provided by the patient. No language interpreter was used.    Past Medical History:  Diagnosis Date  . Bronchospasm 01/09/2013  . DUB (dysfunctional uterine bleeding)   . GERD (gastroesophageal reflux disease)    otc tums  . [facility] of blood transfusion 2011   [facility]  . Hypertension   . Recurrent UTI, h/o urethra diverticuli 10/13/2009   Qualifier: Diagnosis of  By: Stanford Scotland MD, Jiles Garter    . Urethral diverticulum    Dr. Estill Dooms  . Yeast vaginitis    Dr. Estill Dooms    Patient Active Problem List   Diagnosis Date Noted  . Pneumonia of both lungs due to infectious organism 03/08/2017  . 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 FOM (frequency of micturition) 07/06/2014  . Phlebitis of arm 10/20/2013  . Anxiety and depression 03/05/2013  . Bronchospasm 01/09/2013  . Annual physical exam 09/19/2012  . Edema of   legs, L>R 09/19/2012  . GERD (gastroesophageal reflux disease) 09/19/2012  . LEIOMYOMA OF UTERUS, UNSPECIFIED 10/13/2009  . ANEMIA, HYPOCHROMIC 10/13/2009  . ESSENTIAL HYPERTENSION, BENIGN 10/13/2009  . Recurrent UTI, h/o urethra diverticuli 10/13/2009  . DYSFUNCTIONAL UTERINE BLEEDING 10/13/2009    Past Surgical History:  Procedure Laterality Date  . BILATERAL SALPINGECTOMY  03/07/2012   Procedure: BILATERAL SALPINGECTOMY;  Surgeon: Lavonia Drafts, MD;  Location: [facility] [facility];  Service: Gynecology;  Laterality: Bilateral;  . CYSTOSCOPY  03/07/2012   Procedure: CYSTOSCOPY;  Surgeon: Lavonia Drafts, MD;  Location: [place] [facility];  Service: Gynecology;  Laterality: N/A;  .  LASER ABLATION    . ROBOTIC ASSISTED TOTAL HYSTERECTOMY  03/07/2012   Procedure: ROBOTIC ASSISTED TOTAL HYSTERECTOMY;  Surgeon: Lavonia Drafts, MD;  Location: [facility];  Service: Gynecology;  Laterality: N/A;  . TUBAL LIGATION      OB History    Gravida Para Term Preterm AB Living   3 3 3     3    SAB TAB Ectopic Multiple Live Births                   Home Medications    Prior to Admission medications   Medication Sig Start Date End Date Taking? Authorizing Provider  albuterol (PROVENTIL HFA;VENTOLIN HFA) 108 (90 Base) MCG/ACT inhaler Inhale 2 puffs every 4 (four) hours as needed into the lungs for wheezing or shortness of breath. 85/46/27   Bravery Ketcham, Jeanett Schlein, NP  azelastine (ASTELIN) 137 MCG/SPRAY nasal spray Place 2 sprays into both nostrils at bedtime as needed for rhinitis. 03/05/13   Colon Branch, MD  BIOTIN PO Take 1 tablet by mouth daily.    [provider]  budesonide-formoterol (SYMBICORT) 160-4.5 MCG/ACT inhaler Inhale 2 puffs into the lungs 2 (two) times daily. Patient taking differently: Inhale 2 puffs into the lungs as needed.  09/07/14   Colon Branch, MD  levofloxacin (LEVAQUIN) 500 MG tablet Take 1 tablet (500 mg total) daily by mouth. 03/50/09   Alawna Graybeal, Jeanett Schlein, NP  lisinopril-hydrochlorothiazide (PRINZIDE,ZESTORETIC) 20-25 MG tablet Take 1 tablet by mouth  daily. 02/24/16   Melony Overly, MD  metoprolol (LOPRESSOR) 50 MG tablet Take 1 tablet (50 mg total) by mouth 2 (two) times daily. 02/24/16   Melony Overly, MD  naproxen (NAPROSYN) 500 MG tablet Take 1 tablet (500 mg total) by mouth 2 (two) times daily with a meal. 10/23/13   Margarita Mail, PA-C    Family History Family History  Problem Relation Age of Onset  . Hypertension Mother        [place]  . Arthritis Mother   . Anemia Mother   . Cancer Mother        uterine?  . Diabetes Father   . Stroke Father 44  . Heart disease Father        CHF, has a defib  . Heart disease Brother        CHF  .  Cancer Other        MGM "blood cancer"  . Colon cancer Neg Hx   . Breast cancer Neg Hx     Social History Social History   Tobacco Use  . Smoking status: Current Every Day Smoker    Packs/day: 1.00    Years: 26.00    Pack years: 26.00    Types: Cigarettes    Start date: 06/21/1986  . Smokeless tobacco: Never Used  . Tobacco comment: 10-20-14 still smoking  Substance Use Topics  . Alcohol use: No    Alcohol/week: 0.0 oz  . Drug use: No     Allergies   Patient has no known allergies.   Review of Systems Review of Systems  Constitutional: Positive for fatigue. Negative for fever.  HENT: Positive for congestion.   Respiratory: Positive for cough and shortness of breath.   Cardiovascular: Negative for chest pain, palpitations and leg swelling.  Musculoskeletal: Negative for back pain.  All other systems reviewed and are negative.    Physical Exam Triage Vital Signs ED Triage Vitals  Enc Vitals Group     BP 03/08/17 1434 (!) 165/107     Pulse Rate 03/08/17 1434 64     Resp 03/08/17 1434 18     Temp 03/08/17 1434 98.5 F (36.9 C)     Temp src --      SpO2 03/08/17 1434 100 %     Weight --      Height --      Head Circumference --      Peak Flow --      Pain Score 03/08/17 1432 4     Pain Loc --      Pain Edu? --      Excl. in [place]? --    No data found.  Updated Vital Signs BP (!) 165/107   Pulse 64   Temp 98.5 F (36.9 C)   Resp 18   LMP 02/29/2012 (Exact Date)   SpO2 100%   Visual Acuity Right Eye Distance:   Left Eye Distance:   Bilateral Distance:    Right Eye Near:   Left Eye Near:    Bilateral Near:     Physical Exam  Constitutional: She is oriented to person, place, and time. She appears well-developed and well-nourished. She is active and cooperative. No distress.  HENT:  Head: Normocephalic.  Mouth/Throat: Oropharynx is clear and moist.  Eyes: Conjunctivae, EOM and lids are normal. Pupils are equal, round, and reactive to light.    Neck: Trachea normal and normal range of motion. No tracheal deviation present.  Cardiovascular: Regular rhythm,  normal heart sounds and normal pulses.  No murmur heard. Pulmonary/Chest: Effort normal. She has decreased breath sounds in the right lower field and the left lower field.  Abdominal: Soft. Bowel sounds are normal. There is no tenderness.  Musculoskeletal: Normal range of motion.  Lymphadenopathy:    She has no cervical adenopathy.  Neurological: She is alert and oriented to person, place, and time. GCS eye subscore is 4. GCS verbal subscore is 5. GCS motor subscore is 6.  Skin: Skin is warm and dry. No rash noted.  Psychiatric: She has a normal mood and affect. Her speech is normal and behavior is normal.  Nursing note and vitals reviewed.    UC Treatments / Results  Labs (all labs ordered are listed, but only abnormal results are displayed) Labs Reviewed - No data to display  EKG  EKG Interpretation None       Radiology Dg Chest 2 View  Result Date: 03/08/2017 CLINICAL DATA:  Chest congestion and shortness of breath over the last 10 days. EXAM: CHEST  2 VIEW COMPARISON:  01/04/2014.  10/13/2009. FINDINGS: Heart size is normal. Mild ectasia of the aorta. There are patchy bilateral pulmonary infiltrates in the mid and lower lungs. No dense consolidation or lobar collapse. No effusions. No evidence of pulmonary edema. No significant bone finding. IMPRESSION: Patchy bilateral pulmonary infiltrates consistent with bronchopneumonia. Follow-up to clearing recommended. Electronically Signed   By: Nelson Chimes M.D.   On: 03/08/2017 15:16    Procedures Procedures (including critical care time)  Medications Ordered in UC Medications - No data to display   Initial Impression / Assessment and Plan / UC Course  I have reviewed the triage vital signs and the nursing notes.  Pertinent labs & imaging results that were available during my care of the patient were reviewed by  me and considered in my medical decision making (see chart for details).      Rest,push fluids, take meds as directed. Return to Uc as needed. Go to Er for Cp, palpitations, fever, SOB, or worsening symptoms. Stop smoking. Pt verbalized understanding to this provider. Will script for Levaquin and albuterol for CAP.    Final Clinical Impressions(s) / UC Diagnoses   Final diagnoses:  Pneumonia of both lungs due to infectious organism, unspecified part of lung  Cigarette smoker    ED Discharge Orders        Ordered    albuterol (PROVENTIL HFA;VENTOLIN HFA) 108 (90 Base) MCG/ACT inhaler  Every 4 hours PRN     03/08/17 1600    levofloxacin (LEVAQUIN) 500 MG tablet  Daily     03/08/17 1600       Controlled Substance Prescriptions    Tayia Stonesifer, Jeanett Schlein, NP 01/75/10 [phone]

## 2017-03-31 ENCOUNTER — Encounter (HOSPITAL_COMMUNITY): Payer: Self-pay | Admitting: Emergency Medicine

## 2017-03-31 ENCOUNTER — Emergency Department (HOSPITAL_COMMUNITY)
Admission: EM | Admit: 2017-03-31 | Discharge: 2017-03-31 | Disposition: A | Discharge status: Home / Self Care | Payer: Self-pay | Attending: Emergency Medicine | Admitting: Emergency Medicine

## 2017-03-31 ENCOUNTER — Other Ambulatory Visit: Payer: Self-pay

## 2017-03-31 ENCOUNTER — Emergency Department (HOSPITAL_COMMUNITY): Payer: Self-pay

## 2017-03-31 DIAGNOSIS — J189 Pneumonia, unspecified organism: Secondary | ICD-10-CM | POA: Insufficient documentation

## 2017-03-31 DIAGNOSIS — I10 Essential (primary) hypertension: Secondary | ICD-10-CM | POA: Insufficient documentation

## 2017-03-31 DIAGNOSIS — Z79899 Other long term (current) drug therapy: Secondary | ICD-10-CM | POA: Insufficient documentation

## 2017-03-31 DIAGNOSIS — F329 Major depressive disorder, single episode, unspecified: Secondary | ICD-10-CM | POA: Insufficient documentation

## 2017-03-31 DIAGNOSIS — F419 Anxiety disorder, unspecified: Secondary | ICD-10-CM | POA: Insufficient documentation

## 2017-03-31 DIAGNOSIS — F17210 Nicotine dependence, cigarettes, uncomplicated: Secondary | ICD-10-CM | POA: Insufficient documentation

## 2017-03-31 MED ORDER — AZITHROMYCIN 250 MG PO TABS: ORAL_TABLET | ORAL | 0 refills | Status: DC

## 2017-03-31 MED ORDER — ALBUTEROL SULFATE HFA 108 (90 BASE) MCG/ACT IN AERS
1.0000 | INHALATION_SPRAY | Freq: Once | RESPIRATORY_TRACT | Status: AC
  Administered 2017-03-31: 2 via RESPIRATORY_TRACT
  Filled 2017-03-31: qty 6.7

## 2017-03-31 NOTE — ED Notes (Signed)
Declined W/C at D/C and was escorted to lobby by RN. 

## 2017-03-31 NOTE — ED Triage Notes (Addendum)
Pt has had cough since before thanksgiving. Diagnosed with pneumonia. Finished antibiotics in November. Felt better after antibiotics. Cough never went away but her body began aching again three days ago. Pt also states she needs and inhaler here because she could not get her prescription filled because it was too expensive. Denies any pain anywhere. Pt states she never had any steroid treatment, only the round of antibiotics.

## 2017-03-31 NOTE — Discharge Instructions (Addendum)
You were seen in the emergency department for continued cough since your urgent care visit in November.  Your chest x-ray appears unchanged since November.  Given that you are still having symptoms we will treat you with azithromycin, this is an antibiotic.   Please take all of your antibiotics until finished. You may develop abdominal discomfort or diarrhea from the antibiotic.  You may help offset this with probiotics which you can buy at the store (ask your pharmacist if unable to find) or get probiotics in the form of eating yogurt. Do not eat or take the probiotics until 2 hours after your antibiotic. If you are unable to tolerate these side effects follow-up with your primary care provider or return to the emergency department.   If you begin to experience any blistering, rashes, swelling, or difficulty breathing seek medical care for evaluation of potentially more serious side effects.   Please be aware that this medication may interact with other medications you are taking, please be sure to discuss your medication list with your pharmacist.   You need to follow-up with your primary care provider in 5 days for reevaluation.  If you do not have a primary care provider 1 is provided in your discharge instructions as well as information for our community clinic.  Return to the emergency department for any new or worsening symptoms including but not limited to difficulty breathing, chest pain, feeling like her heart is beating funny/quickly, is passing out.  Your blood pressure was elevated in the emergency department today.  Please be sure to get this rechecked at your primary care follow-up.  Be sure to take your medication as prescribed.  If you start to have any chest pain, difficulty breathing, change in vision, numbness or weakness return to the emergency department.

## 2017-03-31 NOTE — ED Provider Notes (Signed)
[place] EMERGENCY DEPARTMENT Provider Note   CSN: [id number] Arrival date & time: 03/31/17  1336     History   Chief Complaint Chief Complaint  Patient presents with  . Cough    HPI Kristin Burnett is a 51 y.o. female with history of tobacco abuse, hypertension, and pneumonia presents to the emergency department complaining of continued cough since initial diagnosis of pneumonia 03/08/17.  Patient was seen at urgent care on the 15th, X-ray report revealed  Patchy bilateral pulmonary infiltrates consistent with bronchopneumonia with recommended follow-up to clear. Patient was prescribed Levaquin and Ventolin, completed the antibiotic but was unable to fill the prescription for the inhaler due to cost..  States that she never really felt significantly better.  Still complaining of a persistent cough that is productive of white sputum, general malaise, and myalgias.  Denies fever, dyspnea, chest pain, palpitations, or N/V/D.   HPI  Past Medical History:  Diagnosis Date  . Bronchospasm 01/09/2013  . DUB (dysfunctional uterine bleeding)   . GERD (gastroesophageal reflux disease)    otc tums  . History of blood transfusion 2011   [facility]  . Hypertension   . Recurrent UTI, h/o urethra diverticuli 10/13/2009   Qualifier: Diagnosis of  By: Stanford Scotland MD, Jiles Garter    . Urethral diverticulum    Dr. Estill Dooms  . Yeast vaginitis    Dr. Estill Dooms    Patient Active Problem List   Diagnosis Date Noted  . Pneumonia of both lungs due to infectious organism 03/08/2017  . 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 FOM (frequency of micturition) 07/06/2014  . Phlebitis of arm 10/20/2013  . Anxiety and depression 03/05/2013  . Bronchospasm 01/09/2013  . Annual physical exam 09/19/2012  . Edema of   legs, L>R 09/19/2012  . GERD (gastroesophageal reflux disease) 09/19/2012  . LEIOMYOMA OF UTERUS, UNSPECIFIED 10/13/2009  . ANEMIA, HYPOCHROMIC 10/13/2009  . ESSENTIAL HYPERTENSION,  BENIGN 10/13/2009  . Recurrent UTI, h/o urethra diverticuli 10/13/2009  . DYSFUNCTIONAL UTERINE BLEEDING 10/13/2009    Past Surgical History:  Procedure Laterality Date  . BILATERAL SALPINGECTOMY  03/07/2012   Procedure: BILATERAL SALPINGECTOMY;  Surgeon: Lavonia Drafts, MD;  Location: [facility];  Service: Gynecology;  Laterality: Bilateral;  . CYSTOSCOPY  03/07/2012   Procedure: CYSTOSCOPY;  Surgeon: Lavonia Drafts, MD;  Location: [facility];  Service: Gynecology;  Laterality: N/A;  . LASER ABLATION    . ROBOTIC ASSISTED TOTAL HYSTERECTOMY  03/07/2012   Procedure: ROBOTIC ASSISTED TOTAL HYSTERECTOMY;  Surgeon: Lavonia Drafts, MD;  Location: [facility];  Service: Gynecology;  Laterality: N/A;  . TUBAL LIGATION      OB History    Gravida Para Term Preterm AB Living   3 3 3     3    SAB TAB Ectopic Multiple Live Births                   Home Medications    Prior to Admission medications   Medication Sig Start Date End Date Taking? Authorizing Provider  azelastine (ASTELIN) 137 MCG/SPRAY nasal spray Place 2 sprays into both nostrils at bedtime as needed for rhinitis. 03/05/13   Colon Branch, MD  azithromycin (ZITHROMAX) 250 MG tablet Take 2 tablets today then take 1 tablet per day for the next 4 days. 03/31/17   Eyal Greenhaw R, PA-C  BIOTIN PO Take 1 tablet by mouth daily.    [provider]  budesonide-formoterol (SYMBICORT) 160-4.5 MCG/ACT inhaler Inhale 2 puffs into the lungs 2 (  two) times daily. Patient taking differently: Inhale 2 puffs into the lungs as needed.  09/07/14   Colon Branch, MD  lisinopril-hydrochlorothiazide (PRINZIDE,ZESTORETIC) 20-25 MG tablet Take 1 tablet by mouth daily. 02/24/16   Melony Overly, MD  metoprolol (LOPRESSOR) 50 MG tablet Take 1 tablet (50 mg total) by mouth 2 (two) times daily. 02/24/16   Melony Overly, MD  naproxen (NAPROSYN) 500 MG tablet Take 1 tablet (500 mg total) by mouth 2 (two) times daily with a meal. 10/23/13    Margarita Mail, PA-C    Family History Family History  Problem Relation Age of Onset  . Hypertension Mother        [place]  . Arthritis Mother   . Anemia Mother   . Cancer Mother        uterine?  . Diabetes Father   . Stroke Father 42  . Heart disease Father        CHF, has a defib  . Heart disease Brother        CHF  . Cancer Other        MGM "blood cancer"  . Colon cancer Neg Hx   . Breast cancer Neg Hx     Social History Social History   Tobacco Use  . Smoking status: Current Every Day Smoker    Packs/day: 1.00    Years: 26.00    Pack years: 26.00    Types: Cigarettes    Start date: 06/21/1986  . Smokeless tobacco: Never Used  . Tobacco comment: 10-20-14 still smoking  Substance Use Topics  . Alcohol use: No    Alcohol/week: 0.0 oz  . Drug use: No     Allergies   Patient has no known allergies.   Review of Systems Review of Systems  Constitutional: Positive for fatigue. Negative for chills and fever.  HENT: Negative for congestion, ear pain and sore throat.   Respiratory: Positive for cough. Negative for shortness of breath.   Cardiovascular: Negative for chest pain and palpitations.  Gastrointestinal: Negative for abdominal pain, diarrhea, nausea and vomiting.  Genitourinary: Negative for dysuria.  Musculoskeletal: Positive for myalgias. Negative for neck pain.  Neurological: Negative for syncope, weakness and numbness.  All other systems reviewed and are negative.    Physical Exam Updated Vital Signs BP (!) 163/105 (BP Location: Right Arm)   Pulse 99   Temp 98.4 F (36.9 C) (Oral)   Resp 20   Ht 5\' 4"  (1.626 m)   Wt 117 kg (258 lb)   LMP 02/29/2012 (Exact Date)   SpO2 98%   BMI 44.29 kg/m   Physical Exam  Constitutional: She appears well-developed and well-nourished. No distress.  HENT:  Head: Normocephalic and atraumatic.  Right Ear: Tympanic membrane is not perforated, not erythematous, not retracted and not bulging.  Left Ear:  Tympanic membrane is not perforated, not erythematous, not retracted and not bulging.  Mouth/Throat: Uvula is midline and oropharynx is clear and moist. No oropharyngeal exudate or posterior oropharyngeal erythema.  Eyes: Conjunctivae are normal. Pupils are equal, round, and reactive to light. Right eye exhibits no discharge. Left eye exhibits no discharge.  Neck: Normal range of motion. Neck supple.  Cardiovascular: Normal rate and regular rhythm.  No murmur heard. Pulmonary/Chest: No respiratory distress. She has no wheezes. She has rales (bibasilar).  Abdominal: Soft. She exhibits no distension. There is no tenderness.  Musculoskeletal: She exhibits no edema.  Lymphadenopathy:    She has no cervical adenopathy.  Neurological:  She is alert.  Skin: Skin is warm and dry. No rash noted.  Psychiatric: She has a normal mood and affect. Her behavior is normal.  Nursing note and vitals reviewed.    ED Treatments / Results  Labs (all labs ordered are listed, but only abnormal results are displayed) Labs Reviewed - No data to display  EKG  EKG Interpretation None      Radiology Dg Chest 2 View  Result Date: 03/31/2017 CLINICAL DATA:  Recent pneumonia with persistent cough and chest tightness EXAM: CHEST  2 VIEW COMPARISON:  March 08, 2017 FINDINGS: Patchy opacity remains in the mid and lower lung zones on the right and to a lesser extent in the left mid lower lung zones. No new opacity is evident on either side. Heart size and pulmonary vascular normal. No adenopathy. IMPRESSION: Patchy infiltrate in both mid and lower lung zones persists with no appreciable change on the right and minimal clearing in the left mid lung region. No new opacity. Stable cardiac silhouette. No evident adenopathy. Electronically Signed   By: Lowella Grip III M.D.   On: 03/31/2017 14:09    Procedures Procedures (including critical care time)  Medications Ordered in ED Medications  albuterol  (PROVENTIL HFA;VENTOLIN HFA) 108 (90 Base) MCG/ACT inhaler 1-2 puff (2 puffs Inhalation Given 03/31/17 1648)    Initial Impression / Assessment and Plan / ED Course  I have reviewed the triage vital signs and the nursing notes.  Pertinent labs & imaging results that were available during my care of the patient were reviewed by me and considered in my medical decision making (see chart for details).   Patient presents to the emergency department complaining of persistent cough since treatment for pneumonia with Levaquin starting 11/15.  She completed course of antibiotics, however was unable to get inhaler filled that was prescribed for her.  She is nontoxic-appearing with stable vital signs  Repeat x-ray as above.  She is hemodynamically stable, she is afebrile, she is not tachycardic, and there is no signs of respiratory distress. Given patient has had persistent symptoms we will treat for community-acquired pneumonia with azithromycin with close primary care follow-up outpatient.   Inhaler provided in the emergency department. Discussed strict return precautions. Patient's blood pressure elevated in the emergency department today. Patient denies headache, change in vision, numbness, weakness, chest pain, dyspnea, dizziness, or lightheadedness therefore doubt hypertensive emergency. Discussed elevated blood pressure with the patient- she relays she did not take her blood pressure medication today. Discussed need to take BP medication as prescribed and the need for primary care follow up for recheck. Discussed return precaution signs/symptoms for hypertensive emergency as listed above with the patient. She confirmed understanding.    I discussed results, treatment plan, need for close PCP follow-up, and return precautions with the patient. Provided opportunity for questions, patient confirmed understanding and is in agreement with plan.   Findings and plan of care discussed with supervising physician Dr.  Rogene Houston who is in agreement with plan.   Final Clinical Impressions(s) / ED Diagnoses   Final diagnoses:  Community acquired pneumonia, unspecified laterality    ED Discharge Orders        Ordered    azithromycin (ZITHROMAX) 250 MG tablet     03/31/17 [address], [place] R, PA-C 03/31/17 1724    Fredia Sorrow, MD 04/01/17 [phone]

## 2017-04-29 ENCOUNTER — Other Ambulatory Visit: Payer: Self-pay

## 2017-04-29 ENCOUNTER — Inpatient Hospital Stay (HOSPITAL_COMMUNITY)
Admission: EM | Admit: 2017-04-29 | Discharge: 2017-05-03 | DRG: 193 | Disposition: A | Discharge status: Home / Self Care | Payer: Self-pay | Attending: Internal Medicine | Admitting: Internal Medicine

## 2017-04-29 ENCOUNTER — Emergency Department (HOSPITAL_COMMUNITY): Payer: Self-pay

## 2017-04-29 ENCOUNTER — Encounter (HOSPITAL_COMMUNITY): Payer: Self-pay

## 2017-04-29 DIAGNOSIS — Z9889 Other specified postprocedural states: Secondary | ICD-10-CM

## 2017-04-29 DIAGNOSIS — E871 Hypo-osmolality and hyponatremia: Secondary | ICD-10-CM | POA: Diagnosis present

## 2017-04-29 DIAGNOSIS — J9601 Acute respiratory failure with hypoxia: Secondary | ICD-10-CM | POA: Diagnosis present

## 2017-04-29 DIAGNOSIS — I10 Essential (primary) hypertension: Secondary | ICD-10-CM | POA: Diagnosis present

## 2017-04-29 DIAGNOSIS — J181 Lobar pneumonia, unspecified organism: Secondary | ICD-10-CM

## 2017-04-29 DIAGNOSIS — J9801 Acute bronchospasm: Secondary | ICD-10-CM | POA: Diagnosis present

## 2017-04-29 DIAGNOSIS — Z77120 Contact with and (suspected) exposure to mold (toxic): Secondary | ICD-10-CM

## 2017-04-29 DIAGNOSIS — Z7951 Long term (current) use of inhaled steroids: Secondary | ICD-10-CM

## 2017-04-29 DIAGNOSIS — F419 Anxiety disorder, unspecified: Secondary | ICD-10-CM | POA: Diagnosis present

## 2017-04-29 DIAGNOSIS — F17210 Nicotine dependence, cigarettes, uncomplicated: Secondary | ICD-10-CM | POA: Diagnosis present

## 2017-04-29 DIAGNOSIS — N179 Acute kidney failure, unspecified: Secondary | ICD-10-CM | POA: Diagnosis present

## 2017-04-29 DIAGNOSIS — R748 Abnormal levels of other serum enzymes: Secondary | ICD-10-CM

## 2017-04-29 DIAGNOSIS — R918 Other nonspecific abnormal finding of lung field: Secondary | ICD-10-CM | POA: Diagnosis present

## 2017-04-29 DIAGNOSIS — N289 Disorder of kidney and ureter, unspecified: Secondary | ICD-10-CM | POA: Diagnosis present

## 2017-04-29 DIAGNOSIS — J441 Chronic obstructive pulmonary disease with (acute) exacerbation: Secondary | ICD-10-CM | POA: Diagnosis not present

## 2017-04-29 DIAGNOSIS — J9383 Other pneumothorax: Secondary | ICD-10-CM

## 2017-04-29 DIAGNOSIS — J95811 Postprocedural pneumothorax: Secondary | ICD-10-CM | POA: Diagnosis not present

## 2017-04-29 DIAGNOSIS — E872 Acidosis: Secondary | ICD-10-CM | POA: Diagnosis present

## 2017-04-29 DIAGNOSIS — R0682 Tachypnea, not elsewhere classified: Secondary | ICD-10-CM

## 2017-04-29 DIAGNOSIS — J189 Pneumonia, unspecified organism: Principal | ICD-10-CM | POA: Diagnosis present

## 2017-04-29 DIAGNOSIS — F329 Major depressive disorder, single episode, unspecified: Secondary | ICD-10-CM | POA: Diagnosis present

## 2017-04-29 DIAGNOSIS — J440 Chronic obstructive pulmonary disease with acute lower respiratory infection: Secondary | ICD-10-CM | POA: Diagnosis present

## 2017-04-29 DIAGNOSIS — K219 Gastro-esophageal reflux disease without esophagitis: Secondary | ICD-10-CM | POA: Diagnosis present

## 2017-04-29 HISTORY — DX: Pneumonia, unspecified organism: J18.9

## 2017-04-29 LAB — BASIC METABOLIC PANEL
Anion gap: 9 (ref 5–15)
BUN: 18 mg/dL (ref 6–20)
CALCIUM: 9.1 mg/dL (ref 8.9–10.3)
CO2: 23 mmol/L (ref 22–32)
Chloride: 101 mmol/L (ref 101–111)
Creatinine, Ser: 1.72 mg/dL — ABNORMAL HIGH (ref 0.44–1.00)
GFR calc Af Amer: 39 mL/min — ABNORMAL LOW (ref >60)
GFR, EST NON AFRICAN AMERICAN: 34 mL/min — AB (ref >60)
GLUCOSE: 122 mg/dL — AB (ref 65–99)
POTASSIUM: 3.8 mmol/L (ref 3.5–5.1)
SODIUM: 133 mmol/L — AB (ref 135–145)

## 2017-04-29 LAB — RESPIRATORY PANEL BY PCR
ADENOVIRUS-RVPPCR: NOT DETECTED
Bordetella pertussis: NOT DETECTED
CHLAMYDOPHILA PNEUMONIAE-RVPPCR: NOT DETECTED
CORONAVIRUS NL63-RVPPCR: NOT DETECTED
CORONAVIRUS OC43-RVPPCR: NOT DETECTED
Coronavirus 229E: NOT DETECTED
Coronavirus HKU1: NOT DETECTED
INFLUENZA A-RVPPCR: NOT DETECTED
Influenza B: NOT DETECTED
MYCOPLASMA PNEUMONIAE-RVPPCR: NOT DETECTED
Metapneumovirus: NOT DETECTED
PARAINFLUENZA VIRUS 1-RVPPCR: NOT DETECTED
PARAINFLUENZA VIRUS 4-RVPPCR: NOT DETECTED
Parainfluenza Virus 2: NOT DETECTED
Parainfluenza Virus 3: NOT DETECTED
Respiratory Syncytial Virus: NOT DETECTED
Rhinovirus / Enterovirus: NOT DETECTED

## 2017-04-29 LAB — CBC
HCT: 33.8 % — ABNORMAL LOW (ref 36.0–46.0)
HEMATOCRIT: 36.3 % (ref 36.0–46.0)
HEMOGLOBIN: 11.5 g/dL — AB (ref 12.0–15.0)
HEMOGLOBIN: 10.5 g/dL — AB (ref 12.0–15.0)
MCH: 27.7 pg (ref 26.0–34.0)
MCH: 27.2 pg (ref 26.0–34.0)
MCHC: 31.7 g/dL (ref 30.0–36.0)
MCHC: 31.1 g/dL (ref 30.0–36.0)
MCV: 87.5 fL (ref 78.0–100.0)
MCV: 87.6 fL (ref 78.0–100.0)
PLATELETS: 274 10*3/uL (ref 150–400)
Platelets: 291 10*3/uL (ref 150–400)
RBC: 4.15 MIL/uL (ref 3.87–5.11)
RBC: 3.86 MIL/uL — ABNORMAL LOW (ref 3.87–5.11)
RDW: 13.9 % (ref 11.5–15.5)
RDW: 14.1 % (ref 11.5–15.5)
WBC: 15.9 10*3/uL — ABNORMAL HIGH (ref 4.0–10.5)
WBC: 14.9 10*3/uL — ABNORMAL HIGH (ref 4.0–10.5)

## 2017-04-29 LAB — CREATININE, SERUM
CREATININE: 1.59 mg/dL — AB (ref 0.44–1.00)
GFR calc Af Amer: 43 mL/min — ABNORMAL LOW (ref >60)
GFR, EST NON AFRICAN AMERICAN: 37 mL/min — AB (ref >60)

## 2017-04-29 LAB — INFLUENZA PANEL BY PCR (TYPE A & B)
INFLBPCR: NEGATIVE
Influenza A By PCR: NEGATIVE

## 2017-04-29 LAB — I-STAT BETA HCG BLOOD, ED (MC, WL, AP ONLY)

## 2017-04-29 LAB — I-STAT TROPONIN, ED: TROPONIN I, POC: 0 ng/mL (ref 0.00–0.08)

## 2017-04-29 MED ORDER — METOPROLOL TARTRATE 50 MG PO TABS
50.0000 mg | ORAL_TABLET | Freq: Two times a day (BID) | ORAL | Status: DC
  Administered 2017-04-29 – 2017-04-30 (×3): 50 mg via ORAL
  Filled 2017-04-29 (×2): qty 2
  Filled 2017-04-29 (×2): qty 1

## 2017-04-29 MED ORDER — ONDANSETRON HCL 4 MG/2ML IJ SOLN: 4.0000 mg | Freq: Four times a day (QID) | INTRAMUSCULAR | Status: DC | PRN

## 2017-04-29 MED ORDER — SODIUM CHLORIDE 0.9 % IV BOLUS (SEPSIS)
1000.0000 mL | Freq: Once | INTRAVENOUS | Status: AC
  Administered 2017-04-29: 1000 mL via INTRAVENOUS

## 2017-04-29 MED ORDER — POLYETHYLENE GLYCOL 3350 17 G PO PACK
17.0000 g | PACK | Freq: Every day | ORAL | Status: DC
  Filled 2017-04-29 (×3): qty 1

## 2017-04-29 MED ORDER — ENOXAPARIN SODIUM 40 MG/0.4ML ~~LOC~~ SOLN
40.0000 mg | SUBCUTANEOUS | Status: DC
  Administered 2017-04-30 – 2017-05-02 (×4): 40 mg via SUBCUTANEOUS
  Filled 2017-04-29 (×4): qty 0.4

## 2017-04-29 MED ORDER — DEXTROSE 5 % IV SOLN
500.0000 mg | INTRAVENOUS | Status: DC
  Administered 2017-04-30 – 2017-05-03 (×3): 500 mg via INTRAVENOUS
  Filled 2017-04-29 (×4): qty 500

## 2017-04-29 MED ORDER — ONDANSETRON HCL 4 MG PO TABS: 4.0000 mg | ORAL_TABLET | Freq: Four times a day (QID) | ORAL | Status: DC | PRN

## 2017-04-29 MED ORDER — ACETAMINOPHEN 325 MG PO TABS
650.0000 mg | ORAL_TABLET | Freq: Four times a day (QID) | ORAL | Status: DC | PRN
  Administered 2017-05-01 – 2017-05-02 (×4): 650 mg via ORAL
  Filled 2017-04-29 (×4): qty 2

## 2017-04-29 MED ORDER — SODIUM CHLORIDE 0.9 % IV BOLUS (SEPSIS)
500.0000 mL | Freq: Once | INTRAVENOUS | Status: AC
  Administered 2017-04-29: 500 mL via INTRAVENOUS

## 2017-04-29 MED ORDER — DEXTROSE 5 % IV SOLN
1.0000 g | Freq: Once | INTRAVENOUS | Status: AC
  Administered 2017-04-29: 1 g via INTRAVENOUS
  Filled 2017-04-29: qty 10

## 2017-04-29 MED ORDER — IPRATROPIUM-ALBUTEROL 0.5-2.5 (3) MG/3ML IN SOLN: 3.0000 mL | RESPIRATORY_TRACT | Status: DC | PRN

## 2017-04-29 MED ORDER — SODIUM CHLORIDE 0.9 % IV SOLN
INTRAVENOUS | Status: DC
  Administered 2017-04-29 – 2017-04-30 (×2): via INTRAVENOUS
  Administered 2017-04-30: 1000 mL via INTRAVENOUS
  Administered 2017-05-01 – 2017-05-03 (×4): via INTRAVENOUS

## 2017-04-29 MED ORDER — ACETAMINOPHEN 650 MG RE SUPP: 650.0000 mg | Freq: Four times a day (QID) | RECTAL | Status: DC | PRN

## 2017-04-29 MED ORDER — DEXTROSE 5 % IV SOLN
1.0000 g | INTRAVENOUS | Status: DC
  Administered 2017-04-30 – 2017-05-03 (×3): 1 g via INTRAVENOUS
  Filled 2017-04-29 (×4): qty 10

## 2017-04-29 MED ORDER — BENZONATATE 100 MG PO CAPS
100.0000 mg | ORAL_CAPSULE | Freq: Three times a day (TID) | ORAL | Status: DC | PRN
  Administered 2017-04-29: 100 mg via ORAL
  Filled 2017-04-29: qty 1

## 2017-04-29 MED ORDER — DEXTROSE 5 % IV SOLN
500.0000 mg | Freq: Once | INTRAVENOUS | Status: AC
  Administered 2017-04-29: 500 mg via INTRAVENOUS
  Filled 2017-04-29: qty 500

## 2017-04-29 MED ORDER — PANTOPRAZOLE SODIUM 40 MG PO TBEC
40.0000 mg | DELAYED_RELEASE_TABLET | Freq: Every day | ORAL | Status: DC
  Administered 2017-04-30 – 2017-05-03 (×3): 40 mg via ORAL
  Filled 2017-04-29 (×4): qty 1

## 2017-04-29 NOTE — ED Triage Notes (Signed)
Patient complains of ongoing chest pain and pain when she takes a deep breath. Patient states that she has ben seen x 2 for same and treated for pneumonia. Alert and oriented, NAD

## 2017-04-29 NOTE — ED Provider Notes (Signed)
[place] EMERGENCY DEPARTMENT Provider Note   CSN: [id number] Arrival date & time: 04/29/17  1043     History   Chief Complaint No chief complaint on file.   HPI Kristin Burnett is a 52 y.o. female presenting for evaluation of chest pain and cough.  Pt states that yesterday she started to feel poorly.  She reports chest pain, shortness of breath, cough, and generalized body aches.  Last night, the pain was worse, she had to sleep sitting upright.  She reports associated shortness of breath because she feels this chest pressure.  The pressure is central.  She states it feels similar to when she had pneumonia last month and the month before, although it feels worse than previous times. She has not taken anything for pain including Tylenol or ibuprofen.  It does not radiate anywhere.  Nothing makes it better, movement and coughing and breathing deep makes it worse.  She denies fevers, chills, sore throat, nasal congestion, nausea, vomiting, abdominal pain, urinary symptoms, abnormal bowel movements.  She denies leg pain or swelling.  She denies extended travel, surgery, immobilization in the past 6 months.  She is not on birth control.  She denies history of cancer or blood clots.  She denies cardiac or pulmonary history, although states that she has an albuterol inhaler which did not improve her symptoms.  Patient states she was last treated for pneumonia beginning of December, she took a 5-day prescription and symptoms resolved completely after that.  She had no issues until yesterday.  Pt states she has a history of hypertension, but she is not taking her medication daily.  She did not have her medication today.  HPI  Past Medical History:  Diagnosis Date  . Bronchospasm 01/09/2013  . DUB (dysfunctional uterine bleeding)   . GERD (gastroesophageal reflux disease)    otc tums  . [facility] of blood transfusion 2011   [facility]  . Hypertension   . Recurrent UTI, h/o  urethra diverticuli 10/13/2009   Qualifier: Diagnosis of  By: Stanford Scotland MD, Jiles Garter    . Urethral diverticulum    Dr. Estill Dooms  . Yeast vaginitis    Dr. Estill Dooms    Patient Active Problem List   Diagnosis Date Noted  . Tachypnea 04/29/2017  . Pneumonia 04/29/2017  . Pneumonia of both lungs due to infectious organism 03/08/2017  . 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 FOM (frequency of micturition) 07/06/2014  . Phlebitis of arm 10/20/2013  . Anxiety and depression 03/05/2013  . Bronchospasm 01/09/2013  . Annual physical exam 09/19/2012  . Edema of   legs, L>R 09/19/2012  . GERD (gastroesophageal reflux disease) 09/19/2012  . LEIOMYOMA OF UTERUS, UNSPECIFIED 10/13/2009  . ANEMIA, HYPOCHROMIC 10/13/2009  . Essential hypertension, benign 10/13/2009  . Recurrent UTI, h/o urethra diverticuli 10/13/2009  . DYSFUNCTIONAL UTERINE BLEEDING 10/13/2009    Past Surgical History:  Procedure Laterality Date  . BILATERAL SALPINGECTOMY  03/07/2012   Procedure: BILATERAL SALPINGECTOMY;  Surgeon: Lavonia Drafts, MD;  Location: [facility];  Service: Gynecology;  Laterality: Bilateral;  . CYSTOSCOPY  03/07/2012   Procedure: CYSTOSCOPY;  Surgeon: Lavonia Drafts, MD;  Location: [facility];  Service: Gynecology;  Laterality: N/A;  . LASER ABLATION    . ROBOTIC ASSISTED TOTAL HYSTERECTOMY  03/07/2012   Procedure: ROBOTIC ASSISTED TOTAL HYSTERECTOMY;  Surgeon: Lavonia Drafts, MD;  Location: [facility];  Service: Gynecology;  Laterality: N/A;  . TUBAL LIGATION      OB History    Gravida Para  Term Preterm AB Living   3 3 3     3    SAB TAB Ectopic Multiple Live Births                   Home Medications    Prior to Admission medications   Medication Sig Start Date End Date Taking? Authorizing Provider  azelastine (ASTELIN) 137 MCG/SPRAY nasal spray Place 2 sprays into both nostrils at bedtime as needed for rhinitis. 03/05/13   Colon Branch, MD  azithromycin (ZITHROMAX) 250 MG tablet Take 2  tablets today then take 1 tablet per day for the next 4 days. 03/31/17   Petrucelli, Samantha R, PA-C  BIOTIN PO Take 1 tablet by mouth daily.    [provider]  budesonide-formoterol (SYMBICORT) 160-4.5 MCG/ACT inhaler Inhale 2 puffs into the lungs 2 (two) times daily. Patient taking differently: Inhale 2 puffs into the lungs as needed.  09/07/14   Colon Branch, MD  lisinopril-hydrochlorothiazide (PRINZIDE,ZESTORETIC) 20-25 MG tablet Take 1 tablet by mouth daily. 02/24/16   Melony Overly, MD  metoprolol (LOPRESSOR) 50 MG tablet Take 1 tablet (50 mg total) by mouth 2 (two) times daily. 02/24/16   Melony Overly, MD  naproxen (NAPROSYN) 500 MG tablet Take 1 tablet (500 mg total) by mouth 2 (two) times daily with a meal. 10/23/13   Margarita Mail, PA-C    Family History Family History  Problem Relation Age of Onset  . Hypertension Mother        [place]  . Arthritis Mother   . Anemia Mother   . Cancer Mother        uterine?  . Diabetes Father   . Stroke Father 36  . Heart disease Father        CHF, has a defib  . Heart disease Brother        CHF  . Cancer Other        MGM "blood cancer"  . Colon cancer Neg Hx   . Breast cancer Neg Hx     Social History Social History   Tobacco Use  . Smoking status: Current Every Day Smoker    Packs/day: 1.00    Years: 26.00    Pack years: 26.00    Types: Cigarettes    Start date: 06/21/1986  . Smokeless tobacco: Never Used  . Tobacco comment: 10-20-14 still smoking  Substance Use Topics  . Alcohol use: No    Alcohol/week: 0.0 oz  . Drug use: No     Allergies   Patient has no known allergies.   Review of Systems Review of Systems  Respiratory: Positive for cough, chest tightness and shortness of breath.   Cardiovascular: Positive for chest pain.  Musculoskeletal: Positive for myalgias.  All other systems reviewed and are negative.    Physical Exam Updated Vital Signs BP 126/86   Pulse (!) 102   Temp 99.1 F (37.3 C) (Oral)    Resp (!) 31   LMP 02/29/2012 (Exact Date)   SpO2 96%   Physical Exam  Constitutional: She is oriented to person, place, and time. She appears well-developed and well-nourished. No distress.  HENT:  Head: Normocephalic and atraumatic.  Eyes: Conjunctivae and EOM are normal. Pupils are equal, round, and reactive to light.  Neck: Normal range of motion. Neck supple.  Cardiovascular: Normal rate, regular rhythm and intact distal pulses.  Pulmonary/Chest: Effort normal. No respiratory distress. She has decreased breath sounds. She has no wheezes. She exhibits tenderness (  TTP of central anterior chest wall).  Decreased breath sounds at the bases bilaterally.  Patient speaking full sentences without difficulty.  Abdominal: Soft. She exhibits no distension and no mass. There is no tenderness. There is no guarding.  Musculoskeletal: Normal range of motion. She exhibits no edema or tenderness.  No leg swelling or tenderness.  Pedal pulses intact bilaterally  Neurological: She is alert and oriented to person, place, and time.  Skin: Skin is warm and dry.  Psychiatric: She has a normal mood and affect.  Nursing note and vitals reviewed.    ED Treatments / Results  Labs (all labs ordered are listed, but only abnormal results are displayed) Labs Reviewed  BASIC METABOLIC PANEL - Abnormal; Notable for the following components:      Result Value   Sodium 133 (*)    Glucose, Bld 122 (*)    Creatinine, Ser 1.72 (*)    GFR calc non Af Amer 34 (*)    GFR calc Af Amer 39 (*)    All other components within normal limits  CBC - Abnormal; Notable for the following components:   WBC 15.9 (*)    Hemoglobin 11.5 (*)    All other components within normal limits  RESPIRATORY PANEL BY PCR  CULTURE, BLOOD (ROUTINE X 2)  CULTURE, BLOOD (ROUTINE X 2)  CULTURE, EXPECTORATED SPUTUM-ASSESSMENT  GRAM STAIN  INFLUENZA PANEL BY PCR (TYPE A & B)  HIV ANTIBODY (ROUTINE TESTING)  CBC  CREATININE, SERUM    COMPREHENSIVE METABOLIC PANEL  CBC  STREP PNEUMONIAE URINARY ANTIGEN  I-STAT TROPONIN, ED  I-STAT BETA HCG BLOOD, ED ([facility], [facility], [facility] ONLY)    EKG  EKG Interpretation None       Radiology Dg Chest 2 View  Result Date: 04/29/2017 CLINICAL DATA:  Chest pain when she takes a deep breath. Has been ongoing since December. EXAM: CHEST  2 VIEW COMPARISON:  03/31/2017, 01/04/2014 FINDINGS: There is bilateral chronic interstitial lung disease. There is hazy left lower lobe airspace disease concerning for pneumonia. There is no pleural effusion or pneumothorax. The heart and mediastinal contours are unremarkable. The osseous structures are unremarkable. IMPRESSION: 1. Hazy left lower lobe airspace disease concerning for pneumonia. Electronically Signed   By: Kathreen Devoid   On: 04/29/2017 12:18   Ct Chest Wo Contrast  Result Date: 04/29/2017 CLINICAL DATA:  Cough with persistent shortness of breath. EXAM: CT CHEST WITHOUT CONTRAST TECHNIQUE: Multidetector CT imaging of the chest was performed following the standard protocol without IV contrast. COMPARISON:  None. FINDINGS: Cardiovascular: Heart is enlarged. Trace pericardial effusion noted. Coronary artery calcification is evident. Mediastinum/Nodes: No mediastinal lymphadenopathy. No evidence for gross hilar lymphadenopathy although assessment is limited by the lack of intravenous contrast on today's study. The esophagus has normal imaging features. Small subpectoral and axillary lymph nodes are seen bilaterally. Lungs/Pleura: Areas of atelectasis and ill-defined ground-glass attenuation are noted bilaterally with scattered areas of focal airspace consolidation. Areas in both suggests a "reverse halo sign" . Scattered tiny ill-defined pulmonary nodules are evident bilaterally. Tiny left pleural effusion noted. Upper Abdomen: Liver is prominent but incompletely visualized. Irregular contour right upper pole kidney may be scarring although mass lesion not  excluded. Musculoskeletal: Bone windows reveal no worrisome lytic or sclerotic osseous lesions. IMPRESSION: 1. Bilateral airspace opacities, some of which have a a more dense rim and central ground-glass attenuation. Organizing pneumonia of infectious/inflammatory etiology would be a consideration. 2. Scattered small bilateral pulmonary nodules, potentially postinfectious/postinflammatory. Metastatic disease cannot be  excluded. Follow-up is recommended to ensure resolution/stability. Repeat CT chest in 3 months recommended. 3. Irregular contour upper pole right kidney. This may be related to cortical scarring but renal mass is a concern. Follow-up CT or MRI of the abdomen recommended to further evaluate. Electronically Signed   By: Misty Stanley M.D.   On: 04/29/2017 16:14    Procedures Procedures (including critical care time)  Medications Ordered in ED Medications  azithromycin (ZITHROMAX) 500 mg in dextrose 5 % 250 mL IVPB (500 mg Intravenous New Bag/Given 04/29/17 1644)  enoxaparin (LOVENOX) injection 40 mg (not administered)  acetaminophen (TYLENOL) tablet 650 mg (not administered)    Or  acetaminophen (TYLENOL) suppository 650 mg (not administered)  polyethylene glycol (MIRALAX / GLYCOLAX) packet 17 g (not administered)  ondansetron (ZOFRAN) tablet 4 mg (not administered)    Or  ondansetron (ZOFRAN) injection 4 mg (not administered)  ipratropium-albuterol (DUONEB) 0.5-2.5 (3) MG/3ML nebulizer solution 3 mL (not administered)  benzonatate (TESSALON) capsule 100 mg (not administered)  0.9 %  sodium chloride infusion (not administered)  pantoprazole (PROTONIX) EC tablet 40 mg (not administered)  cefTRIAXone (ROCEPHIN) 1 g in dextrose 5 % 50 mL IVPB (not administered)  azithromycin (ZITHROMAX) 500 mg in dextrose 5 % 250 mL IVPB (not administered)  metoprolol tartrate (LOPRESSOR) tablet 50 mg (not administered)  sodium chloride 0.9 % bolus 500 mL (0 mLs Intravenous Stopped 04/29/17 1708)    cefTRIAXone (ROCEPHIN) 1 g in dextrose 5 % 50 mL IVPB (0 g Intravenous Stopped 04/29/17 1644)  sodium chloride 0.9 % bolus 1,000 mL (1,000 mLs Intravenous New Bag/Given 04/29/17 1710)     Initial Impression / Assessment and Plan / ED Course  I have reviewed the triage vital signs and the nursing notes.  Pertinent labs & imaging results that were available during my care of the patient were reviewed by me and considered in my medical decision making (see chart for details).     Patient presenting for evaluation of chest pain, shortness breath, and cough.  Physical exam shows patient who appears nontoxic.  Her heart rate is slightly elevated in the upper 90s.  Blood pressure is soft, she did not take her blood pressure medicine this morning.  Lung sounds decreased bilaterally at the bases.  She is speaking full sentences without signs of respiratory distress.  She is afebrile.  Does not meet septic criteria.  Chest x-ray shows pneumonia.  As this is the third time patient has had pneumonia in the past 3 months, will obtain CT scan for further evaluation.  Patient with elevated creatinine, will do CT chest without contrast.  Labs show elevated white count, slightly dehydrated, elevated creatinine.  Troponin negative.  EKG without signs of STEMI.  Will start IV fluids and antibiotics.  CT chest shows organizing pneumonia, cannot rule out malignancy.  Discussed with attending, Dr. Ellender Hose agrees with plan.  Will admit to hospitalist service for further management.  Final Clinical Impressions(s) / ED Diagnoses   Final diagnoses:  Pneumonia of left lower lobe due to infectious organism [facility])  AKI (acute kidney injury) [facility])    ED Discharge Orders    None       Franchot Heidelberg, PA-C 04/29/17 1741    Duffy Bruce, MD 04/30/17 [phone]

## 2017-04-29 NOTE — ED Notes (Signed)
Admitting provider at bedside.

## 2017-04-29 NOTE — H&P (Addendum)
History and Physical    Kristin Burnett ZWC:[id number] DOB: [birth year] DOA: 04/29/2017  PCP: Patient, No Pcp Per  Patient coming from: Home  Chief Complaint: Cough, malaise, sob  HPI: Kristin Burnett is a 52 y.o. female with medical history significant of bronchospasm, HTN, recently treated CAP who presented to the ED with worsening cough, malaise, muscle soreness. Patient was initially seen on 11/15 in urgent care for cough, found to have bronchopneumonia and prescribed levaquin and albuterol. Patient seen in ED for follow up on 12/5 with continued symptoms. Patient was discharged from ED with Zpak. Patient completed course with some improvement, however after finishing course, symptoms again worsened. Patient reports chest tightness with myalgias involving shoulders, chills, weakness, decreased appetite.   ED Course: In the ED, patient was noted to have increased tachypnea with RR in the 30's, CXR with findings suggestive of LLL airspace disease. CT chest with findings worrisome for bilateral airspace disease concerning for organizing PNA of infectious/inflammatory etiology. Also noted were scattered small B pulmonary nodules, potentially infectious/inflammatory although metastatic disease cannot be excluded. Patient was started on empiric azithromycin and rocephin. Flu and respiratory viral panel ordered. Hospitalist consulted for consideration for admission.  Review of Systems:  Review of Systems  Constitutional: Positive for malaise/fatigue. Negative for chills.  HENT: Negative for ear discharge, ear pain, nosebleeds and tinnitus.   Eyes: Negative for double vision, photophobia and pain.  Respiratory: Positive for cough and shortness of breath. Negative for hemoptysis.   Cardiovascular: Negative for chest pain, palpitations and orthopnea.  Gastrointestinal: Negative for abdominal pain, nausea and vomiting.  Genitourinary: Negative for frequency, hematuria and urgency.  Musculoskeletal:  Negative for back pain, falls, joint pain and neck pain.  Neurological: Positive for weakness. Negative for tingling, tremors, seizures, loss of consciousness and headaches.  Psychiatric/Behavioral: Negative for hallucinations and memory loss. The patient does not have insomnia.     Past Medical History:  Diagnosis Date  . Bronchospasm 01/09/2013  . DUB (dysfunctional uterine bleeding)   . GERD (gastroesophageal reflux disease)    [facility]  . History of blood transfusion 2011   [facility]  . Hypertension   . Recurrent UTI, h/o urethra diverticuli 10/13/2009   Qualifier: Diagnosis of  By: Stanford Scotland MD, Jiles Garter    . Urethral diverticulum    Dr. Estill Dooms  . Yeast vaginitis    Dr. Estill Dooms    Past Surgical History:  Procedure Laterality Date  . BILATERAL SALPINGECTOMY  03/07/2012   Procedure: BILATERAL SALPINGECTOMY;  Surgeon: Lavonia Drafts, MD;  Location: [facility];  Service: Gynecology;  Laterality: Bilateral;  . CYSTOSCOPY  03/07/2012   Procedure: CYSTOSCOPY;  Surgeon: Lavonia Drafts, MD;  Location: [facility];  Service: Gynecology;  Laterality: N/A;  . LASER ABLATION    . ROBOTIC ASSISTED TOTAL HYSTERECTOMY  03/07/2012   Procedure: ROBOTIC ASSISTED TOTAL HYSTERECTOMY;  Surgeon: Lavonia Drafts, MD;  Location: [facility];  Service: Gynecology;  Laterality: N/A;  . TUBAL LIGATION       reports that she has been smoking cigarettes.  She started smoking about 30 years ago. She has a 26.00 pack-year smoking history. she has never used smokeless tobacco. She reports that she does not drink alcohol or use drugs.  No Known Allergies  Family History  Problem Relation Age of Onset  . Hypertension Mother        [place]  . Arthritis Mother   . Anemia Mother   . Cancer Mother  uterine?  . Diabetes Father   . Stroke Father 58  . Heart disease Father        CHF, has a defib  . Heart disease Brother        CHF  . Cancer Other        MGM "blood cancer"  . Colon cancer  Neg Hx   . Breast cancer Neg Hx     Prior to Admission medications   Medication Sig Start Date End Date Taking? Authorizing Provider  azelastine (ASTELIN) 137 MCG/SPRAY nasal spray Place 2 sprays into both nostrils at bedtime as needed for rhinitis. 03/05/13   Colon Branch, MD  azithromycin (ZITHROMAX) 250 MG tablet Take 2 tablets today then take 1 tablet per day for the next 4 days. 03/31/17   Petrucelli, Samantha R, PA-C  BIOTIN PO Take 1 tablet by mouth daily.    [provider]  budesonide-formoterol (SYMBICORT) 160-4.5 MCG/ACT inhaler Inhale 2 puffs into the lungs 2 (two) times daily. Patient taking differently: Inhale 2 puffs into the lungs as needed.  09/07/14   Colon Branch, MD  lisinopril-hydrochlorothiazide (PRINZIDE,ZESTORETIC) 20-25 MG tablet Take 1 tablet by mouth daily. 02/24/16   Melony Overly, MD  metoprolol (LOPRESSOR) 50 MG tablet Take 1 tablet (50 mg total) by mouth 2 (two) times daily. 02/24/16   Melony Overly, MD  naproxen (NAPROSYN) 500 MG tablet Take 1 tablet (500 mg total) by mouth 2 (two) times daily with a meal. 10/23/13   Margarita Mail, PA-C    Physical Exam: Vitals:   04/29/17 1600 04/29/17 1615 04/29/17 1630 04/29/17 1645  BP: 115/82 126/90 119/86 127/87  Pulse: 90     Resp: (!) 30 (!) 30 (!) 32 (!) 33  Temp:      TempSrc:      SpO2: 97%       Constitutional: NAD, calm, comfortable Vitals:   04/29/17 1600 04/29/17 1615 04/29/17 1630 04/29/17 1645  BP: 115/82 126/90 119/86 127/87  Pulse: 90     Resp: (!) 30 (!) 30 (!) 32 (!) 33  Temp:      TempSrc:      SpO2: 97%      Eyes: PERRL, lids and conjunctivae normal ENMT: Mucous membranes are moist. Posterior pharynx clear of any exudate or lesions.Normal dentition.  Neck: normal, supple, no masses, no thyromegaly Respiratory: increased respiratory effort, no audible wheezing, decreased BS throughout Cardiovascular: Regular rate and rhythm  Abdomen: no tenderness, no masses palpated. No  hepatosplenomegaly. Bowel sounds positive.  Musculoskeletal: no clubbing / cyanosis. No joint deformity upper and lower extremities. Good ROM, no contractures. Normal muscle tone.  Skin: no rashes, lesions, ulcers. No induration Neurologic: CN 2-12 grossly intact. Sensation intact, DTR normal. Strength 5/5 in all 4.  Psychiatric: Normal judgment and insight. Alert and oriented x 3. Normal mood.    Labs on Admission: I have personally reviewed following labs and imaging studies  CBC: Recent Labs  Lab 04/29/17 1127  WBC 15.9*  HGB 11.5*  HCT 36.3  MCV 87.5  PLT 016   Basic Metabolic Panel: Recent Labs  Lab 04/29/17 1127  NA 133*  K 3.8  CL 101  CO2 23  GLUCOSE 122*  BUN 18  CREATININE 1.72*  CALCIUM 9.1   GFR: CrCl cannot be calculated (Unknown ideal weight.). Liver Function Tests: No results for input(s): AST, ALT, ALKPHOS, BILITOT, PROT, ALBUMIN in the last 168 hours. No results for input(s): LIPASE, AMYLASE in the last 168  hours. No results for input(s): AMMONIA in the last 168 hours. Coagulation Profile: No results for input(s): INR, PROTIME in the last 168 hours. Cardiac Enzymes: No results for input(s): CKTOTAL, CKMB, CKMBINDEX, TROPONINI in the last 168 hours. BNP (last 3 results) No results for input(s): PROBNP in the last 8760 hours. HbA1C: No results for input(s): HGBA1C in the last 72 hours. CBG: No results for input(s): GLUCAP in the last 168 hours. Lipid Profile: No results for input(s): CHOL, HDL, LDLCALC, TRIG, CHOLHDL, LDLDIRECT in the last 72 hours. Thyroid Function Tests: No results for input(s): TSH, T4TOTAL, FREET4, T3FREE, THYROIDAB in the last 72 hours. Anemia Panel: No results for input(s): VITAMINB12, FOLATE, FERRITIN, TIBC, IRON, RETICCTPCT in the last 72 hours. Urine analysis:    Component Value Date/Time   COLORURINE RED (A) 11/22/2011 0957   APPEARANCEUR TURBID (A) 11/22/2011 0957   LABSPEC 1.017 11/22/2011 0957   PHURINE 5.0  11/22/2011 0957   GLUCOSEU NEGATIVE 11/22/2011 0957   GLUCOSEU NEG mg/dL 07/12/2006 2041   HGBUR LARGE (A) 11/22/2011 0957   BILIRUBINUR neg 08/29/2013 1321   KETONESUR 40 (A) 11/22/2011 0957   PROTEINUR neg 08/29/2013 1321   PROTEINUR 100 (A) 11/22/2011 0957   UROBILINOGEN 0.2 08/29/2013 1321   UROBILINOGEN 1.0 11/22/2011 0957   NITRITE neg 08/29/2013 1321   NITRITE POSITIVE (A) 11/22/2011 0957   LEUKOCYTESUR small (1+) 08/29/2013 1321   Sepsis Labs: !!!!!!!!!!!!!!!!!!!!!!!!!!!!!!!!!!!!!!!!!!!! @LABRCNTIP (procalcitonin:4,lacticidven:4) )No results found for this or any previous visit (from the past 240 hour(s)).   Radiological Exams on Admission: Dg Chest 2 View  Result Date: 04/29/2017 CLINICAL DATA:  Chest pain when she takes a deep breath. Has been ongoing since December. EXAM: CHEST  2 VIEW COMPARISON:  03/31/2017, 01/04/2014 FINDINGS: There is bilateral chronic interstitial lung disease. There is hazy left lower lobe airspace disease concerning for pneumonia. There is no pleural effusion or pneumothorax. The heart and mediastinal contours are unremarkable. The osseous structures are unremarkable. IMPRESSION: 1. Hazy left lower lobe airspace disease concerning for pneumonia. Electronically Signed   By: Kathreen Devoid   On: 04/29/2017 12:18   Ct Chest Wo Contrast  Result Date: 04/29/2017 CLINICAL DATA:  Cough with persistent shortness of breath. EXAM: CT CHEST WITHOUT CONTRAST TECHNIQUE: Multidetector CT imaging of the chest was performed following the standard protocol without IV contrast. COMPARISON:  None. FINDINGS: Cardiovascular: Heart is enlarged. Trace pericardial effusion noted. Coronary artery calcification is evident. Mediastinum/Nodes: No mediastinal lymphadenopathy. No evidence for gross hilar lymphadenopathy although assessment is limited by the lack of intravenous contrast on today's study. The esophagus has normal imaging features. Small subpectoral and axillary lymph nodes  are seen bilaterally. Lungs/Pleura: Areas of atelectasis and ill-defined ground-glass attenuation are noted bilaterally with scattered areas of focal airspace consolidation. Areas in both suggests a "reverse halo sign" . Scattered tiny ill-defined pulmonary nodules are evident bilaterally. Tiny left pleural effusion noted. Upper Abdomen: Liver is prominent but incompletely visualized. Irregular contour right upper pole kidney may be scarring although mass lesion not excluded. Musculoskeletal: Bone windows reveal no worrisome lytic or sclerotic osseous lesions. IMPRESSION: 1. Bilateral airspace opacities, some of which have a a more dense rim and central ground-glass attenuation. Organizing pneumonia of infectious/inflammatory etiology would be a consideration. 2. Scattered small bilateral pulmonary nodules, potentially postinfectious/postinflammatory. Metastatic disease cannot be excluded. Follow-up is recommended to ensure resolution/stability. Repeat CT chest in 3 months recommended. 3. Irregular contour upper pole right kidney. This may be related to cortical scarring but renal mass is  a concern. Follow-up CT or MRI of the abdomen recommended to further evaluate. Electronically Signed   By: Misty Stanley M.D.   On: 04/29/2017 16:14    EKG: Independently reviewed. NSR, QTc 464  Assessment/Plan Principal Problem:   Pneumonia of both lungs due to infectious organism Active Problems:   Essential hypertension, benign   GERD (gastroesophageal reflux disease)   Bronchospasm   Anxiety and depression   Tachypnea   1. Bilateral Pneumonia with sepsis present on admission 1. CXR and CT chest reviewed. Findings suggestive of B PNA 2. Presenting leukocytosis of 15.9k with tachypnea. Afebrile 3. Will check lactate 4. Failed outpatient treatment with levaquin and zpak 5. Patient started on azithromycin and rocephin in ED for CAP. Will continue for now 6. Flu and respiratory viral panel ordered,  pending 7. Will continue PRN duoneb 8. Continue to wean O2 as tolerated 2. Pulm nodules 1. Incidentally noted on CT chest, reviewed 2. Possible secondary to infection 3. Radiology recommendation for repeat CT chest in 3 months 3. HTN 1. BP stable at this time 2. Cont metoprolol 3. Hold lisinopril/htcz given Cr of 1.72 (was 1.5 on 02/24/16) 4. Tachypnea 1. Likely secondary to above PNA 2. Cont supplemental O2 as needed 5. GERD 1. Appears to be stable at this time 2. Continue on PPI as tolerated 6. Anxiety 1. Appears stable currently  DVT prophylaxis: Lovenox subQ  Code Status: Full Family Communication: Pt in room  Disposition Plan: Uncertain at this time  Consults called:  Admission status: Inpatient, as would likely require greater than 2 midnight stay for IV antibiotic for pna after failing outpatient treatment per above  Marylu Lund MD [facility] Pager [phone]  If 7PM-7AM, please contact night-coverage [email or web address] Password [email or web address]  04/29/2017, 5:05 PM

## 2017-04-29 NOTE — ED Notes (Signed)
Antibiotics administered beginning at 1800, unable to collect blood cultures prior to antibiotics.  Admitting MD aware.

## 2017-04-29 NOTE — ED Notes (Signed)
Pt refused rectal temp This RN explained to pt a rectal temp is the most accurate measure of temperature.

## 2017-04-30 DIAGNOSIS — J189 Pneumonia, unspecified organism: Principal | ICD-10-CM

## 2017-04-30 LAB — CBC
HEMATOCRIT: 31.9 % — AB (ref 36.0–46.0)
Hemoglobin: 10.4 g/dL — ABNORMAL LOW (ref 12.0–15.0)
MCH: 28.7 pg (ref 26.0–34.0)
MCHC: 32.6 g/dL (ref 30.0–36.0)
MCV: 87.9 fL (ref 78.0–100.0)
Platelets: 261 10*3/uL (ref 150–400)
RBC: 3.63 MIL/uL — ABNORMAL LOW (ref 3.87–5.11)
RDW: 14.3 % (ref 11.5–15.5)
WBC: 13.7 10*3/uL — ABNORMAL HIGH (ref 4.0–10.5)

## 2017-04-30 LAB — CBC WITH DIFFERENTIAL/PLATELET
Basophils Absolute: 0 10*3/uL (ref 0.0–0.1)
Basophils Relative: 0 %
EOS ABS: 0.2 10*3/uL (ref 0.0–0.7)
Eosinophils Relative: 2 %
HCT: 35.3 % — ABNORMAL LOW (ref 36.0–46.0)
Hemoglobin: 10.9 g/dL — ABNORMAL LOW (ref 12.0–15.0)
LYMPHS ABS: 2.6 10*3/uL (ref 0.7–4.0)
LYMPHS PCT: 21 %
MCH: 27.4 pg (ref 26.0–34.0)
MCHC: 30.9 g/dL (ref 30.0–36.0)
MCV: 88.7 fL (ref 78.0–100.0)
Monocytes Absolute: 1 10*3/uL (ref 0.1–1.0)
Monocytes Relative: 8 %
NEUTROS PCT: 69 %
Neutro Abs: 8.7 10*3/uL — ABNORMAL HIGH (ref 1.7–7.7)
Platelets: 258 10*3/uL (ref 150–400)
RBC: 3.98 MIL/uL (ref 3.87–5.11)
RDW: 14.1 % (ref 11.5–15.5)
WBC: 12.6 10*3/uL — AB (ref 4.0–10.5)

## 2017-04-30 LAB — PROTIME-INR
INR: 1.13
PROTHROMBIN TIME: 14.4 s (ref 11.4–15.2)

## 2017-04-30 LAB — EXPECTORATED SPUTUM ASSESSMENT W GRAM STAIN, RFLX TO RESP C

## 2017-04-30 LAB — PROCALCITONIN: Procalcitonin: 0.26 ng/mL

## 2017-04-30 LAB — COMPREHENSIVE METABOLIC PANEL
ALBUMIN: 2.8 g/dL — AB (ref 3.5–5.0)
ALT: 17 U/L (ref 14–54)
AST: 18 U/L (ref 15–41)
Alkaline Phosphatase: 205 U/L — ABNORMAL HIGH (ref 38–126)
Anion gap: 9 (ref 5–15)
BILIRUBIN TOTAL: 1.1 mg/dL (ref 0.3–1.2)
BUN: 16 mg/dL (ref 6–20)
CHLORIDE: 106 mmol/L (ref 101–111)
CO2: 18 mmol/L — ABNORMAL LOW (ref 22–32)
Calcium: 8.3 mg/dL — ABNORMAL LOW (ref 8.9–10.3)
Creatinine, Ser: 1.52 mg/dL — ABNORMAL HIGH (ref 0.44–1.00)
GFR calc Af Amer: 45 mL/min — ABNORMAL LOW (ref >60)
GFR calc non Af Amer: 39 mL/min — ABNORMAL LOW (ref >60)
GLUCOSE: 119 mg/dL — AB (ref 65–99)
POTASSIUM: 3.9 mmol/L (ref 3.5–5.1)
Sodium: 133 mmol/L — ABNORMAL LOW (ref 135–145)
Total Protein: 6.6 g/dL (ref 6.5–8.1)

## 2017-04-30 LAB — EXPECTORATED SPUTUM ASSESSMENT W REFEX TO RESP CULTURE

## 2017-04-30 LAB — STREP PNEUMONIAE URINARY ANTIGEN: Strep Pneumo Urinary Antigen: NEGATIVE

## 2017-04-30 LAB — C-REACTIVE PROTEIN: CRP: 20.2 mg/dL — ABNORMAL HIGH (ref <1.0)

## 2017-04-30 LAB — HIV ANTIBODY (ROUTINE TESTING W REFLEX): HIV SCREEN 4TH GENERATION: NONREACTIVE

## 2017-04-30 LAB — LACTIC ACID, PLASMA: Lactic Acid, Venous: 0.8 mmol/L (ref 0.5–1.9)

## 2017-04-30 MED ORDER — ALBUTEROL SULFATE (2.5 MG/3ML) 0.083% IN NEBU
2.5000 mg | INHALATION_SOLUTION | Freq: Four times a day (QID) | RESPIRATORY_TRACT | Status: DC
  Administered 2017-04-30 (×2): 2.5 mg via RESPIRATORY_TRACT
  Filled 2017-04-30 (×2): qty 3

## 2017-04-30 MED ORDER — IPRATROPIUM-ALBUTEROL 0.5-2.5 (3) MG/3ML IN SOLN
3.0000 mL | Freq: Four times a day (QID) | RESPIRATORY_TRACT | Status: DC
  Administered 2017-04-30 – 2017-05-02 (×7): 3 mL via RESPIRATORY_TRACT
  Filled 2017-04-30 (×8): qty 3

## 2017-04-30 MED ORDER — IPRATROPIUM BROMIDE 0.02 % IN SOLN
0.5000 mg | Freq: Four times a day (QID) | RESPIRATORY_TRACT | Status: DC
  Administered 2017-04-30 (×2): 0.5 mg via RESPIRATORY_TRACT
  Filled 2017-04-30 (×2): qty 2.5

## 2017-04-30 NOTE — ED Notes (Signed)
Recollected lactic acid plasma and procalcitonin.

## 2017-04-30 NOTE — Consult Note (Signed)
PULMONARY / CRITICAL CARE MEDICINE   Name: Kristin Burnett MRN: [id number] DOB: [birth year]    ADMISSION DATE:  04/29/2017 CONSULTATION DATE:  04/30/17  REFERRING MD:  Niel Hummer, MD  CHIEF COMPLAINT:  Recurrent pneumonia  HISTORY OF PRESENT ILLNESS:   52 year old with history of dysfunctional uterine bleeding, GERD, hypertension, active smoker presenting with recurrent pneumonia.  She was treated on 11/5 in the ED with Levaquin which improved her symptoms but recurred.  She was seen again on 12/8 when she got a Z-Pak.  She never got steroids.  She returns with cough, white mucus production for the past week with chills, weakness, decreased appetite.  She is an active smoker with 15-30-pack-year smoking history.  She was given Symbicort and albuterol by her outpatient physician but he uses this intermittently.  She does not have any significant exposures recently.  She reports mold in her apartment 2 years ago.  No joint pain, rash or morning stiffness.  Pets: No pets at home, no birds or exposure to farm animals Occupation: Works in [employer] for a [employer]. Exposures: Exposed to mold 2 years ago.  No recent exposure.  No hot tubs, Jacuzzi Smoking history: 1/2 pack per day smoker.  15-30-pack-year smoking history Travel History: Not significant  PAST MEDICAL HISTORY :  She  has a past medical history of Bronchospasm (01/09/2013), DUB (dysfunctional uterine bleeding), GERD (gastroesophageal reflux disease), History of blood transfusion (2011), Hypertension, Recurrent UTI, h/o urethra diverticuli (10/13/2009), Urethral diverticulum, and Yeast vaginitis.  PAST SURGICAL HISTORY: She  has a past surgical history that includes Tubal ligation; Laser ablation; Robotic assisted total hysterectomy (03/07/2012); Bilateral salpingectomy (03/07/2012); and Cystoscopy (03/07/2012).  No Known Allergies  No current facility-administered medications on file prior to encounter.    Current Outpatient  Medications on File Prior to Encounter  Medication Sig  . albuterol (PROVENTIL HFA) 108 (90 Base) MCG/ACT inhaler Inhale 2 puffs into the lungs every 6 (six) hours as needed for wheezing or shortness of breath.  . budesonide-formoterol (SYMBICORT) 160-4.5 MCG/ACT inhaler Inhale 2 puffs into the lungs 2 (two) times daily. (Patient taking differently: Inhale 2 puffs into the lungs 2 (two) times daily as needed ("for respiratory flares"). )  . lisinopril-hydrochlorothiazide (PRINZIDE,ZESTORETIC) 20-25 MG tablet Take 1 tablet by mouth daily.  . metoprolol (LOPRESSOR) 50 MG tablet Take 1 tablet (50 mg total) by mouth 2 (two) times daily.  . Simethicone (GAS-X PO) Take 1 tablet by mouth every 4 (four) hours as needed (for gas).  . naproxen (NAPROSYN) 500 MG tablet Take 1 tablet (500 mg total) by mouth 2 (two) times daily with a meal. (Patient not taking: Reported on 04/29/2017)    FAMILY HISTORY:  Her indicated that her mother is alive. She indicated that her father is alive. She indicated that all of her three brothers are alive. She indicated that the status of her neg hx is unknown. She indicated that the status of her other is unknown.   SOCIAL HISTORY: She  reports that she has been smoking cigarettes.  She started smoking about 30 years ago. She has a 26.00 pack-year smoking history. she has never used smokeless tobacco. She reports that she does not drink alcohol or use drugs.  REVIEW OF SYSTEMS:   Constitutional: Positive for fever and chills.  HENT: Negative.   Eyes: Negative for blurred vision.  Respiratory: as per HPI  Cardiovascular: Negative for chest pain and palpitations.  Gastrointestinal: Negative for vomiting, diarrhea, blood per rectum. Genitourinary: Negative for  dysuria, urgency, frequency and hematuria.  Musculoskeletal: Positive for myalgias..  Skin: Negative for itching and rash.  Neurological: Negative for dizziness, tremors, focal weakness, seizures and loss of  consciousness.  Endo/Heme/Allergies: Negative for environmental allergies.  Psychiatric/Behavioral: Negative for depression, suicidal ideas and hallucinations.  All other systems reviewed and are negative.   SUBJECTIVE:   VITAL SIGNS: BP 112/89   Pulse 99   Temp 99.3 F (37.4 C) (Oral)   Resp (!) 24   LMP 02/29/2012 (Exact Date)   SpO2 100%   HEMODYNAMICS:    VENTILATOR SETTINGS:    INTAKE / OUTPUT: I/O last 3 completed shifts: In: 1750 [IV Piggyback:1750] Out: -   PHYSICAL EXAMINATION: Gen:      No acute distress HEENT:  EOMI, sclera anicteric Neck:     No masses; no thyromegaly Lungs:    Scattered b/l crackles R > L; normal respiratory effort CV:         Regular rate and rhythm; no murmurs Abd:      + bowel sounds; soft, non-tender; no palpable masses, no distension Ext:    No edema; adequate peripheral perfusion Skin:      Warm and dry; no rash Neuro: alert and oriented x 3 Psych: normal mood and affect  LABS:  BMET Recent Labs  Lab 04/29/17 1127 04/29/17 1724 04/30/17 0145  NA 133*  --  133*  K 3.8  --  3.9  CL 101  --  106  CO2 23  --  18*  BUN 18  --  16  CREATININE 1.72* 1.59* 1.52*  GLUCOSE 122*  --  119*    Electrolytes Recent Labs  Lab 04/29/17 1127 04/30/17 0145  CALCIUM 9.1 8.3*    CBC Recent Labs  Lab 04/29/17 1127 04/29/17 1724 04/30/17 0145  WBC 15.9* 14.9* 13.7*  HGB 11.5* 10.5* 10.4*  HCT 36.3 33.8* 31.9*  PLT 291 274 261    Coag's No results for input(s): APTT, INR in the last 168 hours.  Sepsis Markers Recent Labs  Lab 04/30/17 0036  LATICACIDVEN 0.8    ABG No results for input(s): PHART, PCO2ART, PO2ART in the last 168 hours.  Liver Enzymes Recent Labs  Lab 04/30/17 0145  AST 18  ALT 17  ALKPHOS 205*  BILITOT 1.1  ALBUMIN 2.8*    Cardiac Enzymes No results for input(s): TROPONINI, PROBNP in the last 168 hours.  Glucose No results for input(s): GLUCAP in the last 168 hours.  Imaging Chest  x-ray 03/08/17-patchy bilateral infiltrates Chest x-ray 03/31/17- persistent patchy bilateral infiltrates Chest x-ray 04/29/17- bilateral interstitial opacities.  CT chest 04/29/17-bilateral airspace opacities, scattered bilateral pulmonary nodules.  Irregular contour in the right kidney. I have reviewed the images personally.  STUDIES:   CULTURES: Blood cultures 04/29/17- Respiratory virus panel, flu PCR 04/29/17-negative  ANTIBIOTICS: Ceftriaxone 04/30/17> Azithromycin 04/30/17>  SIGNIFICANT EVENTS:   LINES/TUBES:    ASSESSMENT / PLAN: 52 year old with recurrent pneumonia and bilateral infiltrate. This is her third episode of the past 2 months after appropriate antibiotic therapy.  R/O non-infectious causes including eosinophilic pneumonia, organizing pneumonia. There is no clinical evidence of recurrent aspiration, connective tissue disease.   - Plan for bronchoscope with BAL and biopsy tomorrow - Keep NPO tonight - Check HIV, quantitative immunoglobulins, ANA, RF, CCP, CBC with diff - Continue current antibiotic therapy - Nebs PRN - Follow cultures. Check Pro calcitonin. Check Urine legionella and pneumococcus  Risk, benefit of procedure discussed with patient and she is agreeable.  Lulu Hirschmann  MD  Pulmonary and Critical Care Pager [phone] If no answer or after 3pm call: [phone] 04/30/2017, 11:24 AM

## 2017-04-30 NOTE — ED Notes (Signed)
Heart Healthy Diet ordered for Breakfast. 

## 2017-04-30 NOTE — ED Notes (Signed)
Heart Healthy Diet was ordered for lunch. 

## 2017-04-30 NOTE — ED Notes (Signed)
Due to O2 sat of 87-90% pt placed on 2L O2 by [place]

## 2017-04-30 NOTE — Progress Notes (Signed)
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

## 2017-04-30 NOTE — ED Notes (Signed)
Regular breakfast tray ordered @ 0706

## 2017-04-30 NOTE — ED Notes (Signed)
Cbc sent to main lab.

## 2017-04-30 NOTE — Progress Notes (Signed)
PROGRESS NOTE    Kristin Burnett  YFV:[id number] DOB: 03/11/1966 DOA: 04/29/2017 PCP: Patient, No Pcp Per   Brief Narrative: Kristin Burnett is a 52 y.o. female with medical history significant of bronchospasm, HTN, recently treated CAP who presented to the ED with worsening cough, malaise, muscle soreness. Patient was initially seen on 11/15 in urgent care for cough, found to have bronchopneumonia and prescribed levaquin and albuterol. Patient seen in ED for follow up on 12/5 with continued symptoms. Patient was discharged from ED with Zpak. Patient completed course with some improvement, however after finishing course, symptoms again worsened. Patient reports chest tightness with myalgias involving shoulders, chills, weakness, decreased appetite.   ED Course: In the ED, patient was noted to have increased tachypnea with RR in the 30's, CXR with findings suggestive of LLL airspace disease. CT chest with findings worrisome for bilateral airspace disease concerning for organizing PNA of infectious/inflammatory etiology. Also noted were scattered small B pulmonary nodules, potentially infectious/inflammatory although metastatic disease cannot be excluded. Patient was started on empiric azithromycin and rocephin. Flu and respiratory viral panel ordered. Hospitalist consulted for consideration for admission.    Assessment & Plan:   Principal Problem:   Pneumonia of both lungs due to infectious organism Active Problems:   Essential hypertension, benign   GERD (gastroesophageal reflux disease)   Bronchospasm   Anxiety and depression   Tachypnea   Pneumonia  1-? PNA;  Presents with cough, dyspnea, pleuritic chest pain.  CT chest ; Bilateral airspace opacities , organizing PNA infectious/inflammatory.  She has been treated twice over last 2 months with antibiotics. Will ask pulmonary to see. Does patient needs bronchoscopy to rule out infectious, inflammation, malignancy,.   WBC elevated on  admission, now trending down. I will continue with IV antibiotics.  Will check ESR, ANA, CRP, Double strand DNA.  Will order nebulizer treatment,   2-Acute Hypoxic Respiratory Failure;  In setting of possible PNA.  Check ECHO./   3-Hyponatremia; continue with IV fluids.   4-AKI; metabolic acidosis;  Continue with IV fluids.  Improving.   5-Pulmonary nodule; needs follow up CY chest patient is aware.   6-Irregular contour of kidney.  Needs follow up MRI when able to tolerate test. Patient is aware.   Chest pain;  Will check ECHO.  Report orthopnea.   HTN; hold diuretics. Continue with metoprolol.   Transaminases; repeat in am.       DVT prophylaxis:Lovenox Code Status: full code.  Family Communication:  Disposition Plan: remain inpatient.    Consultants:   none   Procedures: none   Antimicrobials:   Ceftriaxone 1-06  Azithromycin 1-06   Subjective: She is feeling a little better.  Report cough at times productive.  Report dyspnea improved but not baseline.    Objective: Vitals:   04/30/17 0100 04/30/17 0300 04/30/17 0400 04/30/17 0500  BP: 118/82 129/76 (!) 109/59 120/80  Pulse: 93 95 95 92  Resp: (!) 24 (!) 25 (!) 22 20  Temp:      TempSrc:      SpO2: 93% 93% 96% 97%    Intake/Output Summary (Last 24 hours) at 04/30/2017 0737 Last data filed at 04/29/2017 1849 Gross per 24 hour  Intake 1750 ml  Output -  Net 1750 ml   There were no vitals filed for this visit.  Examination:  General exam: Appears calm and comfortable  Respiratory system:  Respiratory effort normal. Decrease breath sounds, bilateral ronchus.  Cardiovascular system: S1 & S2 heard, RRR.  No JVD, murmurs, rubs, gallops or clicks. No pedal edema. Gastrointestinal system: Abdomen is nondistended, soft and nontender. No organomegaly or masses felt. Normal bowel sounds heard. Central nervous system: Alert and oriented. No focal neurological deficits. Extremities: Symmetric 5 x 5  power. Skin: No rashes, lesions or ulcers   Data Reviewed: I have personally reviewed following labs and imaging studies  CBC: Recent Labs  Lab 04/29/17 1127 04/29/17 1724 04/30/17 0145  WBC 15.9* 14.9* 13.7*  HGB 11.5* 10.5* 10.4*  HCT 36.3 33.8* 31.9*  MCV 87.5 87.6 87.9  PLT 291 274 179   Basic Metabolic Panel: Recent Labs  Lab 04/29/17 1127 04/29/17 1724 04/30/17 0145  NA 133*  --  133*  K 3.8  --  3.9  CL 101  --  106  CO2 23  --  18*  GLUCOSE 122*  --  119*  BUN 18  --  16  CREATININE 1.72* 1.59* 1.52*  CALCIUM 9.1  --  8.3*   GFR: CrCl cannot be calculated (Unknown ideal weight.). Liver Function Tests: Recent Labs  Lab 04/30/17 0145  AST 18  ALT 17  ALKPHOS 205*  BILITOT 1.1  PROT 6.6  ALBUMIN 2.8*   No results for input(s): LIPASE, AMYLASE in the last 168 hours. No results for input(s): AMMONIA in the last 168 hours. Coagulation Profile: No results for input(s): INR, PROTIME in the last 168 hours. Cardiac Enzymes: No results for input(s): CKTOTAL, CKMB, CKMBINDEX, TROPONINI in the last 168 hours. BNP (last 3 results) No results for input(s): PROBNP in the last 8760 hours. HbA1C: No results for input(s): HGBA1C in the last 72 hours. CBG: No results for input(s): GLUCAP in the last 168 hours. Lipid Profile: No results for input(s): CHOL, HDL, LDLCALC, TRIG, CHOLHDL, LDLDIRECT in the last 72 hours. Thyroid Function Tests: No results for input(s): TSH, T4TOTAL, FREET4, T3FREE, THYROIDAB in the last 72 hours. Anemia Panel: No results for input(s): VITAMINB12, FOLATE, FERRITIN, TIBC, IRON, RETICCTPCT in the last 72 hours. Sepsis Labs: Recent Labs  Lab 04/30/17 0036  LATICACIDVEN 0.8    Recent Results (from the past 240 hour(s))  Respiratory Panel by PCR     Status: None   Collection Time: 04/29/17  5:43 PM  Result Value Ref Range Status   Adenovirus NOT DETECTED NOT DETECTED Final   Coronavirus 229E NOT DETECTED NOT DETECTED Final    Coronavirus HKU1 NOT DETECTED NOT DETECTED Final   Coronavirus NL63 NOT DETECTED NOT DETECTED Final   Coronavirus OC43 NOT DETECTED NOT DETECTED Final   Metapneumovirus NOT DETECTED NOT DETECTED Final   Rhinovirus / Enterovirus NOT DETECTED NOT DETECTED Final   Influenza A NOT DETECTED NOT DETECTED Final   Influenza B NOT DETECTED NOT DETECTED Final   Parainfluenza Virus 1 NOT DETECTED NOT DETECTED Final   Parainfluenza Virus 2 NOT DETECTED NOT DETECTED Final   Parainfluenza Virus 3 NOT DETECTED NOT DETECTED Final   Parainfluenza Virus 4 NOT DETECTED NOT DETECTED Final   Respiratory Syncytial Virus NOT DETECTED NOT DETECTED Final   Bordetella pertussis NOT DETECTED NOT DETECTED Final   Chlamydophila pneumoniae NOT DETECTED NOT DETECTED Final   Mycoplasma pneumoniae NOT DETECTED NOT DETECTED Final         Radiology Studies: Dg Chest 2 View  Result Date: 04/29/2017 CLINICAL DATA:  Chest pain when she takes a deep breath. Has been ongoing since December. EXAM: CHEST  2 VIEW COMPARISON:  03/31/2017, 01/04/2014 FINDINGS: There is bilateral chronic interstitial lung disease. There  is hazy left lower lobe airspace disease concerning for pneumonia. There is no pleural effusion or pneumothorax. The heart and mediastinal contours are unremarkable. The osseous structures are unremarkable. IMPRESSION: 1. Hazy left lower lobe airspace disease concerning for pneumonia. Electronically Signed   By: Kathreen Devoid   On: 04/29/2017 12:18   Ct Chest Wo Contrast  Result Date: 04/29/2017 CLINICAL DATA:  Cough with persistent shortness of breath. EXAM: CT CHEST WITHOUT CONTRAST TECHNIQUE: Multidetector CT imaging of the chest was performed following the standard protocol without IV contrast. COMPARISON:  None. FINDINGS: Cardiovascular: Heart is enlarged. Trace pericardial effusion noted. Coronary artery calcification is evident. Mediastinum/Nodes: No mediastinal lymphadenopathy. No evidence for gross hilar  lymphadenopathy although assessment is limited by the lack of intravenous contrast on today's study. The esophagus has normal imaging features. Small subpectoral and axillary lymph nodes are seen bilaterally. Lungs/Pleura: Areas of atelectasis and ill-defined ground-glass attenuation are noted bilaterally with scattered areas of focal airspace consolidation. Areas in both suggests a "reverse halo sign" . Scattered tiny ill-defined pulmonary nodules are evident bilaterally. Tiny left pleural effusion noted. Upper Abdomen: Liver is prominent but incompletely visualized. Irregular contour right upper pole kidney may be scarring although mass lesion not excluded. Musculoskeletal: Bone windows reveal no worrisome lytic or sclerotic osseous lesions. IMPRESSION: 1. Bilateral airspace opacities, some of which have a a more dense rim and central ground-glass attenuation. Organizing pneumonia of infectious/inflammatory etiology would be a consideration. 2. Scattered small bilateral pulmonary nodules, potentially postinfectious/postinflammatory. Metastatic disease cannot be excluded. Follow-up is recommended to ensure resolution/stability. Repeat CT chest in 3 months recommended. 3. Irregular contour upper pole right kidney. This may be related to cortical scarring but renal mass is a concern. Follow-up CT or MRI of the abdomen recommended to further evaluate. Electronically Signed   By: Misty Stanley M.D.   On: 04/29/2017 16:14        Scheduled Meds: . enoxaparin (LOVENOX) injection  40 mg Subcutaneous Q24H  . metoprolol tartrate  50 mg Oral BID  . pantoprazole  40 mg Oral Daily  . polyethylene glycol  17 g Oral Daily   Continuous Infusions: . sodium chloride 75 mL/hr at 04/29/17 1849  . azithromycin    . cefTRIAXone (ROCEPHIN)  IV       LOS: 1 day    Time spent: 35 minutes.     Elmarie Shiley, MD [facility] Pager [phone]  If 7PM-7AM, please contact  night-coverage [email or web address] Password [email or web address] 04/30/2017, 7:37 AM

## 2017-05-01 ENCOUNTER — Inpatient Hospital Stay (HOSPITAL_COMMUNITY): Payer: Self-pay

## 2017-05-01 ENCOUNTER — Encounter (HOSPITAL_COMMUNITY)
Admission: EM | Disposition: A | Discharge status: Home / Self Care | Payer: Self-pay | Source: Home / Self Care | Attending: Internal Medicine

## 2017-05-01 ENCOUNTER — Encounter (HOSPITAL_COMMUNITY): Payer: Self-pay | Admitting: General Practice

## 2017-05-01 ENCOUNTER — Other Ambulatory Visit: Payer: Self-pay

## 2017-05-01 HISTORY — PX: VIDEO BRONCHOSCOPY: SHX5072

## 2017-05-01 LAB — COMPREHENSIVE METABOLIC PANEL
ALK PHOS: 222 U/L — AB (ref 38–126)
ALT: 15 U/L (ref 14–54)
ANION GAP: 8 (ref 5–15)
AST: 19 U/L (ref 15–41)
Albumin: 2.5 g/dL — ABNORMAL LOW (ref 3.5–5.0)
BILIRUBIN TOTAL: 0.9 mg/dL (ref 0.3–1.2)
BUN: 14 mg/dL (ref 6–20)
CALCIUM: 8.3 mg/dL — AB (ref 8.9–10.3)
CO2: 19 mmol/L — ABNORMAL LOW (ref 22–32)
Chloride: 107 mmol/L (ref 101–111)
Creatinine, Ser: 1.62 mg/dL — ABNORMAL HIGH (ref 0.44–1.00)
GFR calc non Af Amer: 36 mL/min — ABNORMAL LOW (ref >60)
GFR, EST AFRICAN AMERICAN: 42 mL/min — AB (ref >60)
Glucose, Bld: 108 mg/dL — ABNORMAL HIGH (ref 65–99)
Potassium: 3.6 mmol/L (ref 3.5–5.1)
Sodium: 134 mmol/L — ABNORMAL LOW (ref 135–145)
TOTAL PROTEIN: 6.7 g/dL (ref 6.5–8.1)

## 2017-05-01 LAB — RHEUMATOID FACTOR: RHEUMATOID FACTOR: 18.9 [IU]/mL — AB (ref 0.0–13.9)

## 2017-05-01 LAB — CBC WITH DIFFERENTIAL/PLATELET
Basophils Absolute: 0 10*3/uL (ref 0.0–0.1)
Basophils Relative: 0 %
Eosinophils Absolute: 0.2 10*3/uL (ref 0.0–0.7)
Eosinophils Relative: 2 %
HEMATOCRIT: 32.4 % — AB (ref 36.0–46.0)
HEMOGLOBIN: 9.9 g/dL — AB (ref 12.0–15.0)
LYMPHS ABS: 2.3 10*3/uL (ref 0.7–4.0)
Lymphocytes Relative: 20 %
MCH: 26.8 pg (ref 26.0–34.0)
MCHC: 30.6 g/dL (ref 30.0–36.0)
MCV: 87.8 fL (ref 78.0–100.0)
MONOS PCT: 9 %
Monocytes Absolute: 1 10*3/uL (ref 0.1–1.0)
NEUTROS ABS: 7.9 10*3/uL — AB (ref 1.7–7.7)
NEUTROS PCT: 69 %
Platelets: 262 10*3/uL (ref 150–400)
RBC: 3.69 MIL/uL — ABNORMAL LOW (ref 3.87–5.11)
RDW: 14 % (ref 11.5–15.5)
WBC: 11.4 10*3/uL — ABNORMAL HIGH (ref 4.0–10.5)

## 2017-05-01 LAB — BODY FLUID CELL COUNT WITH DIFFERENTIAL
Eos, Fluid: 6 %
LYMPHS FL: 15 %
Monocyte-Macrophage-Serous Fluid: 43 % — ABNORMAL LOW (ref 50–90)
Neutrophil Count, Fluid: 36 % — ABNORMAL HIGH (ref 0–25)
WBC FLUID: 178 uL (ref 0–1000)

## 2017-05-01 LAB — LEGIONELLA PNEUMOPHILA SEROGP 1 UR AG: L. pneumophila Serogp 1 Ur Ag: NEGATIVE

## 2017-05-01 LAB — HIV ANTIBODY (ROUTINE TESTING W REFLEX): HIV Screen 4th Generation wRfx: NONREACTIVE

## 2017-05-01 LAB — ANTI-DNA ANTIBODY, DOUBLE-STRANDED: ds DNA Ab: 2 IU/mL (ref 0–9)

## 2017-05-01 LAB — CYCLIC CITRUL PEPTIDE ANTIBODY, IGG/IGA: CCP Antibodies IgG/IgA: 6 units (ref 0–19)

## 2017-05-01 LAB — ANA W/REFLEX IF POSITIVE: Anti Nuclear Antibody(ANA): POSITIVE — AB

## 2017-05-01 SURGERY — BRONCHOSCOPY, WITH FLUOROSCOPY
Anesthesia: Moderate Sedation | Laterality: Bilateral

## 2017-05-01 MED ORDER — METOPROLOL TARTRATE 50 MG PO TABS
50.0000 mg | ORAL_TABLET | Freq: Two times a day (BID) | ORAL | Status: DC
  Administered 2017-05-01 – 2017-05-03 (×5): 50 mg via ORAL
  Filled 2017-05-01 (×4): qty 1

## 2017-05-01 MED ORDER — FENTANYL CITRATE (PF) 100 MCG/2ML IJ SOLN
INTRAMUSCULAR | Status: DC | PRN
  Administered 2017-05-01 (×4): 25 ug via INTRAVENOUS

## 2017-05-01 MED ORDER — FENTANYL CITRATE (PF) 100 MCG/2ML IJ SOLN
INTRAMUSCULAR | Status: AC
  Filled 2017-05-01: qty 4

## 2017-05-01 MED ORDER — MIDAZOLAM HCL 10 MG/2ML IJ SOLN
INTRAMUSCULAR | Status: DC | PRN
  Administered 2017-05-01 (×4): 1 mg via INTRAVENOUS

## 2017-05-01 MED ORDER — LIDOCAINE HCL (PF) 1 % IJ SOLN
INTRAMUSCULAR | Status: DC | PRN
  Administered 2017-05-01: 6 mL

## 2017-05-01 MED ORDER — BUTAMBEN-TETRACAINE-BENZOCAINE 2-2-14 % EX AERO: 1.0000 | INHALATION_SPRAY | Freq: Once | CUTANEOUS | Status: DC

## 2017-05-01 MED ORDER — MIDAZOLAM HCL 5 MG/ML IJ SOLN
INTRAMUSCULAR | Status: AC
  Filled 2017-05-01: qty 2

## 2017-05-01 MED ORDER — LIDOCAINE HCL 2 % EX GEL
CUTANEOUS | Status: DC | PRN
  Administered 2017-05-01: 1

## 2017-05-01 MED ORDER — SODIUM CHLORIDE 0.9 % IV SOLN
INTRAVENOUS | Status: DC
  Administered 2017-05-01: 09:00:00 via INTRAVENOUS

## 2017-05-01 MED ORDER — PHENYLEPHRINE HCL 0.25 % NA SOLN: 1.0000 | Freq: Four times a day (QID) | NASAL | Status: DC | PRN

## 2017-05-01 MED ORDER — PHENYLEPHRINE HCL 0.25 % NA SOLN
NASAL | Status: DC | PRN
  Administered 2017-05-01: 2 via NASAL

## 2017-05-01 MED ORDER — LIDOCAINE HCL 2 % EX GEL: 1.0000 "application " | Freq: Once | CUTANEOUS | Status: DC

## 2017-05-01 NOTE — Op Note (Signed)
[facility] Cardiopulmonary Patient Name: Kristin Burnett Pocedure Date: 05/01/2017 MRN: [id number] Attending MD: Marshell Garfinkel , MD Date of Birth: Sep 21, 1965 CSN: Finalized Age: 52 Admit Type: Inpatient Gender: Female Procedure:            Bronchoscopy Indications:          Recurrent bilateral pneumonia Providers:            Marshell Garfinkel, MD, Cherre Huger RRT, RCP, Ashley Mariner                        RRT,RCP Referring MD:          Medicines:            Midazolam 4 mg IV, Fentanyl 100 mcg IV, Lidocaine 1%                        applied to cords 10 mL, Lidocaine 1% subglottic space                        10 mL Complications:        No immediate complications Estimated Blood Loss: Estimated blood loss: none. Estimated blood loss: none. Procedure:            Pre-Anesthesia Assessment:                       - A History and Physical has been performed. Patient                        meds and allergies have been reviewed. The risks and                        benefits of the procedure and the sedation options and                        risks were discussed with the patient. All questions                        were answered and informed consent was obtained.                        Patient identification and proposed procedure were                        verified prior to the procedure by the physician in the                        pre-procedure area. Mental Status Examination: alert                        and oriented. Airway Examination: normal oropharyngeal                        airway. Respiratory Examination: clear to auscultation.                        CV Examination: RRR, no murmurs, no S3 or S4. ASA Grade                        Assessment: I - A normal healthy patient. After  reviewing the risks and benefits, the patient was                        deemed in satisfactory condition to undergo the                        procedure. The anesthesia  plan was to use moderate                        sedation / analgesia (conscious sedation). Immediately                        prior to administration of medications, the patient was                        re-assessed for adequacy to receive sedatives. The                        heart rate, respiratory rate, oxygen saturations, blood                        pressure, adequacy of pulmonary ventilation, and                        response to care were monitored throughout the                        procedure. The physical status of the patient was                        re-assessed after the procedure.                       After obtaining informed consent, the bronchoscope was                        passed under direct vision. Throughout the procedure,                        the patient's blood pressure, pulse, and oxygen                        saturations were monitored continuously. the [id number]                        [id number] scope was introduced through the mouth and                        advanced to the tracheobronchial tree of both lungs. Scope In: 9:24:13 AM Scope Out: 9:41:08 AM Findings:      The oropharynx appears normal. The larynx appears normal. The vocal       cords appear normal. The subglottic space is normal. The trachea is of       normal caliber. The carina is sharp. The tracheobronchial tree was       examined to at least the first subsegmental level. Bronchial mucosa and       anatomy are normal; there are no endobronchial lesions, and no       secretions.      Bronchoalveolar lavage was performed in the RML medial segment (B5)  of       the lung and sent for cell count, bacterial culture, viral smears &       culture, and fungal & AFB analysis and cytology. 180 mL of fluid were       instilled. 90 mL were returned. The return was cloudy. There were no       mucoid plugs in the return fluid. Multiple specimens were obtained and       pooled into one specimen, which was  sent for analysis.      Fluoroscopically guided transbronchial brushings of an area of       infiltration were obtained in the medial segment of the right middle       lobe with a cytology brush and sent for routine cytology. Four samples       were obtained.      Transbronchial biopsies of an area of infiltration were performed in the       medial segment of the right middle lobe using alligator forceps and sent       for histopathology examination. The procedure was guided by fluoroscopy.       Transbronchial biopsy technique was selected because the sampling site       was not visible endoscopically. Five biopsy passes were performed. Five       biopsy samples were obtained. Impression:           - Recurrent bilateral pneumonia                       - The examination was normal.                       - Bronchoalveolar lavage was performed.                       - Fluoroscopically guided transbronchial brushings were                        obtained.                       - Transbronchial lung biopsies were performed. Moderate Sedation:      Moderate (conscious) sedation was administered by the endoscopy nurse       and supervised by the endoscopist. The following parameters were       monitored: oxygen saturation, heart rate, blood pressure, and response       to care. Total physician intraservice time was 40 minutes. Recommendation:       - Await BAL, biopsy and brushing results. Procedure Code(s):    --- Professional ---                       [phone], Bronchoscopy, rigid or flexible, including                        fluoroscopic guidance, when performed; with                        transbronchial lung biopsy(s), single lobe                       95188, Bronchoscopy, rigid or flexible, including  fluoroscopic guidance, when performed; with bronchial                        alveolar lavage                       [phone], Bronchoscopy, rigid or flexible, including                         fluoroscopic guidance, when performed; with brushing or                        protected brushings                       99152, Moderate sedation services provided by the same                        physician or other qualified health care professional                        performing the diagnostic or therapeutic service that                        the sedation supports, requiring the presence of an                        independent trained observer to assist in the                        monitoring of the patient's level of consciousness and                        physiological status; initial 15 minutes of                        intraservice time, patient age 63 years or older                       [phone], Moderate sedation services; each additional 15                        minutes intraservice time                       99153, Moderate sedation services; each additional 15                        minutes intraservice time Diagnosis Code(s):    --- Professional ---                       J18.9, Pneumonia, unspecified organism CPT copyright 2016 [employer]. All rights reserved. The codes documented in this report are preliminary and upon coder review may  be revised to meet current compliance requirements. Marshell Garfinkel, MD 05/01/2017 10:10:26 AM Number of Addenda: 0

## 2017-05-01 NOTE — Progress Notes (Signed)
Video bronchoscopy performed Intervention bronchial washings Intervention bronchial brushings Intervention bronchial biopsies Pt tolerated well  Milburn Freeney David RRT  

## 2017-05-01 NOTE — Plan of Care (Signed)
  Progressing Education: Knowledge of General Education information will improve 05/01/2017 0352 - Progressing by Verne Grain, RN Health Behavior/Discharge Planning: Ability to manage health-related needs will improve 05/01/2017 0352 - Progressing by Verne Grain, RN Clinical Measurements: Ability to maintain clinical measurements within normal limits will improve 05/01/2017 0352 - Progressing by Verne Grain, RN Will remain free from infection 05/01/2017 0352 - Progressing by Verne Grain, RN Diagnostic test results will improve 05/01/2017 0352 - Progressing by Verne Grain, RN Respiratory complications will improve 05/01/2017 0352 - Progressing by Verne Grain, RN Cardiovascular complication will be avoided 05/01/2017 0352 - Progressing by Verne Grain, RN Activity: Risk for activity intolerance will decrease 05/01/2017 0352 - Progressing by Verne Grain, RN Nutrition: Adequate nutrition will be maintained 05/01/2017 0352 - Progressing by Verne Grain, RN Coping: Level of anxiety will decrease 05/01/2017 0352 - Progressing by Verne Grain, RN Elimination: Will not experience complications related to bowel motility 05/01/2017 0352 - Progressing by Verne Grain, RN Will not experience complications related to urinary retention 05/01/2017 0352 - Progressing by Verne Grain, RN Pain Managment: General experience of comfort will improve 05/01/2017 0352 - Progressing by Verne Grain, RN Safety: Ability to remain free from injury will improve 05/01/2017 0352 - Progressing by Verne Grain, RN Skin Integrity: Risk for impaired skin integrity will decrease 05/01/2017 0352 - Progressing by Verne Grain, RN

## 2017-05-01 NOTE — Progress Notes (Addendum)
Blood pressure (!) 151/107, pulse 88, temperature 98.2 F (36.8 C), temperature source Oral, resp. rate (!) 24, height 5\' 4"  (1.626 m), weight 114.8 kg , last menstrual period 02/29/2012, SpO2 100 %. Paged Mannam to make aware and request diet change.  Attending also made aware

## 2017-05-01 NOTE — Progress Notes (Addendum)
Pt noted to have SpO2 of 1100% on NRB.  Maintained SpO2 of 100% during neb treatment.  Will transition to , and wean as indicated.  Pt states that breathing feels better at this time, Pt returned to NRB until post procedure repeat CXR completed per RN

## 2017-05-01 NOTE — Progress Notes (Signed)
PROGRESS NOTE    Kristin Burnett  AOZ:[id number] DOB: 12-Jul-1965 DOA: 04/29/2017 PCP: Patient, No Pcp Per   Brief Narrative: Kristin Burnett is a 52 y.o. female with medical history significant of bronchospasm, HTN, recently treated CAP who presented to the ED with worsening cough, malaise, muscle soreness. Patient was initially seen on 11/15 in urgent care for cough, found to have bronchopneumonia and prescribed levaquin and albuterol. Patient seen in ED for follow up on 12/5 with continued symptoms. Patient was discharged from ED with Zpak. Patient completed course with some improvement, however after finishing course, symptoms again worsened. Patient reports chest tightness with myalgias involving shoulders, chills, weakness, decreased appetite.   ED Course: In the ED, patient was noted to have increased tachypnea with RR in the 30's, CXR with findings suggestive of LLL airspace disease. CT chest with findings worrisome for bilateral airspace disease concerning for organizing PNA of infectious/inflammatory etiology. Also noted were scattered small B pulmonary nodules, potentially infectious/inflammatory although metastatic disease cannot be excluded. Patient was started on empiric azithromycin and rocephin. Flu and respiratory viral panel ordered. Hospitalist consulted for consideration for admission.    Assessment & Plan:   Principal Problem:   Pneumonia of both lungs due to infectious organism Active Problems:   Essential hypertension, benign   GERD (gastroesophageal reflux disease)   Bronchospasm   Anxiety and depression   Tachypnea   Pneumonia  1-? PNA; Bilateral pulmonary infiltrates.  Presents with cough, dyspnea, pleuritic chest pain.  CT chest ; Bilateral airspace opacities , organizing PNA infectious/inflammatory.  She has been treated twice over last 2 months with antibiotics. Will ask pulmonary to see. Does patient needs bronchoscopy to rule out infectious, inflammation,  malignancy,.   I will continue with IV antibiotics.  ESR, ANA, CRP 20, Double strand DNA 2 . RF elevated at 18.  order nebulizer treatment,  Underwent bronchoscopy, small pneumothorax post procedure.  Strep and legionella antigen negative.   2-Acute Hypoxic Respiratory Failure;  In setting of possible PNA.  Check ECHO./  Underwent bronchoscopy, small pneumothorax post procedure.  CCM following, plan for FU chest x ray   3-Hyponatremia; continue with IV fluids.   4-AKI; metabolic acidosis;  Continue with IV fluids.  Cr mildly increase today    5-Pulmonary nodule; needs follow up CY chest patient is aware.   6-Irregular contour of kidney.  Needs follow up MRI when able to tolerate test. Patient is aware.   Chest pain;  check ECHO.  Report orthopnea.   HTN; hold diuretics. Continue with metoprolol.   Transaminases; alkaline phosphatase elevated.  Check US>        DVT prophylaxis:Lovenox Code Status: full code.  Family Communication:  Disposition Plan: remain inpatient.    Consultants:   none   Procedures: none   Antimicrobials:   Ceftriaxone 1-06  Azithromycin 1-06   Subjective: Report breathing better.     Objective: Vitals:   05/01/17 1304 05/01/17 1327 05/01/17 1333 05/01/17 1334  BP: (!) 130/107   (!) 154/109  Pulse: 86   (!) 102  Resp: 18   (!) 22  Temp:      TempSrc:      SpO2: 100% 97% 100% 100%  Weight:      Height:        Intake/Output Summary (Last 24 hours) at 05/01/2017 1436 Last data filed at 04/30/2017 2300 Gross per 24 hour  Intake 400 ml  Output -  Net 400 ml   [employer]  04/30/17 1558  Weight: 114.8 kg (253 lb 1.4 oz)    Examination:  General exam: NAD, NB mask  Respiratory system: Bilateral ronchus.  Cardiovascular system: S 1, S 2 RRR Gastrointestinal system: BS present, soft, nt Central nervous system: non focal.  Extremities: symmetric power.  Skin; no rash    Data Reviewed: I have personally  reviewed following labs and imaging studies  CBC: Recent Labs  Lab 04/29/17 1127 04/29/17 1724 04/30/17 0145 04/30/17 1558 05/01/17 0346  WBC 15.9* 14.9* 13.7* 12.6* 11.4*  NEUTROABS  --   --   --  8.7* 7.9*  HGB 11.5* 10.5* 10.4* 10.9* 9.9*  HCT 36.3 33.8* 31.9* 35.3* 32.4*  MCV 87.5 87.6 87.9 88.7 87.8  PLT 291 274 261 258 696   Basic Metabolic Panel: Recent Labs  Lab 04/29/17 1127 04/29/17 1724 04/30/17 0145 05/01/17 0346  NA 133*  --  133* 134*  K 3.8  --  3.9 3.6  CL 101  --  106 107  CO2 23  --  18* 19*  GLUCOSE 122*  --  119* 108*  BUN 18  --  16 14  CREATININE 1.72* 1.59* 1.52* 1.62*  CALCIUM 9.1  --  8.3* 8.3*   GFR: Estimated Creatinine Clearance: 52.2 mL/min (A) (by C-G formula based on SCr of 1.62 mg/dL (H)). Liver Function Tests: Recent Labs  Lab 04/30/17 0145 05/01/17 0346  AST 18 19  ALT 17 15  ALKPHOS 205* 222*  BILITOT 1.1 0.9  PROT 6.6 6.7  ALBUMIN 2.8* 2.5*   No results for input(s): LIPASE, AMYLASE in the last 168 hours. No results for input(s): AMMONIA in the last 168 hours. Coagulation Profile: Recent Labs  Lab 04/30/17 1558  INR 1.13   Cardiac Enzymes: No results for input(s): CKTOTAL, CKMB, CKMBINDEX, TROPONINI in the last 168 hours. BNP (last 3 results) No results for input(s): PROBNP in the last 8760 hours. HbA1C: No results for input(s): HGBA1C in the last 72 hours. CBG: No results for input(s): GLUCAP in the last 168 hours. Lipid Profile: No results for input(s): CHOL, HDL, LDLCALC, TRIG, CHOLHDL, LDLDIRECT in the last 72 hours. Thyroid Function Tests: No results for input(s): TSH, T4TOTAL, FREET4, T3FREE, THYROIDAB in the last 72 hours. Anemia Panel: No results for input(s): VITAMINB12, FOLATE, FERRITIN, TIBC, IRON, RETICCTPCT in the last 72 hours. Sepsis Labs: Recent Labs  Lab 04/30/17 0036 04/30/17 1558  PROCALCITON  --  0.26  LATICACIDVEN 0.8  --     Recent Results (from the past 240 hour(s))  Respiratory  Panel by PCR     Status: None   Collection Time: 04/29/17  5:43 PM  Result Value Ref Range Status   Adenovirus NOT DETECTED NOT DETECTED Final   Coronavirus 229E NOT DETECTED NOT DETECTED Final   Coronavirus HKU1 NOT DETECTED NOT DETECTED Final   Coronavirus NL63 NOT DETECTED NOT DETECTED Final   Coronavirus OC43 NOT DETECTED NOT DETECTED Final   Metapneumovirus NOT DETECTED NOT DETECTED Final   Rhinovirus / Enterovirus NOT DETECTED NOT DETECTED Final   Influenza A NOT DETECTED NOT DETECTED Final   Influenza B NOT DETECTED NOT DETECTED Final   Parainfluenza Virus 1 NOT DETECTED NOT DETECTED Final   Parainfluenza Virus 2 NOT DETECTED NOT DETECTED Final   Parainfluenza Virus 3 NOT DETECTED NOT DETECTED Final   Parainfluenza Virus 4 NOT DETECTED NOT DETECTED Final   Respiratory Syncytial Virus NOT DETECTED NOT DETECTED Final   Bordetella pertussis NOT DETECTED NOT DETECTED Final  Chlamydophila pneumoniae NOT DETECTED NOT DETECTED Final   Mycoplasma pneumoniae NOT DETECTED NOT DETECTED Final  Culture, blood (routine x 2) Call MD if unable to obtain prior to antibiotics being given     Status: None (Preliminary result)   Collection Time: 04/29/17  8:15 PM  Result Value Ref Range Status   Specimen Description BLOOD RIGHT ANTECUBITAL  Final   Special Requests   Final    BOTTLES DRAWN AEROBIC AND ANAEROBIC Blood Culture adequate volume   Culture NO GROWTH 2 DAYS  Final   Report Status PENDING  Incomplete  Culture, blood (routine x 2) Call MD if unable to obtain prior to antibiotics being given     Status: None (Preliminary result)   Collection Time: 04/29/17  8:30 PM  Result Value Ref Range Status   Specimen Description BLOOD BLOOD RIGHT FOREARM  Final   Special Requests   Final    BOTTLES DRAWN AEROBIC AND ANAEROBIC Blood Culture adequate volume   Culture NO GROWTH 2 DAYS  Final   Report Status PENDING  Incomplete  Culture, sputum-assessment     Status: None   Collection Time: 04/30/17   2:06 PM  Result Value Ref Range Status   Specimen Description SPUTUM  Final   Special Requests NONE  Final   Sputum evaluation THIS SPECIMEN IS ACCEPTABLE FOR SPUTUM CULTURE  Final   Report Status 04/30/2017 FINAL  Final  Culture, respiratory (NON-Expectorated)     Status: None (Preliminary result)   Collection Time: 04/30/17  2:06 PM  Result Value Ref Range Status   Specimen Description SPUTUM  Final   Special Requests NONE Reflexed from [id number]  Final   Gram Stain   Final    MODERATE WBC PRESENT, PREDOMINANTLY PMN FEW MODERATE GRAM POSITIVE COCCI IN PAIRS FEW GRAM NEGATIVE RODS    Culture TOO YOUNG TO READ  Final   Report Status PENDING  Incomplete         Radiology Studies: Ct Chest Wo Contrast  Result Date: 04/29/2017 CLINICAL DATA:  Cough with persistent shortness of breath. EXAM: CT CHEST WITHOUT CONTRAST TECHNIQUE: Multidetector CT imaging of the chest was performed following the standard protocol without IV contrast. COMPARISON:  None. FINDINGS: Cardiovascular: Heart is enlarged. Trace pericardial effusion noted. Coronary artery calcification is evident. Mediastinum/Nodes: No mediastinal lymphadenopathy. No evidence for gross hilar lymphadenopathy although assessment is limited by the lack of intravenous contrast on today's study. The esophagus has normal imaging features. Small subpectoral and axillary lymph nodes are seen bilaterally. Lungs/Pleura: Areas of atelectasis and ill-defined ground-glass attenuation are noted bilaterally with scattered areas of focal airspace consolidation. Areas in both suggests a "reverse halo sign" . Scattered tiny ill-defined pulmonary nodules are evident bilaterally. Tiny left pleural effusion noted. Upper Abdomen: Liver is prominent but incompletely visualized. Irregular contour right upper pole kidney may be scarring although mass lesion not excluded. Musculoskeletal: Bone windows reveal no worrisome lytic or sclerotic osseous lesions. IMPRESSION:  1. Bilateral airspace opacities, some of which have a a more dense rim and central ground-glass attenuation. Organizing pneumonia of infectious/inflammatory etiology would be a consideration. 2. Scattered small bilateral pulmonary nodules, potentially postinfectious/postinflammatory. Metastatic disease cannot be excluded. Follow-up is recommended to ensure resolution/stability. Repeat CT chest in 3 months recommended. 3. Irregular contour upper pole right kidney. This may be related to cortical scarring but renal mass is a concern. Follow-up CT or MRI of the abdomen recommended to further evaluate. Electronically Signed   By: Verda Cumins.D.  On: 04/29/2017 16:14   Dg Chest Port 1 View  Result Date: 05/01/2017 CLINICAL DATA:  Status post bronchoscopy EXAM: PORTABLE CHEST 1 VIEW COMPARISON:  04/29/2017 FINDINGS: Cardiac shadow remains enlarged accentuated by the portable technique. Patchy bilateral infiltrates are noted increased when compared with the prior plain film examination particularly in the right lung base. Portion of this may be related to the recent bronchoscopy. Clinical correlation is recommended. A tiny right apical pneumothorax is noted. No other focal abnormality is seen. IMPRESSION: Tiny right apical pneumothorax following bronchoscopy. Patchy infiltrates in the base increased from the prior exam. A portion of this may be related to post bronchoscopy/biopsy changes. These results will be called to the ordering clinician or representative by the Radiologist Assistant, and communication documented in the PACS or zVision Dashboard. Electronically Signed   By: Inez Catalina M.D.   On: 05/01/2017 10:10   Dg C-arm Bronchoscopy  Result Date: 05/01/2017 C-ARM BRONCHOSCOPY: Fluoroscopy was utilized by the requesting physician.  No radiographic interpretation.        Scheduled Meds: . enoxaparin (LOVENOX) injection  40 mg Subcutaneous Q24H  . ipratropium-albuterol  3 mL Nebulization Q6H  .  metoprolol tartrate  50 mg Oral BID  . pantoprazole  40 mg Oral Daily  . polyethylene glycol  17 g Oral Daily   Continuous Infusions: . sodium chloride 100 mL/hr at 05/01/17 1058  . sodium chloride 10 mL/hr at 05/01/17 0830  . azithromycin Stopped (04/30/17 1019)  . cefTRIAXone (ROCEPHIN)  IV Stopped (04/30/17 0948)     LOS: 2 days    Time spent: 35 minutes.     Elmarie Shiley, MD [facility] Pager [phone]  If 7PM-7AM, please contact night-coverage [email or web address] Password [email or web address] 05/01/2017, 2:36 PM

## 2017-05-02 ENCOUNTER — Inpatient Hospital Stay (HOSPITAL_COMMUNITY): Payer: Self-pay

## 2017-05-02 ENCOUNTER — Telehealth: Payer: Self-pay

## 2017-05-02 ENCOUNTER — Other Ambulatory Visit: Payer: Self-pay

## 2017-05-02 DIAGNOSIS — I361 Nonrheumatic tricuspid (valve) insufficiency: Secondary | ICD-10-CM

## 2017-05-02 DIAGNOSIS — J189 Pneumonia, unspecified organism: Secondary | ICD-10-CM

## 2017-05-02 DIAGNOSIS — J181 Lobar pneumonia, unspecified organism: Principal | ICD-10-CM

## 2017-05-02 LAB — CULTURE, RESPIRATORY W GRAM STAIN

## 2017-05-02 LAB — BASIC METABOLIC PANEL
ANION GAP: 6 (ref 5–15)
BUN: 11 mg/dL (ref 6–20)
CHLORIDE: 108 mmol/L (ref 101–111)
CO2: 21 mmol/L — ABNORMAL LOW (ref 22–32)
Calcium: 8.4 mg/dL — ABNORMAL LOW (ref 8.9–10.3)
Creatinine, Ser: 1.37 mg/dL — ABNORMAL HIGH (ref 0.44–1.00)
GFR calc Af Amer: 52 mL/min — ABNORMAL LOW (ref >60)
GFR calc non Af Amer: 44 mL/min — ABNORMAL LOW (ref >60)
GLUCOSE: 92 mg/dL (ref 65–99)
POTASSIUM: 3.8 mmol/L (ref 3.5–5.1)
SODIUM: 135 mmol/L (ref 135–145)

## 2017-05-02 LAB — CULTURE, RESPIRATORY: CULTURE: NORMAL

## 2017-05-02 LAB — ACID FAST SMEAR (AFB, MYCOBACTERIA): Acid Fast Smear: NEGATIVE

## 2017-05-02 LAB — ECHOCARDIOGRAM COMPLETE
Height: 64 in
Weight: 4049.41 oz

## 2017-05-02 LAB — IMMUNOGLOBULINS A/E/G/M, SERUM
IGA: 372 mg/dL — AB (ref 87–352)
IGG (IMMUNOGLOBIN G), SERUM: 1498 mg/dL (ref 700–1600)
IgE (Immunoglobulin E), Serum: <2 IU/mL (ref 0–100)
IgM (Immunoglobulin M), Srm: 399 mg/dL — ABNORMAL HIGH (ref 26–217)

## 2017-05-02 LAB — CBC
HCT: 32.2 % — ABNORMAL LOW (ref 36.0–46.0)
HEMOGLOBIN: 10.1 g/dL — AB (ref 12.0–15.0)
MCH: 27.6 pg (ref 26.0–34.0)
MCHC: 31.4 g/dL (ref 30.0–36.0)
MCV: 88 fL (ref 78.0–100.0)
PLATELETS: 280 10*3/uL (ref 150–400)
RBC: 3.66 MIL/uL — AB (ref 3.87–5.11)
RDW: 14.1 % (ref 11.5–15.5)
WBC: 10.9 10*3/uL — AB (ref 4.0–10.5)

## 2017-05-02 MED ORDER — HYDRALAZINE HCL 20 MG/ML IJ SOLN
10.0000 mg | Freq: Three times a day (TID) | INTRAMUSCULAR | Status: DC | PRN
  Filled 2017-05-02: qty 1

## 2017-05-02 MED ORDER — AMLODIPINE BESYLATE 5 MG PO TABS
5.0000 mg | ORAL_TABLET | Freq: Every day | ORAL | Status: DC
  Administered 2017-05-02 – 2017-05-03 (×2): 5 mg via ORAL
  Filled 2017-05-02 (×2): qty 1

## 2017-05-02 MED ORDER — BUDESONIDE 0.5 MG/2ML IN SUSP
0.5000 mg | Freq: Two times a day (BID) | RESPIRATORY_TRACT | Status: DC
  Administered 2017-05-02 – 2017-05-03 (×3): 0.5 mg via RESPIRATORY_TRACT
  Filled 2017-05-02 (×3): qty 2

## 2017-05-02 MED ORDER — METHYLPREDNISOLONE SODIUM SUCC 40 MG IJ SOLR
40.0000 mg | Freq: Two times a day (BID) | INTRAMUSCULAR | Status: DC
  Administered 2017-05-02 – 2017-05-03 (×3): 40 mg via INTRAVENOUS
  Filled 2017-05-02 (×3): qty 1

## 2017-05-02 MED ORDER — IPRATROPIUM-ALBUTEROL 0.5-2.5 (3) MG/3ML IN SOLN
3.0000 mL | Freq: Three times a day (TID) | RESPIRATORY_TRACT | Status: DC
  Administered 2017-05-03: 3 mL via RESPIRATORY_TRACT
  Filled 2017-05-02: qty 3

## 2017-05-02 NOTE — Progress Notes (Addendum)
PROGRESS NOTE    Kristin Burnett  TDD:[id number] DOB: February 09, 1966 DOA: 04/29/2017 PCP: Patient, No Pcp Per   Brief Narrative: Kristin Burnett is a 52 y.o. female with medical history significant of bronchospasm, HTN, recently treated CAP who presented to the ED with worsening cough, malaise, muscle soreness. Patient was initially seen on 11/15 in urgent care for cough, found to have bronchopneumonia and prescribed levaquin and albuterol. Patient seen in ED for follow up on 12/5 with continued symptoms. Patient was discharged from ED with Zpak. Patient completed course with some improvement, however after finishing course, symptoms again worsened. Patient reports chest tightness with myalgias involving shoulders, chills, weakness, decreased appetite.   ED Course: In the ED, patient was noted to have increased tachypnea with RR in the 30's, CXR with findings suggestive of LLL airspace disease. CT chest with findings worrisome for bilateral airspace disease concerning for organizing PNA of infectious/inflammatory etiology. Also noted were scattered small B pulmonary nodules, potentially infectious/inflammatory although metastatic disease cannot be excluded. Patient was started on empiric azithromycin and rocephin. Flu and respiratory viral panel ordered. Hospitalist consulted for consideration for admission.  Patient admitted with recurrent bilateral pulmonary infiltrates, hypoxic respiratory failure. She was started on IV antibiotics. Underwent bronchoscopy. She develops small pneumothorax pots procedure. She was also started on steroids. Follow result of culture from bronchoscopy    Assessment & Plan:   Principal Problem:   Pneumonia of both lungs due to infectious organism Active Problems:   Essential hypertension, benign   GERD (gastroesophageal reflux disease)   Bronchospasm   Anxiety and depression   Tachypnea   Pneumonia  1-? PNA; Bilateral pulmonary infiltrates.  -Presents with cough,  dyspnea, pleuritic chest pain.  -CT chest ; Bilateral airspace opacities , organizing PNA infectious/inflammatory.  She has been treated twice over last 2 months with antibiotics. Will ask pulmonary to see. Does patient needs bronchoscopy to rule out infectious, inflammation, malignancy,.   -I will continue with IV antibiotics.  -ANA positive, CRP 20, Double strand DNA 2 . RF elevated at 18. CCP negative  -Continue with nebulizer treatment,  -Underwent bronchoscopy, small pneumothorax post procedure. bc pending./ Strep and legionella antigen negative.  Started on IV solumedrol. Discussed with Dr. Vaughan Browner , discharge patient on 2 days if stable on prednisone 40 mg daily  And taper 10 mg every 3 days. She has appointment with him 1--16 Bronchial lavage ; culture, acid fast, pneumocystis pending.   2-Acute Hypoxic Respiratory Failure;  In setting of possible PNA.  Check ECHO./ pending Underwent bronchoscopy, small pneumothorax post procedure.  Pneumothorax stable.   3-Hyponatremia; continue with IV fluids. Improved.   4-AKI; metabolic acidosis;  Continue with IV fluids.  Improved with IV fluids.    5-Pulmonary nodule; needs follow up.   6-Irregular contour of kidney.  Needs follow up MRI when able to tolerate test. Patient is aware.   Chest pain;  ECHO. pending Report orthopnea.   HTN; hold diuretics due to AKI. Continue with metoprolol. Start norvasc.   Transaminases; alkaline phosphatase elevated.  US> Prominent liver without focal liver lesion evident. No gallbladder or biliary duct pathology.  Sepsis rule out.      DVT prophylaxis:Lovenox Code Status: full code.  Family Communication:  Disposition Plan: remain inpatient.    Consultants:   none   Procedures: none   Antimicrobials:   Ceftriaxone 1-06  Azithromycin 1-06   Subjective: She is breathing better, not at baseline.      Objective: Vitals:  05/02/17 0521 05/02/17 0924 05/02/17 1041  05/02/17 1337  BP:  (!) 155/111  (!) 175/114  Pulse: 98 95  89  Resp: (!) 27   20  Temp:    98.1 F (36.7 C)  TempSrc:    Oral  SpO2: 100%  99% (!) 89%  Weight:      Height:        Intake/Output Summary (Last 24 hours) at 05/02/2017 1421 Last data filed at 05/02/2017 1124 Gross per 24 hour  Intake 2655.58 ml  Output -  Net 2655.58 ml   Filed Weights   04/30/17 1558  Weight: 114.8 kg (253 lb 1.4 oz)    Examination:  General exam: NAD Respiratory system: Bilateral wheezing.  Cardiovascular system: S 1, S 2 RRR Gastrointestinal system: BS present, soft, nt Central nervous system: non focal.  Extremities; symmetric power.  Skin; no rash    Data Reviewed: I have personally reviewed following labs and imaging studies  CBC: Recent Labs  Lab 04/29/17 1724 04/30/17 0145 04/30/17 1558 05/01/17 0346 05/02/17 0803  WBC 14.9* 13.7* 12.6* 11.4* 10.9*  NEUTROABS  --   --  8.7* 7.9*  --   HGB 10.5* 10.4* 10.9* 9.9* 10.1*  HCT 33.8* 31.9* 35.3* 32.4* 32.2*  MCV 87.6 87.9 88.7 87.8 88.0  PLT 274 261 258 262 409   Basic Metabolic Panel: Recent Labs  Lab 04/29/17 1127 04/29/17 1724 04/30/17 0145 05/01/17 0346 05/02/17 0803  NA 133*  --  133* 134* 135  K 3.8  --  3.9 3.6 3.8  CL 101  --  106 107 108  CO2 23  --  18* 19* 21*  GLUCOSE 122*  --  119* 108* 92  BUN 18  --  _0 CREATININE 1.72* 1.59* 1.52* 1.62* 1.37*  CALCIUM 9.1  --  8.3* 8.3* 8.4*   GFR: Estimated Creatinine Clearance: 61.7 mL/min (A) (by C-G formula based on SCr of 1.37 mg/dL (H)). Liver Function Tests: Recent Labs  Lab 04/30/17 0145 05/01/17 0346  AST 18 19  ALT 17 15  ALKPHOS 205* 222*  BILITOT 1.1 0.9  PROT 6.6 6.7  ALBUMIN 2.8* 2.5*   No results for input(s): LIPASE, AMYLASE in the last 168 hours. No results for input(s): AMMONIA in the last 168 hours. Coagulation Profile: Recent Labs  Lab 04/30/17 1558  INR 1.13   Cardiac Enzymes: No results for input(s): CKTOTAL, CKMB,  CKMBINDEX, TROPONINI in the last 168 hours. BNP (last 3 results) No results for input(s): PROBNP in the last 8760 hours. HbA1C: No results for input(s): HGBA1C in the last 72 hours. CBG: No results for input(s): GLUCAP in the last 168 hours. Lipid Profile: No results for input(s): CHOL, HDL, LDLCALC, TRIG, CHOLHDL, LDLDIRECT in the last 72 hours. Thyroid Function Tests: No results for input(s): TSH, T4TOTAL, FREET4, T3FREE, THYROIDAB in the last 72 hours. Anemia Panel: No results for input(s): VITAMINB12, FOLATE, FERRITIN, TIBC, IRON, RETICCTPCT in the last 72 hours. Sepsis Labs: Recent Labs  Lab 04/30/17 0036 04/30/17 1558  PROCALCITON  --  0.26  LATICACIDVEN 0.8  --     Recent Results (from the past 240 hour(s))  Respiratory Panel by PCR     Status: None   Collection Time: 04/29/17  5:43 PM  Result Value Ref Range Status   Adenovirus NOT DETECTED NOT DETECTED Final   Coronavirus 229E NOT DETECTED NOT DETECTED Final   Coronavirus HKU1 NOT DETECTED NOT DETECTED Final   Coronavirus NL63 NOT DETECTED  NOT DETECTED Final   Coronavirus OC43 NOT DETECTED NOT DETECTED Final   Metapneumovirus NOT DETECTED NOT DETECTED Final   Rhinovirus / Enterovirus NOT DETECTED NOT DETECTED Final   Influenza A NOT DETECTED NOT DETECTED Final   Influenza B NOT DETECTED NOT DETECTED Final   Parainfluenza Virus 1 NOT DETECTED NOT DETECTED Final   Parainfluenza Virus 2 NOT DETECTED NOT DETECTED Final   Parainfluenza Virus 3 NOT DETECTED NOT DETECTED Final   Parainfluenza Virus 4 NOT DETECTED NOT DETECTED Final   Respiratory Syncytial Virus NOT DETECTED NOT DETECTED Final   Bordetella pertussis NOT DETECTED NOT DETECTED Final   Chlamydophila pneumoniae NOT DETECTED NOT DETECTED Final   Mycoplasma pneumoniae NOT DETECTED NOT DETECTED Final  Culture, blood (routine x 2) Call MD if unable to obtain prior to antibiotics being given     Status: None (Preliminary result)   Collection Time: 04/29/17  8:15  PM  Result Value Ref Range Status   Specimen Description BLOOD RIGHT ANTECUBITAL  Final   Special Requests   Final    BOTTLES DRAWN AEROBIC AND ANAEROBIC Blood Culture adequate volume   Culture NO GROWTH 3 DAYS  Final   Report Status PENDING  Incomplete  Culture, blood (routine x 2) Call MD if unable to obtain prior to antibiotics being given     Status: None (Preliminary result)   Collection Time: 04/29/17  8:30 PM  Result Value Ref Range Status   Specimen Description BLOOD BLOOD RIGHT FOREARM  Final   Special Requests   Final    BOTTLES DRAWN AEROBIC AND ANAEROBIC Blood Culture adequate volume   Culture NO GROWTH 3 DAYS  Final   Report Status PENDING  Incomplete  Culture, sputum-assessment     Status: None   Collection Time: 04/30/17  2:06 PM  Result Value Ref Range Status   Specimen Description SPUTUM  Final   Special Requests NONE  Final   Sputum evaluation THIS SPECIMEN IS ACCEPTABLE FOR SPUTUM CULTURE  Final   Report Status 04/30/2017 FINAL  Final  Culture, respiratory (NON-Expectorated)     Status: None (Preliminary result)   Collection Time: 04/30/17  2:06 PM  Result Value Ref Range Status   Specimen Description SPUTUM  Final   Special Requests NONE Reflexed from [id number]  Final   Gram Stain   Final    MODERATE WBC PRESENT, PREDOMINANTLY PMN FEW MODERATE GRAM POSITIVE COCCI IN PAIRS FEW GRAM NEGATIVE RODS    Culture CULTURE REINCUBATED FOR BETTER GROWTH  Final   Report Status PENDING  Incomplete  Culture, bal-quantitative     Status: None (Preliminary result)   Collection Time: 05/01/17  9:30 AM  Result Value Ref Range Status   Specimen Description BRONCHIAL ALVEOLAR LAVAGE  Final   Special Requests Normal  Final   Gram Stain   Final    RARE WBC PRESENT, PREDOMINANTLY MONONUCLEAR NO ORGANISMS SEEN    Culture NO GROWTH < 24 HOURS  Final   Report Status PENDING  Incomplete         Radiology Studies: Dg Chest 2 View  Result Date: 05/02/2017 CLINICAL DATA:   Pneumothorax with shortness of breath. EXAM: CHEST  2 VIEW COMPARISON:  05/01/2017. FINDINGS: The heart is enlarged. There is mild vascular congestion. Bibasilar consolidation or atelectasis is redemonstrated, not significantly improved. Tiny RIGHT apical pneumothorax. IMPRESSION: Stable chest. Electronically Signed   By: Staci Righter M.D.   On: 05/02/2017 08:12   Dg Chest 2 View  Result Date: 05/01/2017  CLINICAL DATA:  Evaluate for pneumothorax post bronchoscopy. EXAM: CHEST  2 VIEW COMPARISON:  Earlier same day. FINDINGS: The cardiac silhouette is enlarged. Mediastinal contours appear intact. Persistent tiny right apical pneumothorax. Increased patchy airspace opacities in the right lung base. Atelectatic changes in the left mid lung field. Persistent left pleural effusion/airspace consolidation. Osseous structures are without acute abnormality. Soft tissues are grossly normal. IMPRESSION: Persistent tiny right apical pneumothorax, stable to slightly smaller than on the prior radiograph. Increased patchy airspace opacities in the right lung base, may represent post bronchoscopy/post biopsy changes. Persistent left lower lobe atelectasis/airspace consolidation and pleural effusion. Electronically Signed   By: Fidela Salisbury M.D.   On: 05/01/2017 16:45   Dg Chest Port 1 View  Result Date: 05/01/2017 CLINICAL DATA:  Status post bronchoscopy EXAM: PORTABLE CHEST 1 VIEW COMPARISON:  04/29/2017 FINDINGS: Cardiac shadow remains enlarged accentuated by the portable technique. Patchy bilateral infiltrates are noted increased when compared with the prior plain film examination particularly in the right lung base. Portion of this may be related to the recent bronchoscopy. Clinical correlation is recommended. A tiny right apical pneumothorax is noted. No other focal abnormality is seen. IMPRESSION: Tiny right apical pneumothorax following bronchoscopy. Patchy infiltrates in the base increased from the prior exam.  A portion of this may be related to post bronchoscopy/biopsy changes. These results will be called to the ordering clinician or representative by the Radiologist Assistant, and communication documented in the PACS or zVision Dashboard. Electronically Signed   By: Inez Catalina M.D.   On: 05/01/2017 10:10   US Abdomen Limited Ruq  Result Date: 05/02/2017 CLINICAL DATA:  Elevated liver enzymes EXAM: ULTRASOUND ABDOMEN LIMITED RIGHT UPPER QUADRANT COMPARISON:  None. FINDINGS: Gallbladder: No gallstones or wall thickening visualized. There is no pericholecystic fluid. No sonographic Murphy sign noted by sonographer. Common bile duct: Diameter: 3 mm. No intrahepatic or extrahepatic biliary duct dilatation. Liver: Liver measures 20.7 cm in length. No focal lesion identified. Within normal limits in parenchymal echogenicity. Portal vein is patent on color Doppler imaging with normal direction of blood flow towards the liver. There is a small right pleural effusion. IMPRESSION: Prominent liver without focal liver lesion evident. No gallbladder or biliary duct pathology. Small right pleural effusion noted. Electronically Signed   By: Lowella Grip III M.D.   On: 05/02/2017 09:06   Dg C-arm Bronchoscopy  Result Date: 05/01/2017 C-ARM BRONCHOSCOPY: Fluoroscopy was utilized by the requesting physician.  No radiographic interpretation.        Scheduled Meds: . amLODipine  5 mg Oral Daily  . budesonide (PULMICORT) nebulizer solution  0.5 mg Nebulization BID  . enoxaparin (LOVENOX) injection  40 mg Subcutaneous Q24H  . ipratropium-albuterol  3 mL Nebulization Q6H  . methylPREDNISolone (SOLU-MEDROL) injection  40 mg Intravenous Q12H  . metoprolol tartrate  50 mg Oral BID  . pantoprazole  40 mg Oral Daily  . polyethylene glycol  17 g Oral Daily   Continuous Infusions: . sodium chloride 100 mL/hr at 05/02/17 1330  . sodium chloride Stopped (05/02/17 0800)  . azithromycin Stopped (05/02/17 1147)  .  cefTRIAXone (ROCEPHIN)  IV Stopped (05/02/17 0959)     LOS: 3 days    Time spent: 35 minutes.     Elmarie Shiley, MD [facility] Pager [phone]  If 7PM-7AM, please contact night-coverage [email or web address] Password [email or web address] 05/02/2017, 2:21 PM

## 2017-05-02 NOTE — Progress Notes (Signed)
  Echocardiogram 2D Echocardiogram has been performed.  Kristin Burnett 05/02/2017, 10:42 AM

## 2017-05-02 NOTE — Progress Notes (Addendum)
PULMONARY / CRITICAL CARE MEDICINE   Name: Kristin Burnett MRN: [id number] DOB: 1965/09/16    ADMISSION DATE:  04/29/2017 CONSULTATION DATE:  04/30/17  REFERRING MD:  Niel Hummer, MD  CHIEF COMPLAINT:  Recurrent pneumonia  HISTORY OF PRESENT ILLNESS:   52 year old with history of dysfunctional uterine bleeding, GERD, hypertension, active smoker presenting with recurrent pneumonia.  She was treated on 11/5 in the ED with Levaquin which improved her symptoms but recurred.  She was seen again on 12/8 when she got a Z-Pak.  She never got steroids.  She returns with cough, white mucus production for the past week with chills, weakness, decreased appetite.  She is an active smoker with 15-30-pack-year smoking history.  She was given Symbicort and albuterol by her outpatient physician but he uses this intermittently.  She does not have any significant exposures recently.  She reports mold in her apartment 2 years ago.  No joint pain, rash or morning stiffness.  Pets: No pets at home, no birds or exposure to farm animals Occupation: Works in [employer] for a [employer]. Exposures: Exposed to mold 2 years ago.  No recent exposure.  No hot tubs, Jacuzzi Smoking history: 1/2 pack per day smoker.  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  PAST SURGICAL HISTORY: She  has a past surgical history that includes Tubal ligation; Laser ablation; Robotic assisted total hysterectomy (03/07/2012); Bilateral salpingectomy (03/07/2012); and Cystoscopy (03/07/2012).  No Known Allergies  No current facility-administered medications on file prior to  encounter.    Current Outpatient Medications on File Prior to Encounter  Medication Sig  . albuterol (PROVENTIL HFA) 108 (90 Base) MCG/ACT inhaler Inhale 2 puffs into the lungs every 6 (six) hours as needed for wheezing or shortness of breath.  . budesonide-formoterol (SYMBICORT) 160-4.5 MCG/ACT inhaler Inhale 2 puffs into the lungs 2 (two) times daily. (Patient taking differently: Inhale 2 puffs into the lungs 2 (two) times daily as needed ("for respiratory flares"). )  . lisinopril-hydrochlorothiazide (PRINZIDE,ZESTORETIC) 20-25 MG tablet Take 1 tablet by mouth daily.  . metoprolol (LOPRESSOR) 50 MG tablet Take 1 tablet (50 mg total) by mouth 2 (two) times daily.  . Simethicone (GAS-X PO) Take 1 tablet by mouth every 4 (four) hours as needed (for gas).  . naproxen (NAPROSYN) 500 MG tablet Take 1 tablet (500 mg total) by mouth 2 (two) times daily with a meal. (Patient not taking: Reported on 04/29/2017)    FAMILY HISTORY:  Her indicated that her mother is alive. She indicated that her father is alive. She indicated that all of her three brothers are alive. She indicated that the status of her neg hx is unknown. She indicated that the status of her other is unknown.   SOCIAL HISTORY: She  reports that she has been smoking cigarettes.  She started smoking about 30 years ago. She has a 18.00 pack-year smoking history. she has never used smokeless tobacco. She reports that she does not drink alcohol or use drugs.  REVIEW OF SYSTEMS:   Constitutional: Positive for fever and chills.  HENT: Negative.   Eyes: Negative for blurred vision.  Respiratory: as per HPI  Cardiovascular: Negative for chest pain and palpitations.  Gastrointestinal: Negative for vomiting, diarrhea, blood per  rectum. Genitourinary: Negative for dysuria, urgency, frequency and hematuria.  Musculoskeletal: Positive for myalgias..  Skin: Negative for itching and rash.  Neurological: Negative for dizziness, tremors, focal  weakness, seizures and loss of consciousness.  Endo/Heme/Allergies: Negative for environmental allergies.  Psychiatric/Behavioral: Negative for depression, suicidal ideas and hallucinations.  All other systems reviewed and are negative.   SUBJECTIVE:  Underwent bronchoscope yesterday. Tolerated well. Tiny apical pneumothorax noted post procedure.  VITAL SIGNS: BP 129/83   Pulse 98   Temp 98.5 F (36.9 C)   Resp (!) 27   Ht 5\' 4"  (1.626 m)   Wt 253 lb 1.4 oz (114.8 kg)   LMP 02/29/2012 (Exact Date)   SpO2 100%   BMI 43.44 kg/m   HEMODYNAMICS:    VENTILATOR SETTINGS:    INTAKE / OUTPUT: I/O last 3 completed shifts: In: 2695.6 [P.O.:400; I.V.:2295.6] Out: -   PHYSICAL EXAMINATION: Gen:      No acute distress HEENT:  EOMI, sclera anicteric Neck:     No masses; no thyromegaly Lungs:    Scattered rhonchi, exp wheeze; normal respiratory effort CV:         Regular rate and rhythm; no murmurs Abd:      + bowel sounds; soft, non-tender; no palpable masses, no distension Ext:    No edema; adequate peripheral perfusion Skin:      Warm and dry; no rash Neuro: alert and oriented x 3 Psych: normal mood and affect  LABS:  BMET Recent Labs  Lab 04/30/17 0145 05/01/17 0346 05/02/17 0803  NA 133* 134* 135  K 3.9 3.6 3.8  CL 106 107 108  CO2 18* 19* 21*  BUN 16 14 11   CREATININE 1.52* 1.62* 1.37*  GLUCOSE 119* 108* 92    Electrolytes Recent Labs  Lab 04/30/17 0145 05/01/17 0346 05/02/17 0803  CALCIUM 8.3* 8.3* 8.4*    CBC Recent Labs  Lab 04/30/17 0145 04/30/17 1558 05/01/17 0346  WBC 13.7* 12.6* 11.4*  HGB 10.4* 10.9* 9.9*  HCT 31.9* 35.3* 32.4*  PLT 261 258 262    Coag's Recent Labs  Lab 04/30/17 1558  INR 1.13    Sepsis Markers Recent Labs  Lab 04/30/17 0036 04/30/17 1558  LATICACIDVEN 0.8  --   PROCALCITON  --  0.26    ABG No results for input(s): PHART, PCO2ART, PO2ART in the last 168 hours.  Liver Enzymes Recent Labs  Lab  04/30/17 0145 05/01/17 0346  AST 18 19  ALT 17 15  ALKPHOS 205* 222*  BILITOT 1.1 0.9  ALBUMIN 2.8* 2.5*    Cardiac Enzymes No results for input(s): TROPONINI, PROBNP in the last 168 hours.  Glucose No results for input(s): GLUCAP in the last 168 hours.  Imaging Chest x-ray 03/08/17-patchy bilateral infiltrates Chest x-ray 03/31/17- persistent patchy bilateral infiltrates Chest x-ray 04/29/17- bilateral interstitial opacities. Chest x-ray 05/02/17- bilateral interstitial opacities. tinly apical pneumothorax  CT chest 04/29/17-bilateral airspace opacities, scattered bilateral pulmonary nodules.  Irregular contour in the right kidney. I have reviewed the images personally.  STUDIES:  Bronchoscopy studies 05/01/17 Cell count WBC 178, 6% eos, 15% lymph, 36% neuts, 43% monocyte macrophage. Cultures- pending  Serologies 04/30/17 ANA positive- reflex panel is pending, CCP 6, ds DNA 2, RA 18.9  HIV 04/30/17- negative  CULTURES: Blood cultures 04/29/17- Neg Sputum Cx 04/30/17- GNR, GPC Respiratory virus panel, flu PCR 04/29/17-negative Urine Strep pneumo, legionella 04/30/17- negative  ANTIBIOTICS: Ceftriaxone 04/30/17> Azithromycin 04/30/17>  SIGNIFICANT EVENTS:  LINES/TUBES:  ASSESSMENT / PLAN: 52 year old with recurrent  pneumonia and bilateral infiltrate. This is her third episode of the past 2 months after appropriate antibiotic therapy.  R/O non-infectious causes including eosinophilic pneumonia, organizing pneumonia, sarcoid There is no clinical evidence of recurrent aspiration, connective tissue disease.   Recurrent pneumonia, B/L infiltrates Follow bronchoscope results.  Continue current antibiotics. Final CTD serologies are pending. Noted to have elevation in ANA, RF but CCP is negative  Active smoker, Suspected COPD exacerbation Continue standing duonebs, pulmicort. Discharge on symbicort 160/4.5 Start steroids for COPD as she is wheezing today. Can transition to prednisone 40  tomorrow and taper by 10 mg every 3 days Will need PFTs as outpatient  Pneumothorax Tiny apical ptx is improved by my read.  Will follow with repeat CXR in clinic  Appointment made in clinic for follow up on 1/16 at 12 pm with repeat imaging.   PCCM will be available as needed for this admission. Please call with question  Marshell Garfinkel MD [facility] Pager [phone] If no answer or after 3pm call: [phone] 05/02/2017, 9:00 AM

## 2017-05-02 NOTE — Telephone Encounter (Signed)
I have spoken with PM verbally, who request that pt be seen next week. Pt has been scheduled for 05/09/17 @ 12:00 Pt is currently admitted. Will route encounter to myself to contact pt, once pt is discharged

## 2017-05-03 ENCOUNTER — Encounter (HOSPITAL_COMMUNITY): Payer: Self-pay | Admitting: Pulmonary Disease

## 2017-05-03 DIAGNOSIS — J9801 Acute bronchospasm: Secondary | ICD-10-CM

## 2017-05-03 DIAGNOSIS — N179 Acute kidney failure, unspecified: Secondary | ICD-10-CM

## 2017-05-03 LAB — CULTURE, BAL-QUANTITATIVE
CULTURE: NO GROWTH
SPECIAL REQUESTS: NORMAL

## 2017-05-03 LAB — ASPERGILLUS ANTIBODY BY IMMUNODIFF
ASPERGILLUS FUMIGATUS IGG: NEGATIVE
Aspergillus flavus: NEGATIVE
Aspergillus niger: NEGATIVE

## 2017-05-03 LAB — CULTURE, BAL-QUANTITATIVE W GRAM STAIN

## 2017-05-03 LAB — PNEUMOCYSTIS JIROVECI SMEAR BY DFA: PNEUMOCYSTIS JIROVECI AG: NEGATIVE

## 2017-05-03 MED ORDER — TIOTROPIUM BROMIDE MONOHYDRATE 18 MCG IN CAPS: 18.0000 ug | ORAL_CAPSULE | Freq: Every day | RESPIRATORY_TRACT | 2 refills | Status: DC

## 2017-05-03 MED ORDER — CEFDINIR 300 MG PO CAPS: 300.0000 mg | ORAL_CAPSULE | Freq: Two times a day (BID) | ORAL | 0 refills | Status: AC

## 2017-05-03 MED ORDER — AZITHROMYCIN 500 MG PO TABS: 500.0000 mg | ORAL_TABLET | Freq: Every day | ORAL | 0 refills | Status: AC

## 2017-05-03 MED ORDER — IPRATROPIUM-ALBUTEROL 0.5-2.5 (3) MG/3ML IN SOLN: 3.0000 mL | Freq: Four times a day (QID) | RESPIRATORY_TRACT | 0 refills | Status: DC | PRN

## 2017-05-03 MED ORDER — CARVEDILOL 12.5 MG PO TABS: 12.5000 mg | ORAL_TABLET | Freq: Two times a day (BID) | ORAL | Status: DC

## 2017-05-03 MED ORDER — PREDNISONE 10 MG PO TABS: ORAL_TABLET | ORAL | 0 refills | Status: DC

## 2017-05-03 MED ORDER — AMLODIPINE BESYLATE 10 MG PO TABS: 10.0000 mg | ORAL_TABLET | Freq: Every day | ORAL | 0 refills | Status: DC

## 2017-05-03 MED ORDER — AMLODIPINE 1 MG/ML ORAL SUSPENSION: 10.0000 mg | Freq: Every day | ORAL | Status: DC

## 2017-05-03 MED ORDER — AMLODIPINE BESYLATE 10 MG PO TABS: 10.0000 mg | ORAL_TABLET | Freq: Every day | ORAL | Status: DC

## 2017-05-03 MED ORDER — LISINOPRIL 20 MG PO TABS: 20.0000 mg | ORAL_TABLET | Freq: Every day | ORAL | 11 refills | Status: DC

## 2017-05-03 MED ORDER — AMLODIPINE BESYLATE 5 MG PO TABS
5.0000 mg | ORAL_TABLET | Freq: Every day | ORAL | Status: DC
  Administered 2017-05-03: 5 mg via ORAL
  Filled 2017-05-03: qty 1

## 2017-05-03 MED ORDER — CARVEDILOL 12.5 MG PO TABS: 12.5000 mg | ORAL_TABLET | Freq: Two times a day (BID) | ORAL | 0 refills | Status: DC

## 2017-05-03 NOTE — Discharge Summary (Signed)
Physician Discharge Summary  Patient ID: Kristin Burnett MRN: [id number] DOB/AGE: Mar 31, 1966 52 y.o.  Admit date: 04/29/2017 Discharge date: 05/03/2017  Admission Diagnoses:  Discharge Diagnoses:  Principal Problem:   Pneumonia of both lungs due to infectious organism Active Problems:   Essential hypertension, benign   GERD (gastroesophageal reflux disease)   Bronchospasm   Anxiety and depression   Tachypnea   Pneumonia   Discharged Condition: stable  Hospital Course: Patient is a 76 year oldfemalewith past medical history significant forbronchospasm, Hypertension, recently treated CAP. Patient was admitted with worsening cough, malaise, muscle soreness. Patient was initially seen on 03/08/2017 at an urgent care facility for cough, found to have bronchopneumonia and prescribed levaquin and albuterol. Patient was seen at the ED for follow up on 03/28/2017 with continued symptoms. Patient was discharged from ED with Z-Pak. Patient completed course of Z-Pak with some improvement, however after finishing course, symptoms again worsened. Patient also reported chest tightness with myalgias involving shoulders, chills, weakness, decreased appetite.On presentation, patient was tachypneic with respiratory rate the 30's/min, CXR with findings suggestive of Left Lower Lobe airspace disease. CT chest with findings worrisome for bilateral airspace disease concerning for organizing PNA of infectious/inflammatory etiology. Also noted were scattered small pulmonary nodules, potentially infectious/inflammatory, although, metastatic disease could not be excluded. Patient was started on empiric azithromycin and rocephin. Flu PCR and respiratory viral panel ordered. Influenza PCR came back negative. With IV antibiotics, patient improved significantly but continued to wheeze. Patient has over 20 Pack years. Pulmonary team was consulted and patient underwent bronchoscopy that was non revealing. Samples have been sent  for cytology and cultures. Patient is currently on treatment for likely undiagnosed COPD with exacerbation. Patient also snores, and has been advised to pursue sleep study on discharge and the patient voiced understanding. Patient has been optimized and will be discharged back home on oral antibiotics. Need for supplemental oxygen on discharge will be assessed.  Consults: pulmonary/intensive care  Significant Diagnostic Studies: bronchoscopy: No significant findings. Follow samples sent for biopsy and culture  Treatments: surgery: See above.  Discharge Exam: Blood pressure (!) 172/103, pulse 92, temperature 97.6 F (36.4 C), temperature source Oral, resp. rate 17, height 5\' 4"  (1.626 m), weight 114.8 kg (253 lb 1.4 oz), last menstrual period 02/29/2012, SpO2 93 %.    Disposition: 01-Home or Self Care  Discharge Instructions    Call MD for:   Complete by:  As directed    Call MD if symptoms worsen   DME Nebulizer machine   Complete by:  As directed    Patient needs a nebulizer to treat with the following condition:  COPD (chronic obstructive pulmonary disease) ([place])   Diet - low sodium heart healthy   Complete by:  As directed    Increase activity slowly   Complete by:  As directed      Allergies as of 05/03/2017   No Known Allergies     Medication List    STOP taking these medications   GAS-X PO   lisinopril-hydrochlorothiazide 20-25 MG tablet Commonly known as:  PRINZIDE,ZESTORETIC   metoprolol tartrate 50 MG tablet Commonly known as:  LOPRESSOR   naproxen 500 MG tablet Commonly known as:  NAPROSYN     TAKE these medications   amLODipine 10 MG tablet Commonly known as:  NORVASC Take 1 tablet (10 mg total) by mouth daily.   azithromycin 500 MG tablet Commonly known as:  ZITHROMAX Take 1 tablet (500 mg total) by mouth daily for 3 days. Take  1 tablet daily for 3 days.   budesonide-formoterol 160-4.5 MCG/ACT inhaler Commonly known as:  SYMBICORT Inhale 2 puffs  into the lungs 2 (two) times daily. What changed:    when to take this  reasons to take this   carvedilol 12.5 MG tablet Commonly known as:  COREG Take 1 tablet (12.5 mg total) by mouth 2 (two) times daily with a meal.   cefdinir 300 MG capsule Commonly known as:  OMNICEF Take 1 capsule (300 mg total) by mouth 2 (two) times daily for 5 days.   ipratropium-albuterol 0.5-2.5 (3) MG/3ML Soln Commonly known as:  DUONEB Take 3 mLs by nebulization every 6 (six) hours as needed.   lisinopril 20 MG tablet Commonly known as:  PRINIVIL,ZESTRIL Take 1 tablet (20 mg total) by mouth daily.   predniSONE 10 MG tablet Commonly known as:  DELTASONE Prednisone 40mg  po once daily for 3 days, then 30mg  po once daily for 3 days, then 20mg  po once daily for 3 days and then 10mg  po once daily for 3 days and stop   PROVENTIL HFA 108 (90 Base) MCG/ACT inhaler Generic drug:  albuterol Inhale 2 puffs into the lungs every 6 (six) hours as needed for wheezing or shortness of breath.   tiotropium 18 MCG inhalation capsule Commonly known as:  SPIRIVA HANDIHALER Place 1 capsule (18 mcg total) into inhaler and inhale daily.            Durable Medical Equipment  (From admission, onward)        Start     Ordered   05/03/17 0000  DME Nebulizer machine    Question:  Patient needs a nebulizer to treat with the following condition  Answer:  COPD (chronic obstructive pulmonary disease) ([place])   05/03/17 1130       Signed: Bonnell Public 05/03/2017, 11:30 AM

## 2017-05-03 NOTE — Progress Notes (Signed)
Dickie La to be D/C'd Home per MD order.  Discussed with the patient and all questions fully answered.  VSS, Skin clean, dry and intact without evidence of skin break down, no evidence of skin tears noted. IV catheter discontinued intact. Site without signs and symptoms of complications. Dressing and pressure applied.  An After Visit Summary was printed and given to the patient. Patient received prescription.  D/c education completed with patient/family including follow up instructions, medication list, d/c activities limitations if indicated, with other d/c instructions as indicated by MD - patient able to verbalize understanding, all questions fully answered.   Patient instructed to return to ED, call 911, or call MD for any changes in condition.   Patient is waiting on her son to pick her up.  Christoper Fabian Giovanne Nickolson 05/03/2017 12:24 PM

## 2017-05-03 NOTE — Progress Notes (Signed)
SATURATION QUALIFICATIONS: (This note is used to comply with regulatory documentation for home oxygen)  Patient Saturations on Room Air at Rest = 100%  Patient Saturations on Room Air while Ambulating = 99%   Please briefly explain why patient needs home oxygen: Not indicated.

## 2017-05-03 NOTE — Progress Notes (Signed)
Pt escorted to exit and D/C'd home via private auto. 

## 2017-05-04 LAB — HYPERSENSITIVITY PNEUMONITIS
A. PULLULANS ABS: NEGATIVE
A.Fumigatus #1 Abs: NEGATIVE
Micropolyspora faeni, IgG: NEGATIVE
PIGEON SERUM ABS: NEGATIVE
THERMOACT. SACCHARII: NEGATIVE
Thermoactinomyces vulgaris, IgG: NEGATIVE

## 2017-05-04 LAB — CULTURE, BLOOD (ROUTINE X 2)
CULTURE: NO GROWTH
Culture: NO GROWTH
SPECIAL REQUESTS: ADEQUATE
Special Requests: ADEQUATE

## 2017-05-04 NOTE — Telephone Encounter (Signed)
lmtcb x1 for pt. 

## 2017-05-04 NOTE — Telephone Encounter (Signed)
Pt is aware of upcoming apt and to have CXR prior to visit.  Pt voiced her understanding.  Nothing further is needed.

## 2017-05-09 ENCOUNTER — Ambulatory Visit (INDEPENDENT_AMBULATORY_CARE_PROVIDER_SITE_OTHER)
Admission: RE | Admit: 2017-05-09 | Discharge: 2017-05-09 | Disposition: A | Discharge status: Home / Self Care | Payer: Self-pay | Source: Ambulatory Visit | Attending: Pulmonary Disease | Admitting: Pulmonary Disease

## 2017-05-09 ENCOUNTER — Encounter: Payer: Self-pay | Admitting: Pulmonary Disease

## 2017-05-09 ENCOUNTER — Ambulatory Visit (INDEPENDENT_AMBULATORY_CARE_PROVIDER_SITE_OTHER): Payer: Self-pay | Admitting: Pulmonary Disease

## 2017-05-09 VITALS — BP 142/82 | HR 106 | Ht 64.0 in | Wt 253.0 lb

## 2017-05-09 DIAGNOSIS — J189 Pneumonia, unspecified organism: Secondary | ICD-10-CM

## 2017-05-09 DIAGNOSIS — R0602 Shortness of breath: Secondary | ICD-10-CM

## 2017-05-09 DIAGNOSIS — J181 Lobar pneumonia, unspecified organism: Secondary | ICD-10-CM

## 2017-05-09 MED ORDER — UMECLIDINIUM-VILANTEROL 62.5-25 MCG/INH IN AEPB: 1.0000 | INHALATION_SPRAY | Freq: Every day | RESPIRATORY_TRACT | 0 refills | Status: AC

## 2017-05-09 NOTE — Patient Instructions (Signed)
We will give samples of Anoro We will check pulmonary function test Chest x-ray looks improved Follow-up in 3 months with repeat chest x-ray.

## 2017-05-09 NOTE — Progress Notes (Signed)
Kristin Burnett    [id number]    [birth year]  Primary Care Physician:Patient, No Pcp Per  Referring Physician: No referring provider defined for this encounter.  Chief complaint:  Follow up after hospitalization for recurrent pneumonia  HPI: 52 year old with history of dysfunctional uterine bleeding, GERD, hypertension, active smoker presenting with recurrent pneumonia.  She was treated on 11/5 in the ED with Levaquin which improved her symptoms but recurred.  She was seen again on 12/8 when she got a Z-Pak.  She never got steroids.  She returns with cough, white mucus production for the past week with chills, weakness, decreased appetite.  She is an active smoker with 15-30-pack-year smoking history.  She was given Symbicort and albuterol by her outpatient physician but he uses this intermittently.  She does not have any significant exposures recently.  She reports mold in her apartment 2 years ago.  No joint pain, rash or morning stiffness. Underwent bronchoscopy on 05/01/17.  All cultures and path today been negative.  Pets: No pets at home, no birds or exposure to farm animals Occupation: Works in [employer] for a [employer]. Exposures: Exposed to mold 2 years ago.  No recent exposure.  No hot tubs, Jacuzzi Smoking history: 1/2 pack per day smoker.  15-30-pack-year smoking history Travel History: Not significant  Outpatient Encounter Medications as of 05/09/2017  Medication Sig  . amLODipine (NORVASC) 10 MG tablet Take 1 tablet (10 mg total) by mouth daily.  Marland Kitchen lisinopril (PRINIVIL,ZESTRIL) 20 MG tablet Take 1 tablet (20 mg total) by mouth daily.  . predniSONE (DELTASONE) 10 MG tablet Prednisone 41m po once daily for 3 days, then 357mpo once daily for 3 days, then 2068mo once daily for 3 days and then 51m9m once daily for 3 days and stop  . [DISCONTINUED] albuterol (PROVENTIL HFA) 108 (90 Base) MCG/ACT inhaler Inhale 2 puffs into the lungs every 6 (six) hours as needed  for wheezing or shortness of breath.  . [DISCONTINUED] budesonide-formoterol (SYMBICORT) 160-4.5 MCG/ACT inhaler Inhale 2 puffs into the lungs 2 (two) times daily. (Patient not taking: Reported on 05/09/2017)  . [DISCONTINUED] carvedilol (COREG) 12.5 MG tablet Take 1 tablet (12.5 mg total) by mouth 2 (two) times daily with a meal. (Patient not taking: Reported on 05/09/2017)  . [DISCONTINUED] ipratropium-albuterol (DUONEB) 0.5-2.5 (3) MG/3ML SOLN Take 3 mLs by nebulization every 6 (six) hours as needed. (Patient not taking: Reported on 05/09/2017)  . [DISCONTINUED] tiotropium (SPIRIVA HANDIHALER) 18 MCG inhalation capsule Place 1 capsule (18 mcg total) into inhaler and inhale daily. (Patient not taking: Reported on 05/09/2017)   No facility-administered encounter medications on file as of 05/09/2017.     Allergies as of 05/09/2017  . (No Known Allergies)    Past Medical History:  Diagnosis Date  . Bronchospasm 01/09/2013  . DUB (dysfunctional uterine bleeding)   . GERD (gastroesophageal reflux disease)    otc tums  . History of blood transfusion 2011   "related to fibrods"  . Hypertension   . Pneumonia ~ 2015; 04/29/2017  . Recurrent UTI, h/o urethra diverticuli 10/13/2009   Qualifier: Diagnosis of  By: KariStanford Scotland NodiJiles Garter. Urethral diverticulum    Dr. EskeEstill DoomsYeast vaginitis    Dr. EskeEstill DoomsPast Surgical History:  Procedure Laterality Date  . BILATERAL SALPINGECTOMY  03/07/2012   Procedure: BILATERAL SALPINGECTOMY;  Surgeon: CaroLavonia Drafts;  [facility];  Service: Gynecology;  Laterality: Bilateral;  . CYSTOSCOPY  03/07/2012   Procedure: CYSTOSCOPY;  Surgeon: Lavonia Drafts, MD;  Location: [facility];  Service: Gynecology;  Laterality: N/A;  . LASER ABLATION     "before they took my uterus out"  . ROBOTIC ASSISTED TOTAL HYSTERECTOMY  03/07/2012   Procedure: ROBOTIC ASSISTED TOTAL HYSTERECTOMY;  Surgeon: Lavonia Drafts, MD;  Location: [facility];   Service: Gynecology;  Laterality: N/A;  . TUBAL LIGATION    . VIDEO BRONCHOSCOPY Bilateral 05/01/2017   Procedure: VIDEO BRONCHOSCOPY WITH FLUORO;  Surgeon: Marshell Garfinkel, MD;  Location: [facility];  Service: Cardiopulmonary;  Laterality: Bilateral;    Family History  Problem Relation Age of Onset  . Hypertension Mother        [place]  . Arthritis Mother   . Anemia Mother   . Cancer Mother        uterine?  . Diabetes Father   . Stroke Father 61  . Heart disease Father        CHF, has a defib  . Heart disease Brother        CHF  . Cancer Other        MGM "blood cancer"  . Colon cancer Neg Hx   . Breast cancer Neg Hx     Social History   Socioeconomic History  . Marital status: Divorced    Spouse name: Not on file  . Number of children: 3  . Years of education: Not on file  . Highest education level: Not on file  Social Needs  . Financial resource strain: Not on file  . Food insecurity - worry: Not on file  . Food insecurity - inability: Not on file  . Transportation needs - medical: Not on file  . Transportation needs - non-medical: Not on file  Occupational History  . Occupation: bus Education administrator: [employer]  Tobacco Use  . Smoking status: Former Smoker    Packs/day: 0.50    Years: 36.00    Pack years: 18.00    Types: Cigarettes    Start date: 06/21/1986  . Smokeless tobacco: Never Used  . Tobacco comment: quit smoking 05/03/17  Substance and Sexual Activity  . Alcohol use: No    Alcohol/week: 0.0 oz  . Drug use: No  . Sexual activity: Not Currently    Birth control/protection: Surgical  Other Topics Concern  . Not on file  Social History Narrative   Household: pt and 2 children   Regular exercise: was; not been in 1 mth   Caffeine use: coffee daily    Review of systems: Review of Systems  Constitutional: Negative for fever and chills.  HENT: Negative.   Eyes: Negative for blurred vision.  Respiratory: as per HPI  Cardiovascular:  Negative for chest pain and palpitations.  Gastrointestinal: Negative for vomiting, diarrhea, blood per rectum. Genitourinary: Negative for dysuria, urgency, frequency and hematuria.  Musculoskeletal: Negative for myalgias, back pain and joint pain.  Skin: Negative for itching and rash.  Neurological: Negative for dizziness, tremors, focal weakness, seizures and loss of consciousness.  Endo/Heme/Allergies: Negative for environmental allergies.  Psychiatric/Behavioral: Negative for depression, suicidal ideas and hallucinations.  All other systems reviewed and are negative.  Physical Exam: Blood pressure (!) 142/82, pulse (!) 106, height _0  (1.626 m), weight 253 lb (114.8 kg), last menstrual period 02/29/2012, SpO2 97 %. Gen:      No acute distress HEENT:  EOMI, sclera anicteric Neck:     No masses; no  thyromegaly Lungs:    Clear to auscultation bilaterally; normal respiratory effort CV:         Regular rate and rhythm; no murmurs Abd:      + bowel sounds; soft, non-tender; no palpable masses, no distension Ext:    No edema; adequate peripheral perfusion Skin:      Warm and dry; no rash Neuro: alert and oriented x 3 Psych: normal mood and affect  Data Reviewed: Chest x-ray 03/08/17-patchy bilateral infiltrates Chest x-ray 03/31/17- persistent patchy bilateral infiltrates Chest x-ray 04/29/17- bilateral interstitial opacities. Chest x-ray 05/02/17- bilateral interstitial opacities. tinly apical pneumothorax Chest x-ray 05/09/17- improving basilar opacities.  No pneumothorax.  CT chest 04/29/17-bilateral airspace opacities, scattered bilateral pulmonary nodules.  Irregular contour in the right kidney. I have reviewed the images personally.  Bronchoscopy 05/01/17- Microbiology-negative BAL cell count WBC 178, eosinophils 6%, neutrophils 36%, monocyte macrophage 43% Brushing, transbronchial biopsy- benign lung tissue, no malignancy. Pneumocystis DFA-negative CD4 CD8 ratio-5:1  Serologies  04/30/17 ANA positive CCP 6, double-stranded DNA 2, rheumatoid factor 18.9 Quantitative immunoglobulins- normal\  HIV 04/30/17- negative  Blood cultures 04/29/17- Neg Sputum Cx 04/30/17- GNR, GPC Respiratory virus panel, flu PCR 04/29/17-negative Urine Strep pneumo, legionella 04/30/17- negative  Assessment:  Recurrent pneumonia 52 year old with recurrent pneumonia and bilateral infiltrate. This is her third episode of the past 2 months after appropriate antibiotic therapy. May have non-infectious causes including eosinophilic pneumonia, organizing pneumonia, sarcoid but bronchoscopy was unrevealing There is no clinical evidence of recurrent aspiration. CTD serologies noted for positive ANA but there no clinical evidence of connective tissue disease. Will need close monitoring in case she develops symptoms.   Active smoker, Suspected COPD exacerbation Discharged on symbicort 160/4.5 but she cannot afford the meds Give samples of anoro S/p pred taper Would like to avoid cost of full PFTs. Check spirometry Follow up CT chest to monitor lung nodules.   Plan/Recommendations: - Anoro samples, spirometry - Follow up with repeat CT scan  Marshell Garfinkel MD [facility] Pager [phone] 05/09/2017, 12:20 PM  CC: No ref. provider found

## 2017-05-17 ENCOUNTER — Ambulatory Visit: Payer: Self-pay | Admitting: Family Medicine

## 2017-05-30 ENCOUNTER — Ambulatory Visit (INDEPENDENT_AMBULATORY_CARE_PROVIDER_SITE_OTHER)
Admission: RE | Admit: 2017-05-30 | Discharge: 2017-05-30 | Disposition: A | Discharge status: Home / Self Care | Payer: Self-pay | Source: Ambulatory Visit | Attending: Pulmonary Disease | Admitting: Pulmonary Disease

## 2017-05-30 ENCOUNTER — Ambulatory Visit (INDEPENDENT_AMBULATORY_CARE_PROVIDER_SITE_OTHER): Payer: Self-pay | Admitting: Pulmonary Disease

## 2017-05-30 ENCOUNTER — Other Ambulatory Visit (INDEPENDENT_AMBULATORY_CARE_PROVIDER_SITE_OTHER): Payer: Self-pay

## 2017-05-30 ENCOUNTER — Encounter: Payer: Self-pay | Admitting: Pulmonary Disease

## 2017-05-30 VITALS — BP 138/78 | HR 122 | Ht 64.0 in | Wt 247.0 lb

## 2017-05-30 DIAGNOSIS — J189 Pneumonia, unspecified organism: Secondary | ICD-10-CM

## 2017-05-30 LAB — CBC WITH DIFFERENTIAL/PLATELET
BASOS ABS: 0.1 10*3/uL (ref 0.0–0.1)
Basophils Relative: 1.2 % (ref 0.0–3.0)
EOS PCT: 3 % (ref 0.0–5.0)
Eosinophils Absolute: 0.3 10*3/uL (ref 0.0–0.7)
HEMATOCRIT: 34.8 % — AB (ref 36.0–46.0)
HEMOGLOBIN: 11.4 g/dL — AB (ref 12.0–15.0)
LYMPHS PCT: 18.7 % (ref 12.0–46.0)
Lymphs Abs: 1.8 10*3/uL (ref 0.7–4.0)
MCHC: 32.8 g/dL (ref 30.0–36.0)
MCV: 83.2 fl (ref 78.0–100.0)
MONOS PCT: 8.3 % (ref 3.0–12.0)
Monocytes Absolute: 0.8 10*3/uL (ref 0.1–1.0)
NEUTROS PCT: 68.8 % (ref 43.0–77.0)
Neutro Abs: 6.8 10*3/uL (ref 1.4–7.7)
Platelets: 412 10*3/uL — ABNORMAL HIGH (ref 150.0–400.0)
RBC: 4.18 Mil/uL (ref 3.87–5.11)
RDW: 15.2 % (ref 11.5–15.5)
WBC: 9.8 10*3/uL (ref 4.0–10.5)

## 2017-05-30 MED ORDER — PREDNISONE 10 MG PO TABS: ORAL_TABLET | ORAL | 0 refills | Status: DC

## 2017-05-30 NOTE — Addendum Note (Signed)
Addended by: Amado Coe on: 05/30/2017 10:59 AM   Modules accepted: Orders

## 2017-05-30 NOTE — Progress Notes (Signed)
   Subjective:    Patient ID: Kristin Burnett, female    DOB: [birth year], 52 y.o.   MRN: [id number]  HPI  52 year old smoker with recurrent pneumonia Chief Complaint  Patient presents with  . Acute Visit    pt reports of prod cough with white mucus, chest heaviness & occ chills x1w   She was last treated with antibiotics and prednisone in January. Workup as below including bronchoscopy has been negative, inflammatory markers negative except for positive ANA. She now reports mostly dry cough of insidious onset, minimal white mucus, no fevers, and left-sided chest pain radiating to her shoulder for about 1 week, she is worried that her pneumonia will be coming on again  She continues to smoke about half pack per day  Significant tests/ events reviewed  Chest x-ray 03/08/17-patchy bilateral infiltrates Chest x-ray 03/31/17- persistent patchy bilateral infiltrates Chest x-ray 04/29/17- bilateral interstitial opacities. Chest x-ray 05/02/17- bilateral interstitial opacities.tinly apical pneumothorax Chest x-ray 05/09/17- improving basilar opacities.  No pneumothorax.  CT chest 04/29/17-bilateral airspace opacities, scattered bilateral pulmonary nodules. Irregular contour in the right kidney.   Bronchoscopy 05/01/17- Microbiology-negative BAL cell count WBC 178, eosinophils 6%, neutrophils 36%, monocyte macrophage 43% Brushing, transbronchial biopsy- benign lung tissue, no malignancy. Pneumocystis DFA-negative CD4 CD8 ratio-5:1  Serologies 04/30/17 ANA positive CCP 6, double-stranded DNA 2, rheumatoid factor 18.9 Quantitative immunoglobulins- normal\  HIV 04/30/17- negative   Sputum Cx 04/30/17- GNR, GPC Respiratory virus panel, flu PCR 04/29/17-negative Urine Strep pneumo, legionella 04/30/17- negative   Review of Systems Patient denies significant dyspnea,cough, hemoptysis,  chest pain, palpitations, pedal edema, orthopnea, paroxysmal nocturnal dyspnea, lightheadedness, nausea,  vomiting, abdominal or  leg pains      Objective:   Physical Exam  Gen. Pleasant, obese, in no distress ENT - no lesions, no post nasal drip Neck: No JVD, no thyromegaly, no carotid bruits Lungs: no use of accessory muscles, no dullness to percussion, left basal rales ,no rhonchi  Cardiovascular: Rhythm regular, heart sounds  normal, no murmurs or gallops, no peripheral edema Musculoskeletal: No deformities, no cyanosis or clubbing , no tremors       Assessment & Plan:

## 2017-05-30 NOTE — Patient Instructions (Signed)
Blood work today Prednisone 10 mg tabs  Take 2 tabs daily with food x 5ds, then 1 tab daily with food x 5ds then STOP   You really have to stop smoking Call for antibiotic if he developed fever or yellow-green sputum

## 2017-05-30 NOTE — Assessment & Plan Note (Signed)
Not convinced this is an infectious process, inflammatory markers have also been negative, smoking can certainly cause an ILD such as DIP/RBI LD however this appears to be more lobar process physicians 1 smoking cessation was again emphasized today   CBC with diff today Prednisone 10 mg tabs  Take 2 tabs daily with food x 5ds, then 1 tab daily with food x 5ds then STOP   Low threshold for antibiotic if WBC high or if she  developed fever or yellow-green sputum

## 2017-05-31 LAB — FUNGUS CULTURE WITH STAIN

## 2017-05-31 LAB — FUNGAL ORGANISM REFLEX

## 2017-05-31 LAB — FUNGUS CULTURE RESULT

## 2017-05-31 NOTE — Progress Notes (Signed)
Spoke with pt and notified of results per Dr. Wert. Pt verbalized understanding and denied any questions. 

## 2017-06-13 LAB — ACID FAST CULTURE WITH REFLEXED SENSITIVITIES: ACID FAST CULTURE - AFSCU3: NEGATIVE

## 2017-06-20 ENCOUNTER — Ambulatory Visit: Payer: Self-pay | Admitting: Pulmonary Disease

## 2017-08-15 ENCOUNTER — Ambulatory Visit: Payer: Self-pay | Admitting: Pulmonary Disease

## 2018-04-08 ENCOUNTER — Other Ambulatory Visit: Payer: Self-pay

## 2018-04-08 ENCOUNTER — Ambulatory Visit (HOSPITAL_COMMUNITY)
Admission: EM | Admit: 2018-04-08 | Discharge: 2018-04-08 | Disposition: A | Discharge status: Home / Self Care | Payer: BC Managed Care – PPO | Attending: Urgent Care | Admitting: Urgent Care

## 2018-04-08 ENCOUNTER — Encounter (HOSPITAL_COMMUNITY): Payer: Self-pay | Admitting: *Deleted

## 2018-04-08 DIAGNOSIS — I10 Essential (primary) hypertension: Secondary | ICD-10-CM | POA: Insufficient documentation

## 2018-04-08 DIAGNOSIS — Z720 Tobacco use: Secondary | ICD-10-CM | POA: Insufficient documentation

## 2018-04-08 DIAGNOSIS — J0100 Acute maxillary sinusitis, unspecified: Secondary | ICD-10-CM | POA: Diagnosis not present

## 2018-04-08 MED ORDER — LISINOPRIL 20 MG PO TABS: 20.0000 mg | ORAL_TABLET | Freq: Every day | ORAL | 0 refills | Status: DC

## 2018-04-08 MED ORDER — PROMETHAZINE-DM 6.25-15 MG/5ML PO SYRP: 5.0000 mL | ORAL_SOLUTION | Freq: Every evening | ORAL | 0 refills | Status: DC | PRN

## 2018-04-08 MED ORDER — BENZONATATE 100 MG PO CAPS: 100.0000 mg | ORAL_CAPSULE | Freq: Three times a day (TID) | ORAL | 0 refills | Status: DC | PRN

## 2018-04-08 MED ORDER — AMLODIPINE BESYLATE 10 MG PO TABS: 10.0000 mg | ORAL_TABLET | Freq: Every day | ORAL | 0 refills | Status: DC

## 2018-04-08 MED ORDER — AMOXICILLIN-POT CLAVULANATE 875-125 MG PO TABS: 1.0000 | ORAL_TABLET | Freq: Two times a day (BID) | ORAL | 0 refills | Status: DC

## 2018-04-08 NOTE — Discharge Instructions (Signed)
You may take 500mg  Tylenol every 6 hours for pain and inflammation. Hydrate very well with at least 2 liters of water. Eat light meals such as soups to replenish electrolytes and get good nutrition.

## 2018-04-08 NOTE — ED Provider Notes (Signed)
MRN: [id number] DOB: [birth year]  Subjective:   Kristin Burnett is a 52 y.o. female presenting for 2 week history of sinus pressure, sinus congestion, post-nasal drainage, felt significantly worse today. Has also had a dry cough. Had pneumonia in November 2018 and wants to make sure she does not have this again. She had extensive treatment for this. Patient has a history of HTN, takes lisinopril and amlodipine but has not been taking her medication for the past 2-3 days. Smokes 1ppd.  Denies fever, confusion, dizziness, ear pain, ear drainage, chest pain, nausea, vomiting, belly pain, hematuria, dysuria, rashes.  No current facility-administered medications for this encounter.   Current Outpatient Medications:  .  amLODipine (NORVASC) 10 MG tablet, Take 1 tablet (10 mg total) by mouth daily., Disp: 30 tablet, Rfl: 0 .  lisinopril (PRINIVIL,ZESTRIL) 20 MG tablet, Take 1 tablet (20 mg total) by mouth daily., Disp: 30 tablet, Rfl: 11 .  predniSONE (DELTASONE) 10 MG tablet, Take 2 tablets daily with food x 5 days, then take 1 tablet daily with food x5 days then STOP, Disp: 15 tablet, Rfl: 0    No Known Allergies   Past Medical History:  Diagnosis Date  . Bronchospasm 01/09/2013  . DUB (dysfunctional uterine bleeding)   . GERD (gastroesophageal reflux disease)    otc tums  . History of blood transfusion 2011   "related to fibrods"  . Hypertension   . Pneumonia ~ 2015; 04/29/2017  . Recurrent UTI, h/o urethra diverticuli 10/13/2009   Qualifier: Diagnosis of  By: Stanford Scotland MD, Jiles Garter    . Urethral diverticulum    Dr. Estill Dooms  . Yeast vaginitis    Dr. Estill Dooms     Past Surgical History:  Procedure Laterality Date  . BILATERAL SALPINGECTOMY  03/07/2012   Procedure: BILATERAL SALPINGECTOMY;  Surgeon: Lavonia Drafts, MD;  Location: [facility];  Service: Gynecology;  Laterality: Bilateral;  . CYSTOSCOPY  03/07/2012   Procedure: CYSTOSCOPY;  Surgeon: Lavonia Drafts, MD;  Location: [facility];   Service: Gynecology;  Laterality: N/A;  . LASER ABLATION     "before they took my uterus out"  . ROBOTIC ASSISTED TOTAL HYSTERECTOMY  03/07/2012   Procedure: ROBOTIC ASSISTED TOTAL HYSTERECTOMY;  Surgeon: Lavonia Drafts, MD;  Location: [facility];  Service: Gynecology;  Laterality: N/A;  . TUBAL LIGATION    . VIDEO BRONCHOSCOPY Bilateral 05/01/2017   Procedure: VIDEO BRONCHOSCOPY WITH FLUORO;  Surgeon: Marshell Garfinkel, MD;  Location: [facility];  Service: Cardiopulmonary;  Laterality: Bilateral;    Objective:   Vitals: BP (!) 179/111 (BP Location: Right Arm)   Pulse 80   Temp 97.7 F (36.5 C) (Oral)   Resp 18   LMP 02/29/2012 (Exact Date)   SpO2 98%   Physical Exam Constitutional:      Appearance: She is well-developed.  HENT:     Head:     Comments: TMs with bilateral effusion.  Bilateral maxillary sinus tenderness with associated erythema of nasal mucosa.  Throat with significant postnasal drainage but no exudates or tonsillar erythema. Eyes:     General: No scleral icterus.       Right eye: No discharge.        Left eye: No discharge.     Extraocular Movements: Extraocular movements intact.     Pupils: Pupils are equal, round, and reactive to light.  Neck:     Musculoskeletal: Normal range of motion and neck supple. No neck rigidity or muscular tenderness.  Cardiovascular:     Rate and Rhythm:  Normal rate and regular rhythm.     Heart sounds: No murmur. No friction rub. No gallop.   Pulmonary:     Effort: No respiratory distress.     Breath sounds: No wheezing or rales.  Abdominal:     General: Bowel sounds are normal. There is no distension.     Palpations: Abdomen is soft. There is no mass.     Tenderness: There is no abdominal tenderness.  Musculoskeletal:        General: No swelling.  Lymphadenopathy:     Cervical: No cervical adenopathy.  Skin:    General: Skin is warm and dry.     Coloration: Skin is not pale.     Findings: No erythema or rash.   Neurological:     General: No focal deficit present.     Mental Status: She is alert and oriented to person, place, and time.     Cranial Nerves: No cranial nerve deficit.     Motor: No weakness.     Coordination: Coordination normal.     Deep Tendon Reflexes: Reflexes normal.     Comments: Negative Romberg and pronator drift.  Psychiatric:        Mood and Affect: Mood normal.        Behavior: Behavior normal.        Thought Content: Thought content normal.    Assessment and Plan :   Acute non-recurrent maxillary sinusitis  Essential hypertension  Tobacco user  We will treat with Augmentin for maxillary sinusitis.  Patient and I agreed to hold off on chest x-ray today given that her lung sounds were normal.  Recommended supportive care otherwise.  Hold off on smoking as much as possible.  ER and return to clinic precautions reviewed. Counseled patient on potential for adverse effects with medications prescribed today, patient verbalized understanding.  I did emphasize need for patient to be compliant with her blood pressure medications.  I refilled her medicine which she is not out of today, simply just is not taking her medication.   Jaynee Eagles, PA-C 04/08/18 1130

## 2018-04-08 NOTE — ED Triage Notes (Addendum)
C/o generalized bodyaches onset several weeks ago states " I just feel bad today" States she hasn't taken her blood medication in 2 days.

## 2018-04-23 IMAGING — US US ABDOMEN LIMITED
1 series · 14 of 20 positions shown · non-contrast
Comparison: None.

CLINICAL DATA: Elevated liver enzymes

EXAM:
ULTRASOUND ABDOMEN LIMITED RIGHT UPPER QUADRANT

[Series 1: us abdomen limited · 0.26mm/px · 14 of 20 slices shown]
[im 1/20]
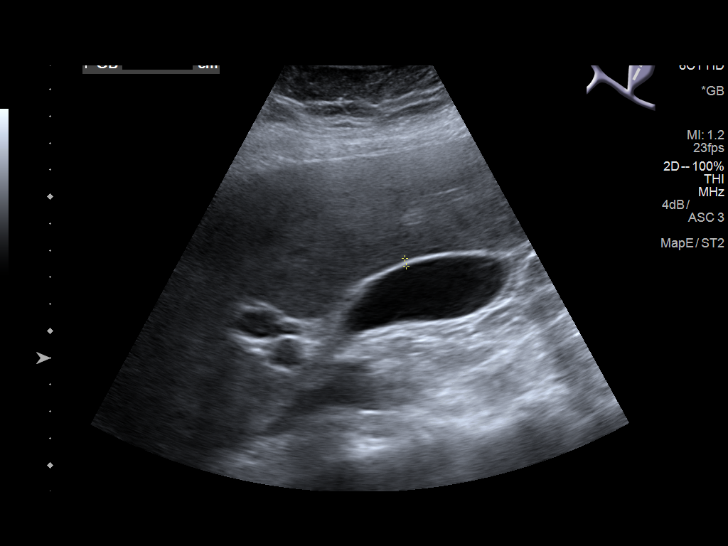
[im 3/20]
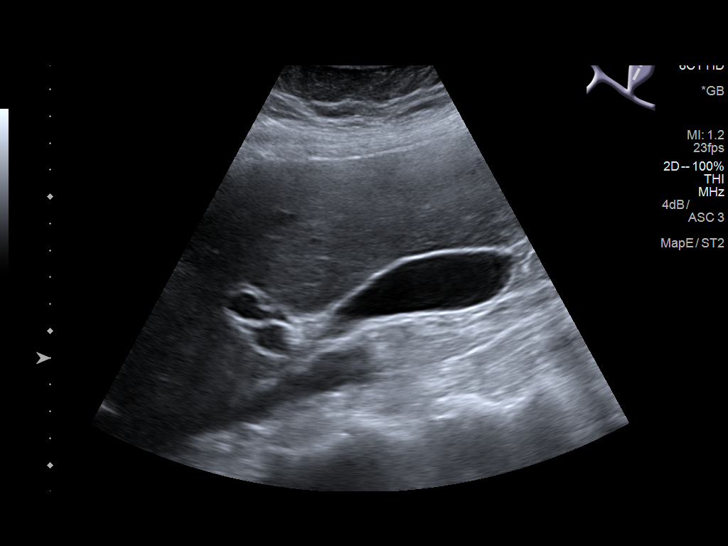
[im 4/20]
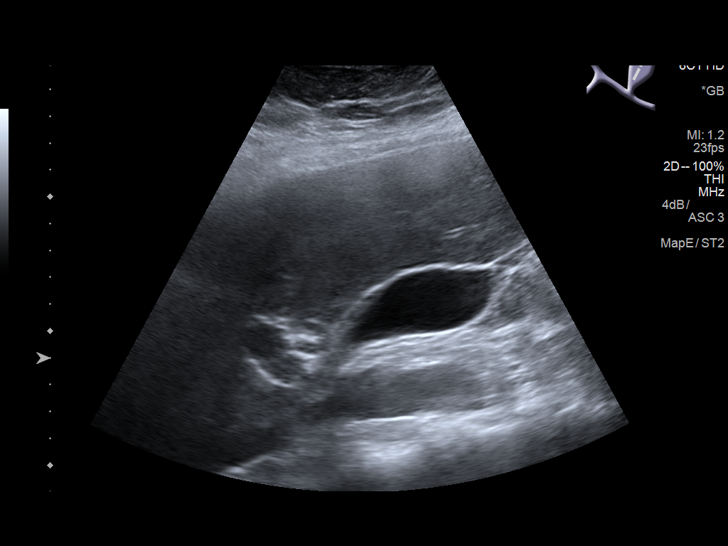
[im 6/20]
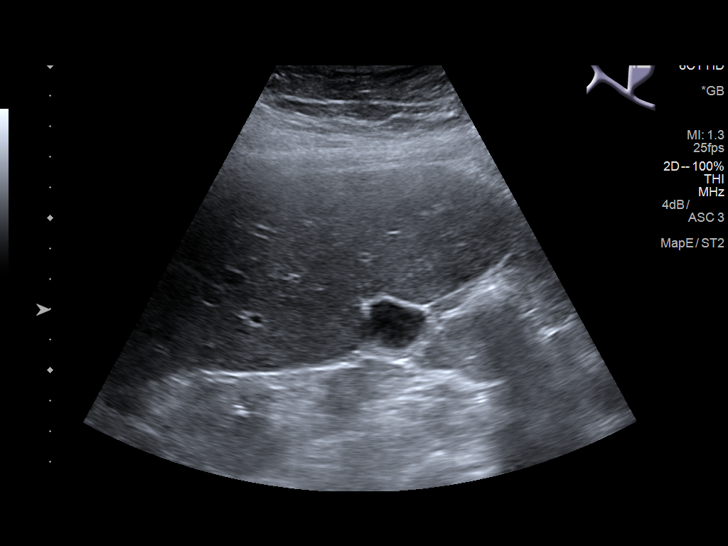
[im 7/20]
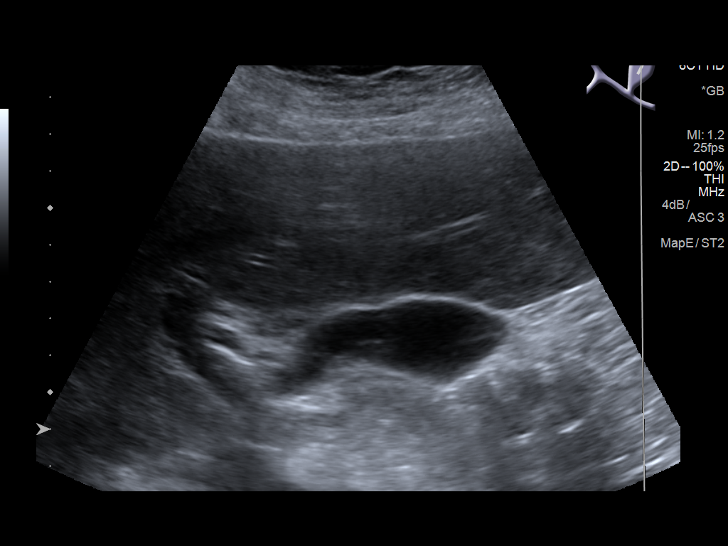
[im 8/20]
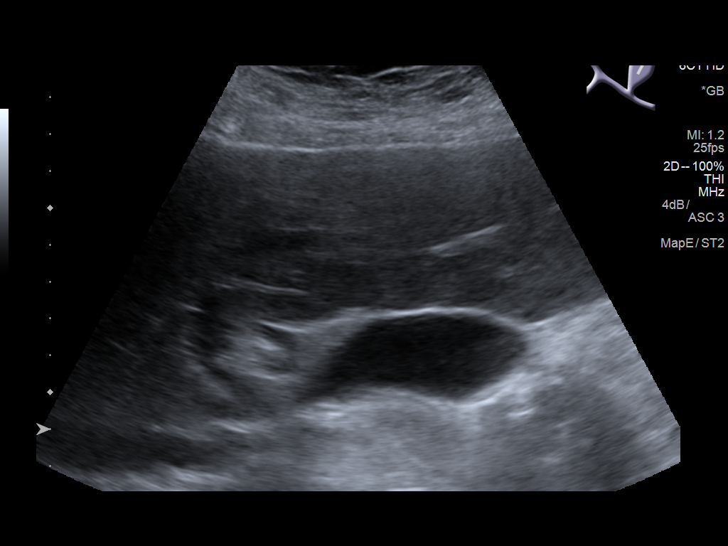
[im 10/20]
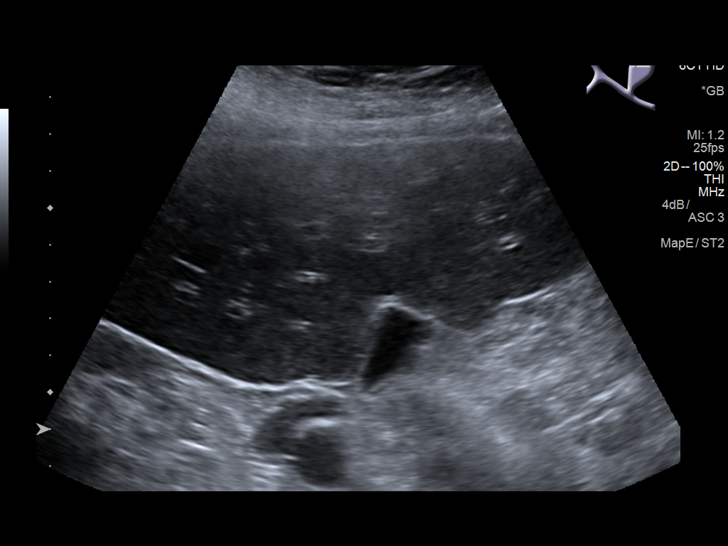
[im 11/20]
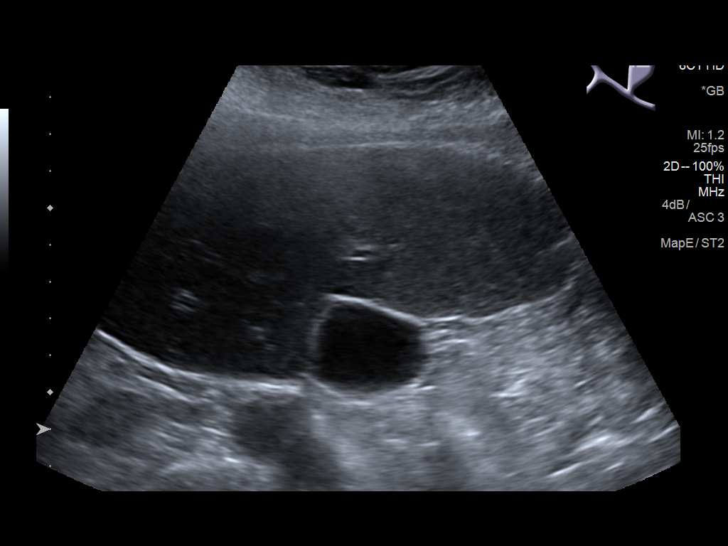
[im 13/20]
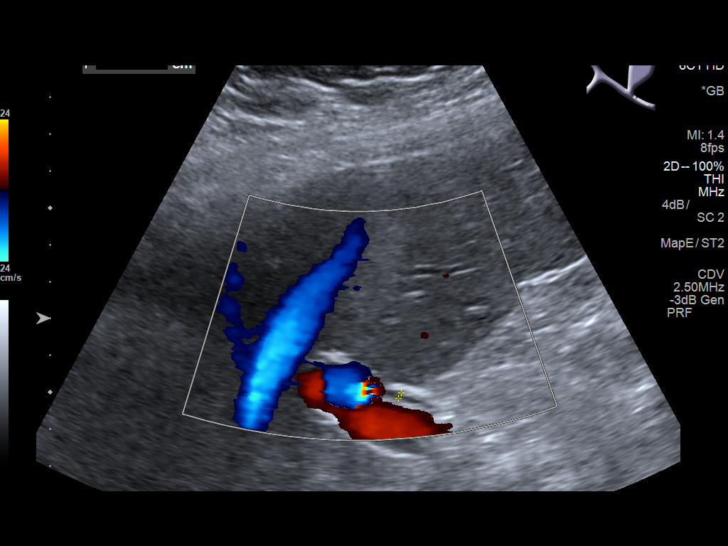
[im 14/20]
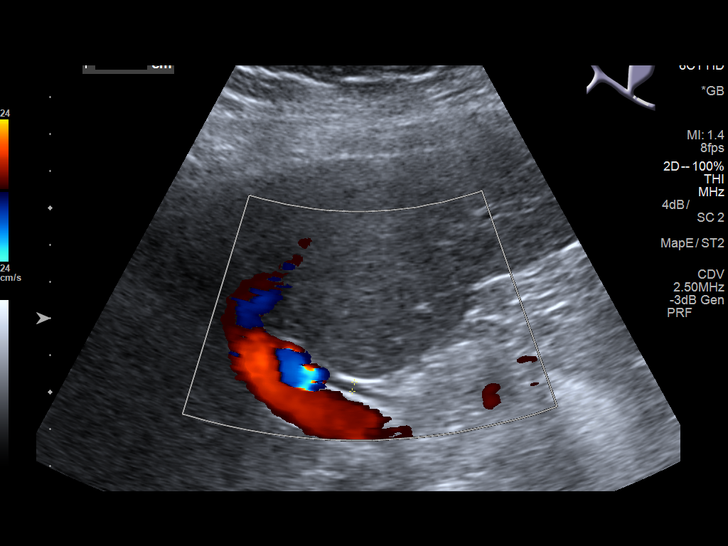
[im 16/20]
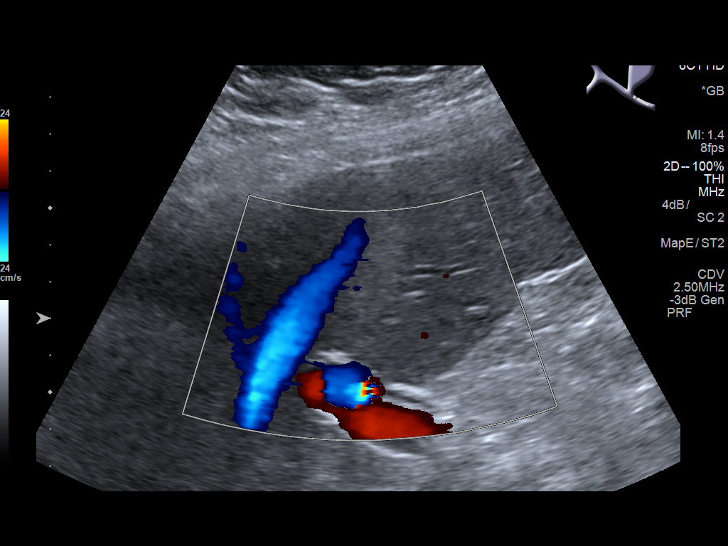
[im 17/20]
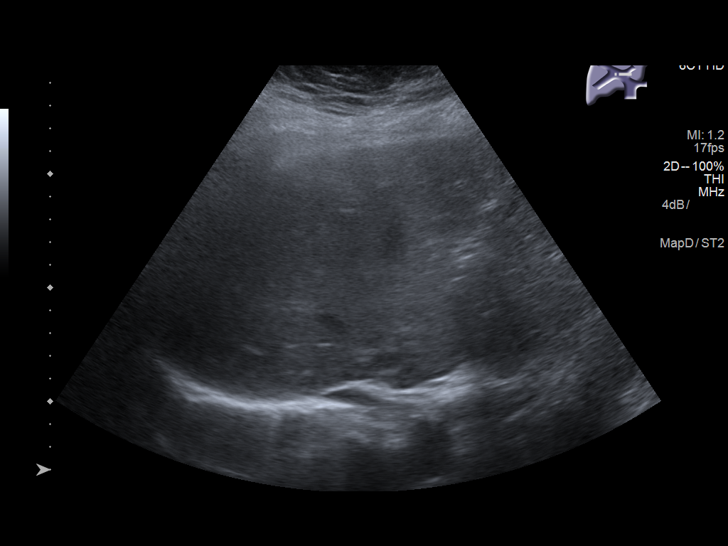
[im 18/20]
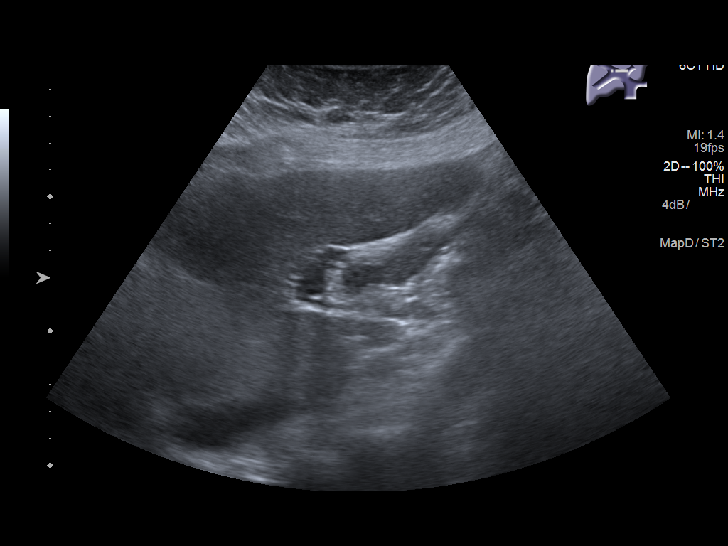
[im 20/20]
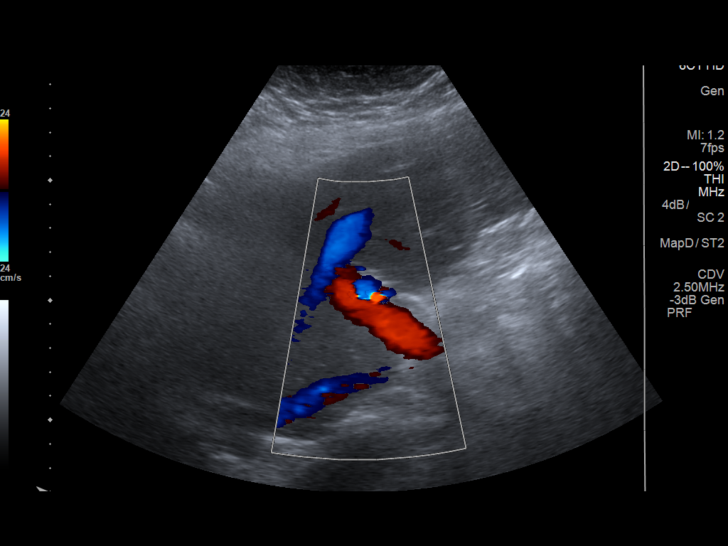

[14 of 20 positions shown; findings below may reference images not displayed]

FINDINGS: Gallbladder:

No gallstones or wall thickening visualized. There is no
pericholecystic fluid. No sonographic Murphy sign noted by
sonographer.

Common bile duct:

Diameter: 3 mm. No intrahepatic or extrahepatic biliary duct
dilatation.

Liver:

Liver measures 20.7 cm in length. No focal lesion identified. Within
normal limits in parenchymal echogenicity. Portal vein is patent on
color Doppler imaging with normal direction of blood flow towards
the liver.

There is a small right pleural effusion.
IMPRESSION: Prominent liver without focal liver lesion evident. No gallbladder
or biliary duct pathology.

Small right pleural effusion noted.

## 2018-06-01 IMAGING — CT CT CHEST W/O CM
2 of 4 series · 15 of 36 positions shown, 18 images · non-contrast
Comparison: None.

CLINICAL DATA: Cough with persistent shortness of breath.

EXAM:
CT CHEST WITHOUT CONTRAST
TECHNIQUE: Multidetector CT imaging of the chest was performed following the
standard protocol without IV contrast.

[Series 3: chest w/o 2mm st · axial · non-contrast · 0.82mm/px · z∈[+1084,+1308]mm · 12 of 132 slices shown, 15 images]
[im 10/132  mediastinal]
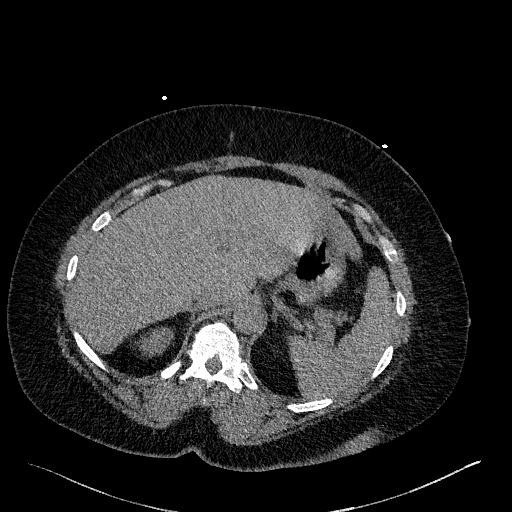
[im 10/132  lung]
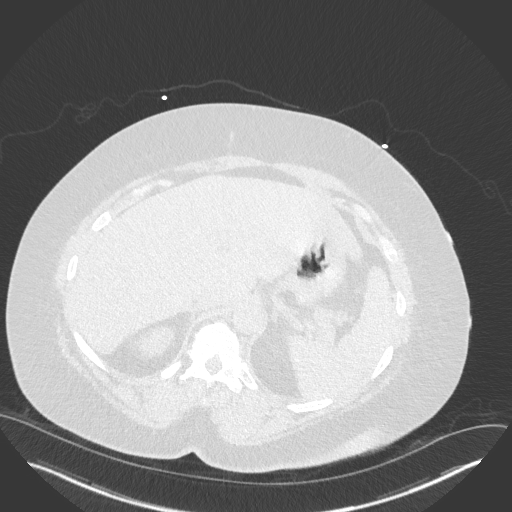
[im 19/132  lung]
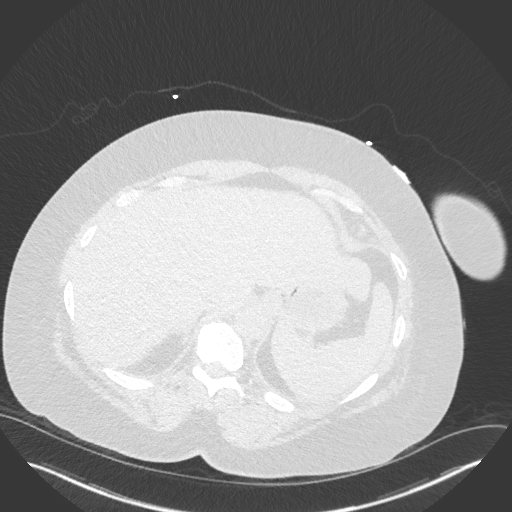
[im 29/132  lung]
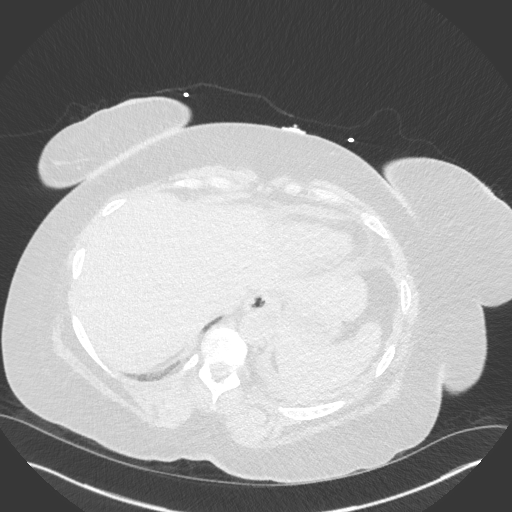
[im 38/132  lung]
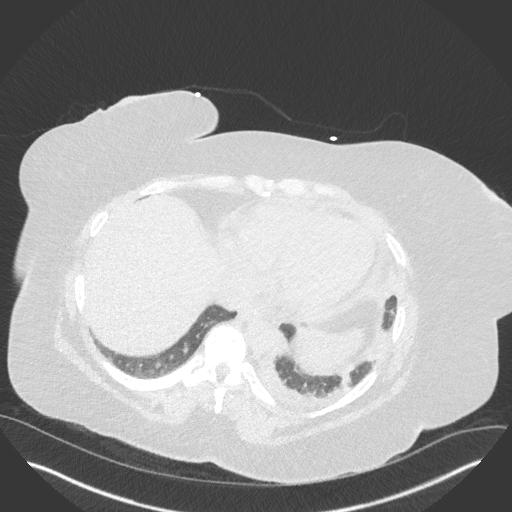
[im 47/132  mediastinal]
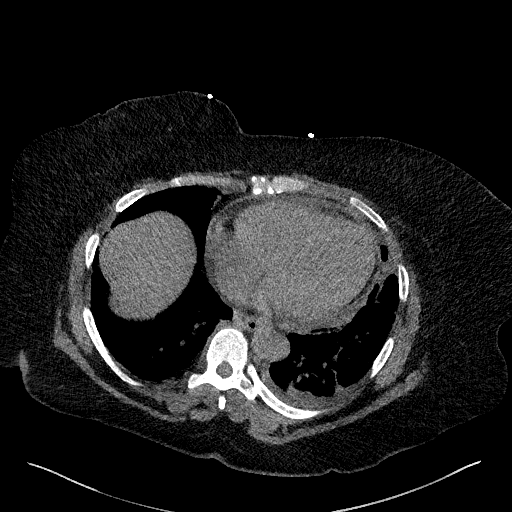
[im 47/132  lung]
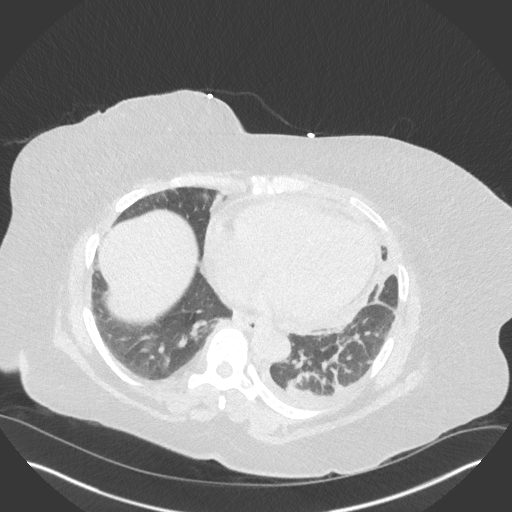
[im 57/132  lung]
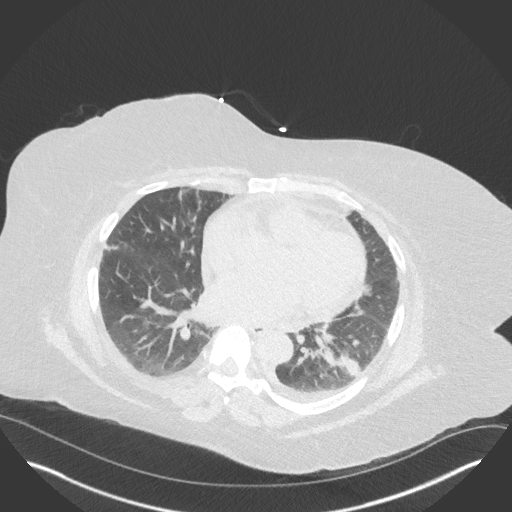
[im 75/132  lung]
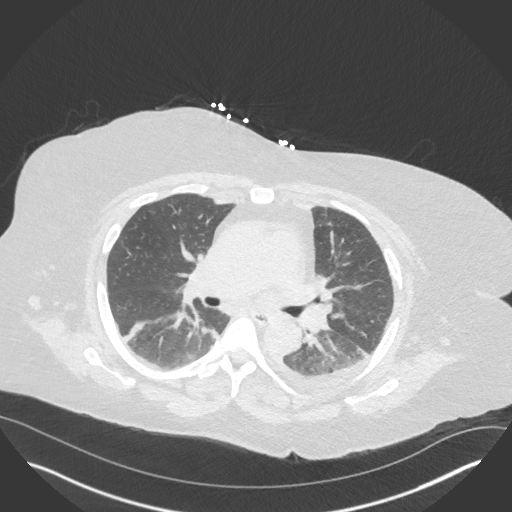
[im 85/132  lung]
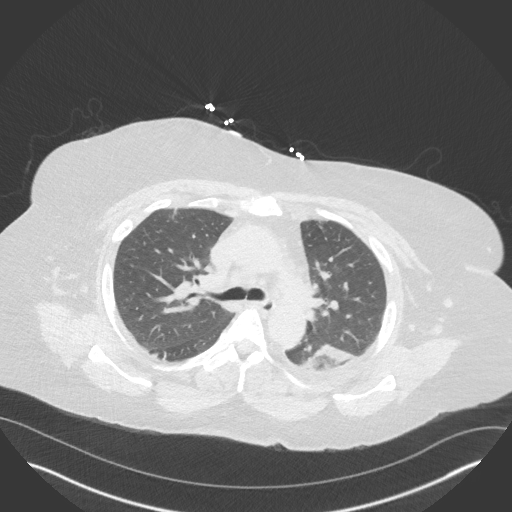
[im 94/132  mediastinal]
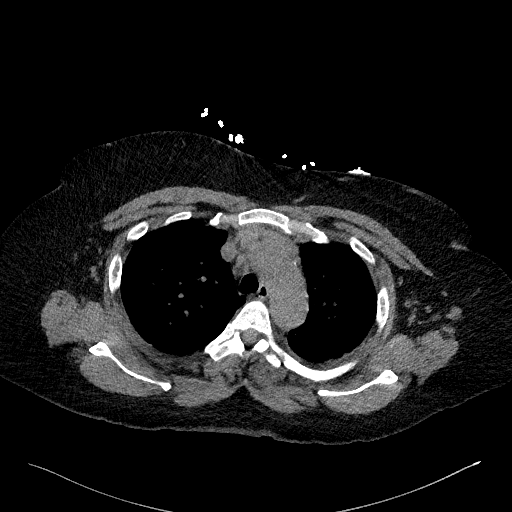
[im 94/132  lung]
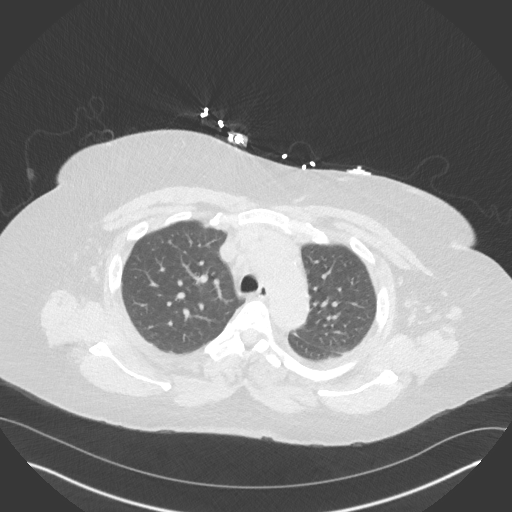
[im 103/132  lung]
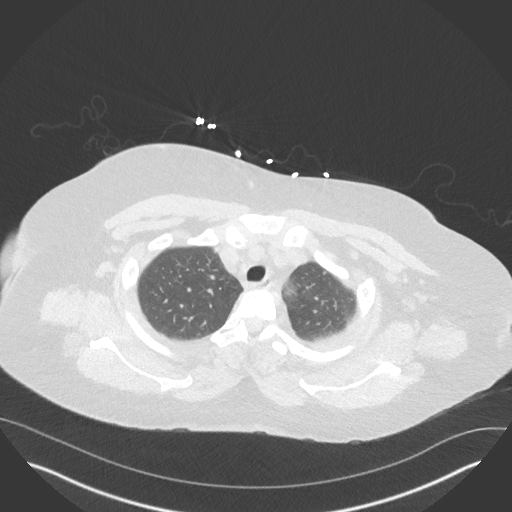
[im 113/132  lung]
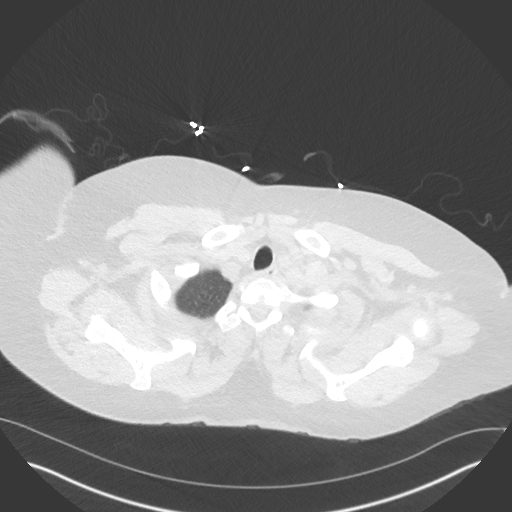
[im 122/132  lung]
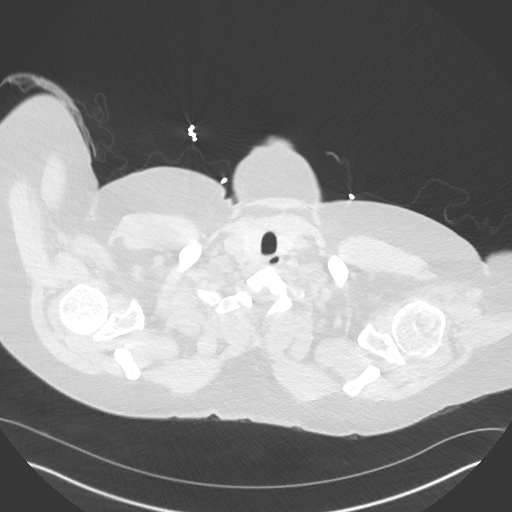

[Series 5: chest w/o 3mm st cor · coronal · non-contrast · 0.59mm/px · 3 of 101 slices shown]
[im 21/101  lung]
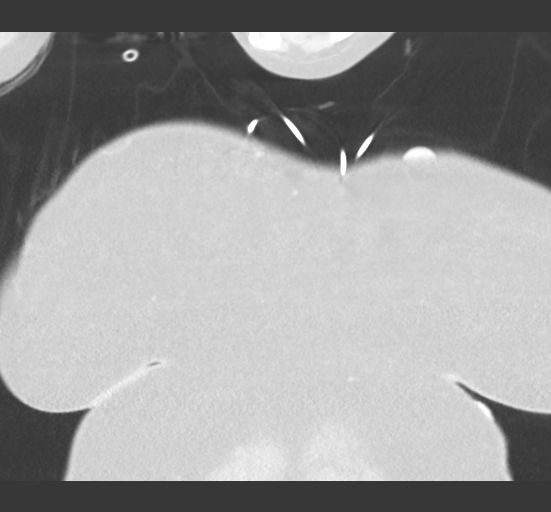
[im 41/101  lung]
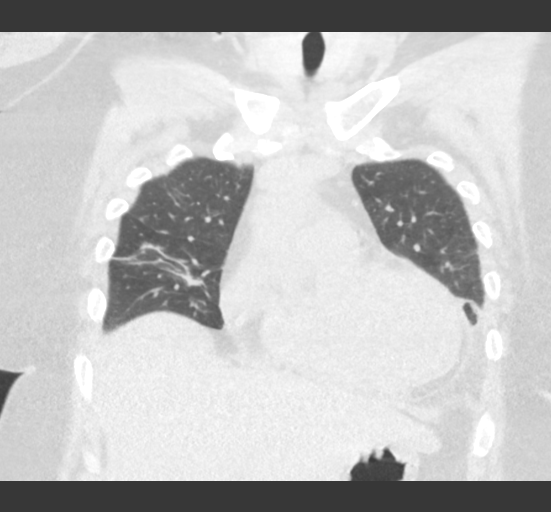
[im 61/101  lung]
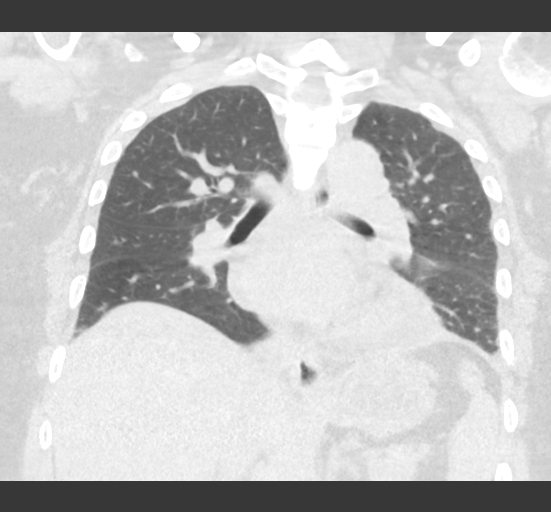

[15 of 36 positions shown; findings below may reference images not displayed]

FINDINGS: Cardiovascular: Heart is enlarged. Trace pericardial effusion noted.
Coronary artery calcification is evident.

Mediastinum/Nodes: No mediastinal lymphadenopathy. No evidence for
gross hilar lymphadenopathy although assessment is limited by the
lack of intravenous contrast on today's study. The esophagus has
normal imaging features. Small subpectoral and axillary lymph nodes
are seen bilaterally.

Lungs/Pleura: Areas of atelectasis and ill-defined ground-glass
attenuation are noted bilaterally with scattered areas of focal
airspace consolidation. Areas in both suggests a "reverse halo sign"
. Scattered tiny ill-defined pulmonary nodules are evident
bilaterally. Tiny left pleural effusion noted.

Upper Abdomen: Liver is prominent but incompletely visualized.
Irregular contour right upper pole kidney may be scarring although
mass lesion not excluded.

Musculoskeletal: Bone windows reveal no worrisome lytic or sclerotic
osseous lesions.
IMPRESSION: 1. Bilateral airspace opacities, some of which have a a more dense
rim and central ground-glass attenuation. Organizing pneumonia of
infectious/inflammatory etiology would be a consideration.
2. Scattered small bilateral pulmonary nodules, potentially
postinfectious/postinflammatory. Metastatic disease cannot be
excluded. Follow-up is recommended to ensure resolution/stability.
Repeat CT chest in 3 months recommended.
3. Irregular contour upper pole right kidney. This may be related to
cortical scarring but renal mass is a concern. Follow-up CT or MRI
of the abdomen recommended to further evaluate.

## 2018-06-03 IMAGING — DX DG CHEST 2V
2 series · 2 of 2 positions shown · non-contrast
Comparison: Earlier same day.

CLINICAL DATA: Evaluate for pneumothorax post bronchoscopy.

EXAM:
CHEST  2 VIEW

[w chest pa]
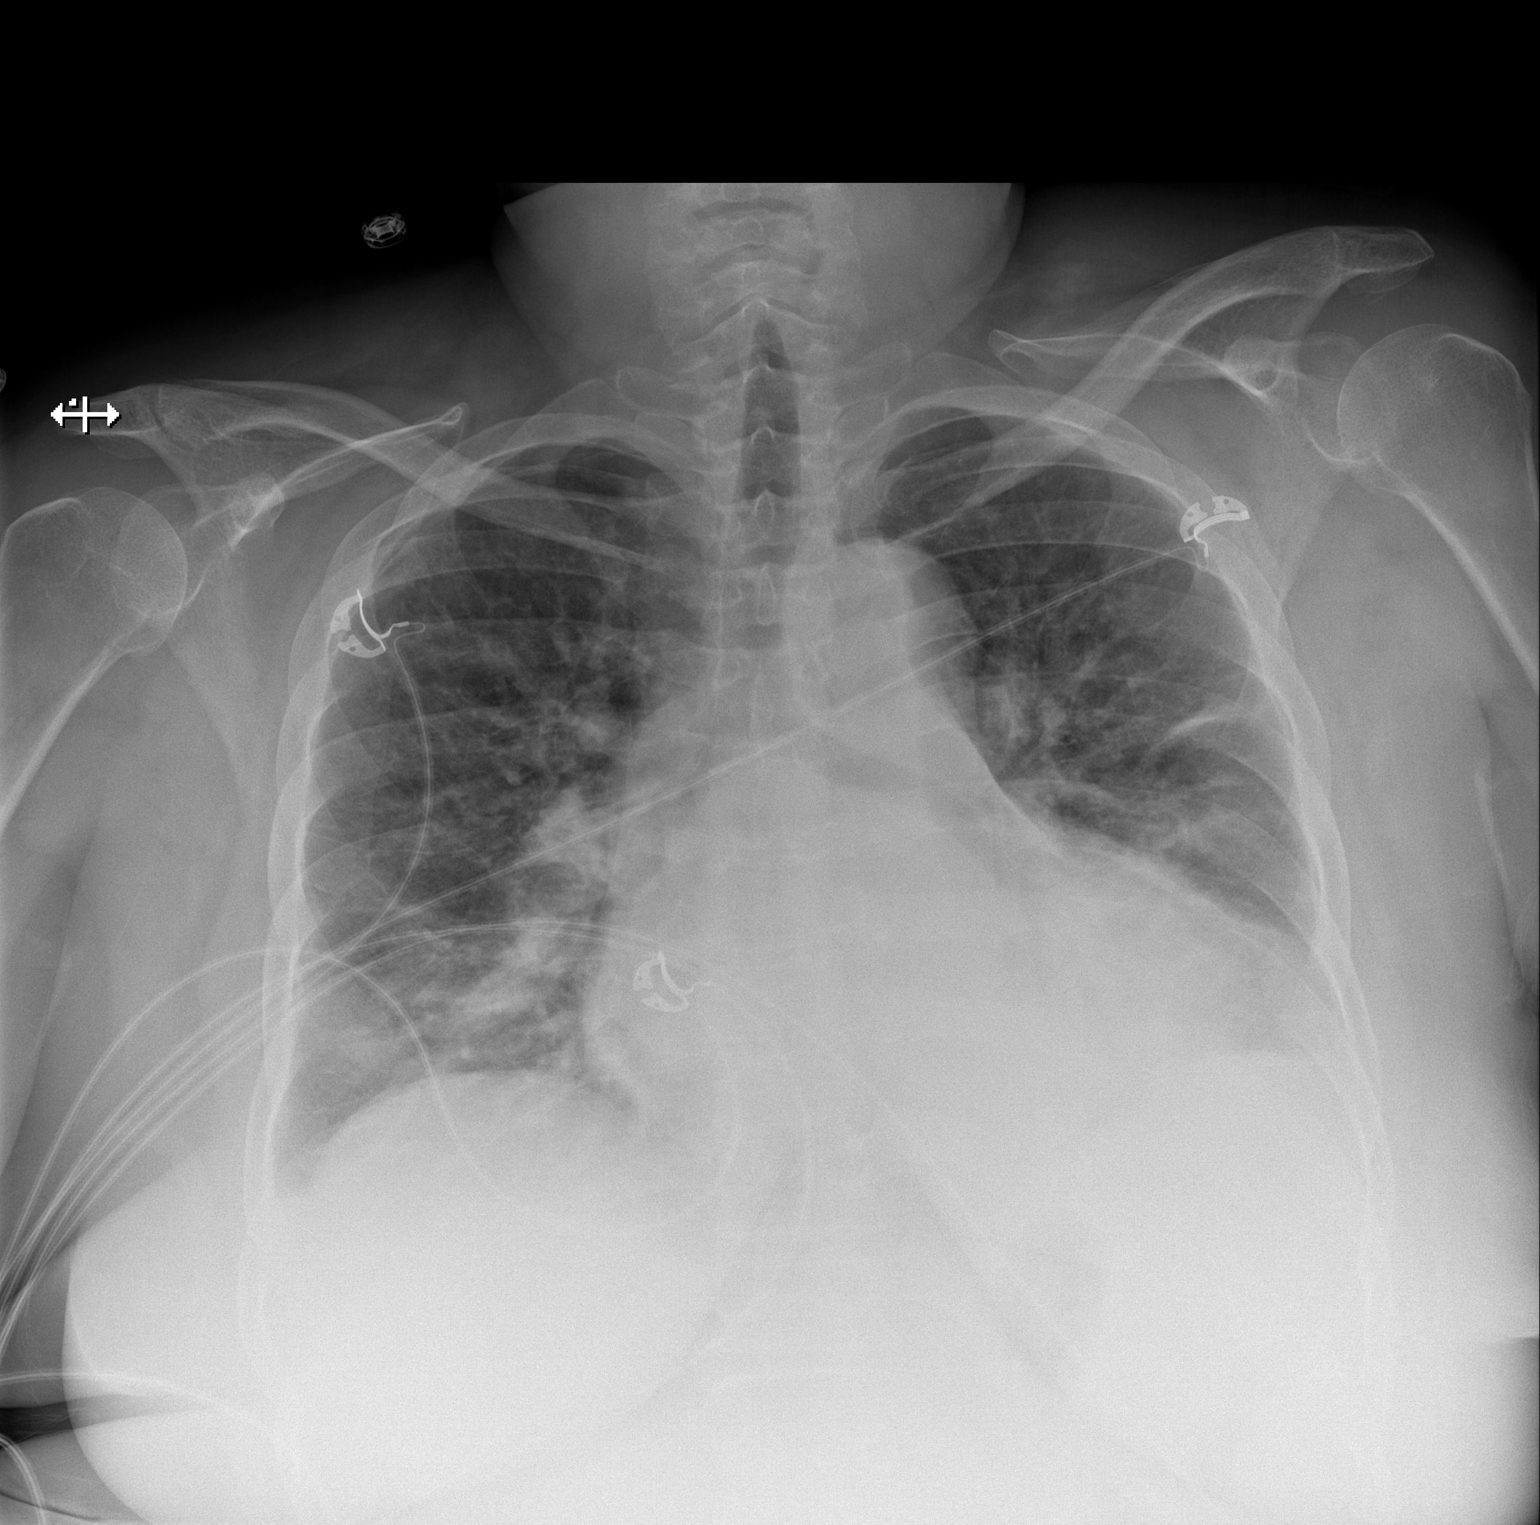

[w chest lat]
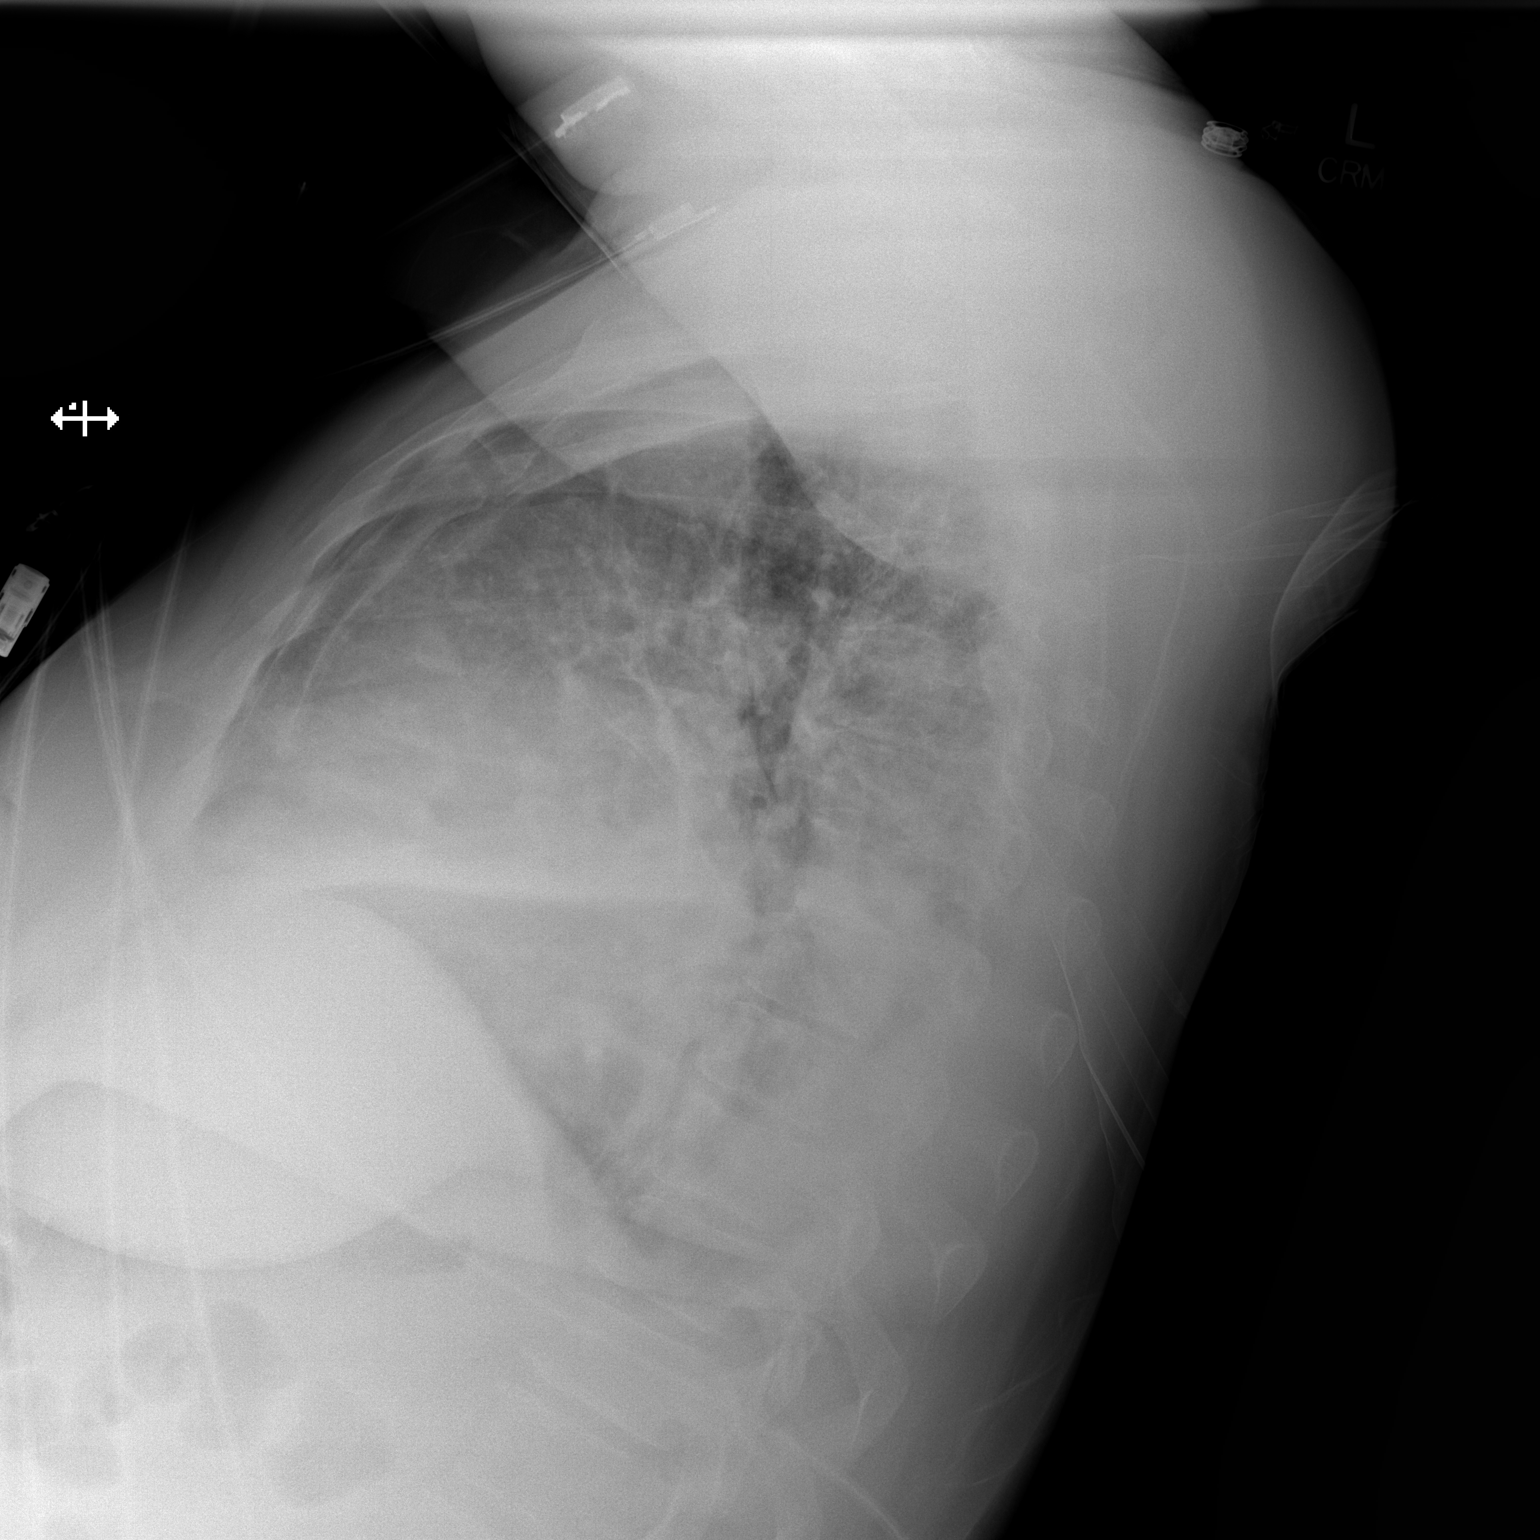

[2 of 2 positions shown; findings below may reference images not displayed]

FINDINGS: The cardiac silhouette is enlarged. Mediastinal contours appear
intact.

Persistent tiny right apical pneumothorax. Increased patchy airspace
opacities in the right lung base. Atelectatic changes in the left
mid lung field. Persistent left pleural effusion/airspace
consolidation.

Osseous structures are without acute abnormality. Soft tissues are
grossly normal.
IMPRESSION: Persistent tiny right apical pneumothorax, stable to slightly
smaller than on the prior radiograph.

Increased patchy airspace opacities in the right lung base, may
represent post bronchoscopy/post biopsy changes.

Persistent left lower lobe atelectasis/airspace consolidation and
pleural effusion.

## 2018-06-03 IMAGING — CR DG CHEST 1V PORT
1 series · 1 of 1 positions shown · non-contrast
Comparison: 04/29/2017

CLINICAL DATA: Status post bronchoscopy

EXAM:
PORTABLE CHEST 1 VIEW

[AP]
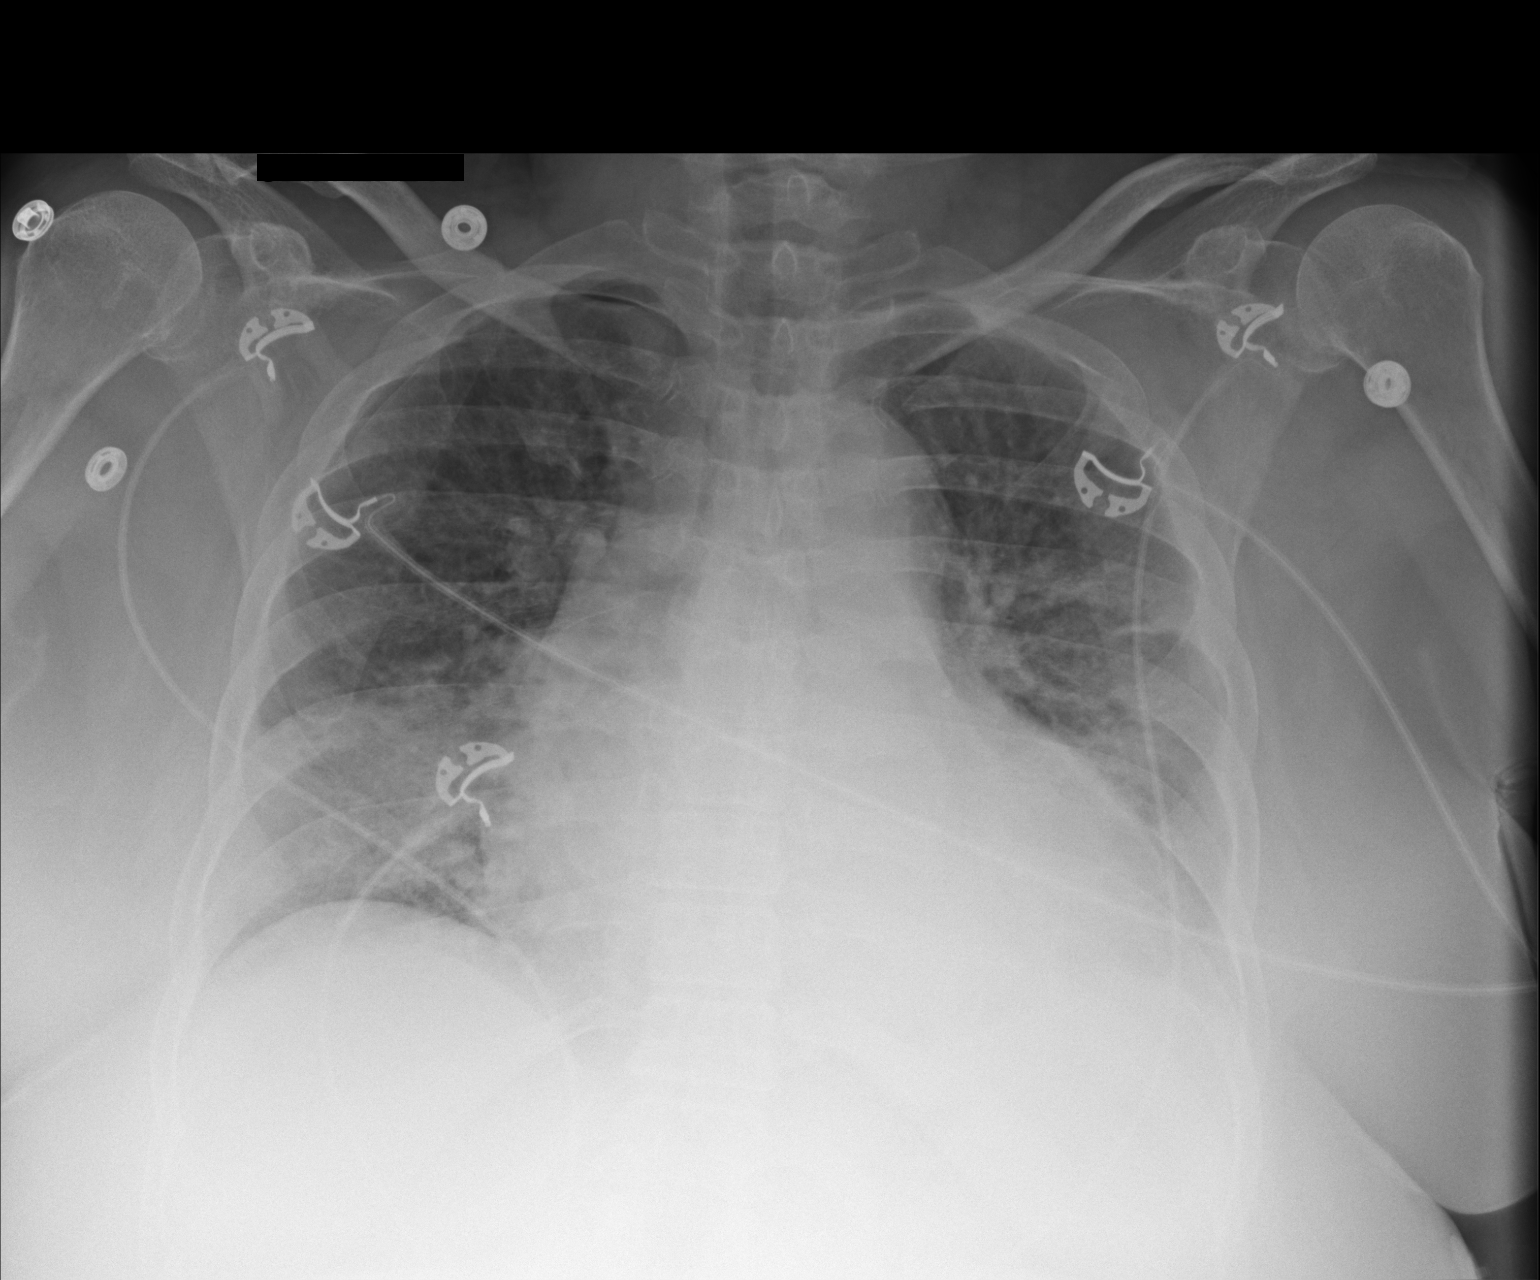

[1 of 1 positions shown; findings below may reference images not displayed]

FINDINGS: Cardiac shadow remains enlarged accentuated by the portable
technique. Patchy bilateral infiltrates are noted increased when
compared with the prior plain film examination particularly in the
right lung base. Portion of this may be related to the recent
bronchoscopy. Clinical correlation is recommended. A tiny right
apical pneumothorax is noted. No other focal abnormality is seen.
IMPRESSION: Tiny right apical pneumothorax following bronchoscopy.

Patchy infiltrates in the base increased from the prior exam. A
portion of this may be related to post bronchoscopy/biopsy changes.

These results will be called to the ordering clinician or
representative by the Radiologist Assistant, and communication
documented in the PACS or zVision Dashboard.

## 2018-06-19 ENCOUNTER — Ambulatory Visit
Admission: EM | Admit: 2018-06-19 | Discharge: 2018-06-19 | Disposition: A | Discharge status: Home / Self Care | Payer: BC Managed Care – PPO | Attending: Emergency Medicine | Admitting: Emergency Medicine

## 2018-06-19 DIAGNOSIS — J029 Acute pharyngitis, unspecified: Secondary | ICD-10-CM | POA: Diagnosis not present

## 2018-06-19 DIAGNOSIS — R6883 Chills (without fever): Secondary | ICD-10-CM | POA: Diagnosis not present

## 2018-06-19 DIAGNOSIS — H66001 Acute suppurative otitis media without spontaneous rupture of ear drum, right ear: Secondary | ICD-10-CM | POA: Diagnosis not present

## 2018-06-19 DIAGNOSIS — R05 Cough: Secondary | ICD-10-CM

## 2018-06-19 DIAGNOSIS — R51 Headache: Secondary | ICD-10-CM

## 2018-06-19 MED ORDER — FLUTICASONE PROPIONATE 50 MCG/ACT NA SUSP: 1.0000 | Freq: Every day | NASAL | 2 refills | Status: DC

## 2018-06-19 MED ORDER — ACETAMINOPHEN 500 MG PO TABS: 500.0000 mg | ORAL_TABLET | Freq: Four times a day (QID) | ORAL | 0 refills | Status: DC | PRN

## 2018-06-19 MED ORDER — AMOXICILLIN-POT CLAVULANATE 875-125 MG PO TABS: 1.0000 | ORAL_TABLET | Freq: Two times a day (BID) | ORAL | 0 refills | Status: AC

## 2018-06-19 MED ORDER — BENZONATATE 100 MG PO CAPS: 100.0000 mg | ORAL_CAPSULE | Freq: Three times a day (TID) | ORAL | 0 refills | Status: DC

## 2018-06-19 NOTE — Discharge Instructions (Signed)
Push fluids to ensure adequate hydration and keep secretions thin.  Tylenol and/or ibuprofen as needed for pain or fevers.  Daily nasal spray.  Complete course of antibiotics.  Tessalon as needed for cough.  May use over the counter treatments as needed for symptoms.  If symptoms worsen or do not improve in the next week to return to be seen or to follow up with your PCP.

## 2018-06-19 NOTE — ED Provider Notes (Signed)
[facility]    CSN: [id number] Arrival date & time: 06/19/18  1017     History   Chief Complaint Chief Complaint  Patient presents with  . Otalgia    HPI Kristin Burnett is a 53 y.o. female.   Kristin Burnett presents with complaints of right ear pain, sore throat, chills, occasional cough, headache, which started last night. She had chills. No gi/gu complaints. No rash. Took OTC sinus medication which did not help with symptoms. 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 No shortness of breath. Feels chest congestion. Cough is productive. Hx of bronchospasm, gerd, htn, UTI and urethral diverticulum.    ROS per HPI.      Past Medical History:  Diagnosis Date  . Bronchospasm 01/09/2013  . DUB (dysfunctional uterine bleeding)   . GERD (gastroesophageal reflux disease)    otc tums  . History of blood transfusion 2011   "related to fibrods"  . Hypertension   . Pneumonia ~ 2015; 04/29/2017  . Recurrent UTI, h/o urethra diverticuli 10/13/2009   Qualifier: Diagnosis of  By: Stanford Scotland MD, Jiles Garter    . Urethral diverticulum    Dr. Estill Dooms  . Yeast vaginitis    Dr. Estill Dooms    Patient Active Problem List   Diagnosis Date Noted  . Tachypnea 04/29/2017  . Pneumonia 04/29/2017  . Pneumonia of both lungs due to infectious organism 03/08/2017  . 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 FOM (frequency of micturition) 07/06/2014  . Phlebitis of arm 10/20/2013  . Anxiety and depression 03/05/2013  . Bronchospasm 01/09/2013  . Annual physical exam 09/19/2012  . Edema of   legs, L>R 09/19/2012  . GERD (gastroesophageal reflux disease) 09/19/2012  . LEIOMYOMA OF UTERUS, UNSPECIFIED 10/13/2009  . ANEMIA, HYPOCHROMIC 10/13/2009  . Essential hypertension, benign 10/13/2009  . Recurrent UTI, h/o urethra diverticuli 10/13/2009  . DYSFUNCTIONAL UTERINE BLEEDING 10/13/2009    Past Surgical History:  Procedure Laterality Date  . BILATERAL SALPINGECTOMY  03/07/2012   Procedure:  BILATERAL SALPINGECTOMY;  Surgeon: Lavonia Drafts, MD;  Location: [facility];  Service: Gynecology;  Laterality: Bilateral;  . CYSTOSCOPY  03/07/2012   Procedure: CYSTOSCOPY;  Surgeon: Lavonia Drafts, MD;  Location: [facility];  Service: Gynecology;  Laterality: N/A;  . LASER ABLATION     "before they took my uterus out"  . ROBOTIC ASSISTED TOTAL HYSTERECTOMY  03/07/2012   Procedure: ROBOTIC ASSISTED TOTAL HYSTERECTOMY;  Surgeon: Lavonia Drafts, MD;  Location: [facility];  Service: Gynecology;  Laterality: N/A;  . TUBAL LIGATION    . VIDEO BRONCHOSCOPY Bilateral 05/01/2017   Procedure: VIDEO BRONCHOSCOPY WITH FLUORO;  Surgeon: Marshell Garfinkel, MD;  Location: [facility];  Service: Cardiopulmonary;  Laterality: Bilateral;    OB History    Gravida  3   Para  3   Term  3   Preterm      AB      Living  3     SAB      TAB      Ectopic      Multiple      Live Births               Home Medications    Prior to Admission medications   Medication Sig Start Date End Date Taking? Authorizing Provider  acetaminophen (TYLENOL) 500 MG tablet Take 1 tablet (500 mg total) by mouth every 6 (six) hours as needed. 06/19/18   Augusto Gamble B, NP  amLODipine (NORVASC) 10 MG tablet Take 1  tablet (10 mg total) by mouth daily. 04/08/18   Jaynee Eagles, PA-C  amoxicillin-clavulanate (AUGMENTIN) 875-125 MG tablet Take 1 tablet by mouth every 12 (twelve) hours for 5 days. 06/19/18 06/24/18  Zigmund Gottron, NP  benzonatate (TESSALON) 100 MG capsule Take 1 capsule (100 mg total) by mouth every 8 (eight) hours. 06/19/18   Zigmund Gottron, NP  fluticasone (FLONASE) 50 MCG/ACT nasal spray Place 1 spray into both nostrils daily. 06/19/18   Zigmund Gottron, NP  lisinopril (PRINIVIL,ZESTRIL) 20 MG tablet Take 1 tablet (20 mg total) by mouth daily. 04/08/18 04/08/19  Jaynee Eagles, PA-C    Family History Family History  Problem Relation Age of Onset  . Hypertension Mother        [place]  .  Arthritis Mother   . Anemia Mother   . Cancer Mother        uterine?  . Diabetes Father   . Stroke Father 46  . Heart disease Father        CHF, has a defib  . Heart disease Brother        CHF  . Cancer Other        MGM "blood cancer"  . Colon cancer Neg Hx   . Breast cancer Neg Hx     Social History Social History   Tobacco Use  . Smoking status: Current Some Day Smoker    Packs/day: 0.50    Years: 36.00    Pack years: 18.00    Types: Cigarettes    Start date: 06/21/1986  . Smokeless tobacco: Never Used  . Tobacco comment: a pack will last 3-4 days--05/30/17  Substance Use Topics  . Alcohol use: No    Alcohol/week: 0.0 standard drinks  . Drug use: No     Allergies   Patient has no known allergies.   Review of Systems Review of Systems   Physical Exam Triage Vital Signs ED Triage Vitals  Enc Vitals Group     BP 06/19/18 1027 (!) 154/108     Pulse Rate 06/19/18 1027 97     Resp 06/19/18 1027 18     Temp 06/19/18 1027 99.8 F (37.7 C)     Temp Source 06/19/18 1027 Oral     SpO2 06/19/18 1027 99 %     Weight --      Height --      Head Circumference --      Peak Flow --      Pain Score 06/19/18 1028 0     Pain Loc --      Pain Edu? --      Excl. in [place]? --    No data found.  Updated Vital Signs BP (!) 154/108 (BP Location: Left Arm)   Pulse 97   Temp 99.8 F (37.7 C) (Oral)   Resp 18   LMP 02/29/2012 (Exact Date)   SpO2 99%    Physical Exam Constitutional:      General: She is not in acute distress.    Appearance: She is well-developed.  HENT:     Head: Normocephalic and atraumatic.     Right Ear: Ear canal and external ear normal. A middle ear effusion is present. Tympanic membrane is erythematous.     Left Ear: Ear canal and external ear normal. A middle ear effusion is present.     Nose: Nose normal.     Mouth/Throat:     Pharynx: Uvula midline.     Tonsils: No tonsillar  exudate.  Eyes:     Conjunctiva/sclera: Conjunctivae normal.      Pupils: Pupils are equal, round, and reactive to light.  Cardiovascular:     Rate and Rhythm: Normal rate and regular rhythm.     Heart sounds: Normal heart sounds.  Pulmonary:     Effort: Pulmonary effort is normal.     Breath sounds: Normal breath sounds.  Skin:    General: Skin is warm and dry.  Neurological:     Mental Status: She is alert and oriented to person, place, and time.      UC Treatments / Results  Labs (all labs ordered are listed, but only abnormal results are displayed) Labs Reviewed - No data to display  EKG None  Radiology No results found.  Procedures Procedures (including critical care time)  Medications Ordered in UC Medications - No data to display  Initial Impression / Assessment and Plan / UC Course  I have reviewed the triage vital signs and the nursing notes.  Pertinent labs & imaging results that were available during my care of the patient were reviewed by me and considered in my medical decision making (see chart for details).     Right ear is concerning for AOM with 5 day course of augmentin provided. Endorses not taken her BP medication today, encouraged to do so. Continue with supportive cares as needed for symptoms. Return precautions provided. Patient verbalized understanding and agreeable to plan.  Ambulatory out of clinic without difficulty.    Final Clinical Impressions(s) / UC Diagnoses   Final diagnoses:  Acute suppurative otitis media of right ear without spontaneous rupture of tympanic membrane, recurrence not specified     Discharge Instructions     Push fluids to ensure adequate hydration and keep secretions thin.  Tylenol and/or ibuprofen as needed for pain or fevers.  Daily nasal spray.  Complete course of antibiotics.  Tessalon as needed for cough.  May use over the counter treatments as needed for symptoms.  If symptoms worsen or do not improve in the next week to return to be seen or to follow up with your  PCP.     ED Prescriptions    Medication Sig Dispense Auth. Provider   acetaminophen (TYLENOL) 500 MG tablet Take 1 tablet (500 mg total) by mouth every 6 (six) hours as needed. 30 tablet Augusto Gamble B, NP   benzonatate (TESSALON) 100 MG capsule Take 1 capsule (100 mg total) by mouth every 8 (eight) hours. 21 capsule Augusto Gamble B, NP   amoxicillin-clavulanate (AUGMENTIN) 875-125 MG tablet Take 1 tablet by mouth every 12 (twelve) hours for 5 days. 10 tablet Augusto Gamble B, NP   fluticasone (FLONASE) 50 MCG/ACT nasal spray Place 1 spray into both nostrils daily. 16 g Zigmund Gottron, NP     Controlled Substance Prescriptions [place] Controlled Substance Registry consulted? Not Applicable   Zigmund Gottron, NP 06/19/18 1056

## 2018-06-19 NOTE — ED Triage Notes (Signed)
Pt c/o rt ear pain, sore throat and chills since last night

## 2018-06-28 ENCOUNTER — Encounter: Payer: Self-pay | Admitting: Family Medicine

## 2018-07-02 ENCOUNTER — Ambulatory Visit (INDEPENDENT_AMBULATORY_CARE_PROVIDER_SITE_OTHER): Payer: BC Managed Care – PPO | Admitting: Family Medicine

## 2018-07-02 ENCOUNTER — Encounter: Payer: Self-pay | Admitting: Family Medicine

## 2018-07-02 VITALS — BP 153/109 | HR 75 | Temp 97.6°F | Resp 17 | Ht 66.0 in | Wt 237.6 lb

## 2018-07-02 DIAGNOSIS — Z23 Encounter for immunization: Secondary | ICD-10-CM

## 2018-07-02 DIAGNOSIS — F17210 Nicotine dependence, cigarettes, uncomplicated: Secondary | ICD-10-CM | POA: Diagnosis not present

## 2018-07-02 DIAGNOSIS — H9203 Otalgia, bilateral: Secondary | ICD-10-CM | POA: Diagnosis not present

## 2018-07-02 DIAGNOSIS — Z7689 Persons encountering health services in other specified circumstances: Secondary | ICD-10-CM

## 2018-07-02 DIAGNOSIS — Z8701 Personal history of pneumonia (recurrent): Secondary | ICD-10-CM

## 2018-07-02 DIAGNOSIS — R799 Abnormal finding of blood chemistry, unspecified: Secondary | ICD-10-CM | POA: Diagnosis not present

## 2018-07-02 DIAGNOSIS — I10 Essential (primary) hypertension: Secondary | ICD-10-CM

## 2018-07-02 DIAGNOSIS — F17200 Nicotine dependence, unspecified, uncomplicated: Secondary | ICD-10-CM

## 2018-07-02 DIAGNOSIS — R7989 Other specified abnormal findings of blood chemistry: Secondary | ICD-10-CM

## 2018-07-02 MED ORDER — VARENICLINE TARTRATE 1 MG PO TABS: 1.0000 mg | ORAL_TABLET | Freq: Two times a day (BID) | ORAL | 0 refills | Status: DC

## 2018-07-02 MED ORDER — CARBAMIDE PEROXIDE 6.5 % OT SOLN: 5.0000 [drp] | Freq: Two times a day (BID) | OTIC | 0 refills | Status: DC

## 2018-07-02 MED ORDER — VARENICLINE TARTRATE 0.5 MG X 11 & 1 MG X 42 PO MISC: ORAL | 0 refills | Status: DC

## 2018-07-02 MED ORDER — AMLODIPINE BESYLATE 10 MG PO TABS: 10.0000 mg | ORAL_TABLET | Freq: Every day | ORAL | 0 refills | Status: DC

## 2018-07-02 MED ORDER — LISINOPRIL 20 MG PO TABS: 20.0000 mg | ORAL_TABLET | Freq: Every day | ORAL | 0 refills | Status: DC

## 2018-07-02 NOTE — Patient Instructions (Addendum)
Thank you for choosing Primary Care at [facility] to be your medical home!    Kristin Burnett was seen by Molli Barrows, FNP today.   August Luz Hauge's primary care provider is Scot Jun, FNP.   For the best care possible, you should try to see Molli Barrows, FNP-C whenever you come to the clinic.   We look forward to seeing you again soon!  If you have any questions about your visit today, please call us at [phone] or feel free to reach your primary care provider via [facility].     Bilateral ear pain continue Flonase I would also recommend purchasing an over-the-counter antihistamine which would also help with bilateral ear congestion. I recommend cetirizine which is Zyrtec 10 mg once daily at bedtime and or levo cetirizine which is Xyzal 5 mg once daily at bedtime. I have sent over Debrox drops as you have quite a bit of cerumen impacted in your left ear.  Please use medication as directed to loosen earwax.  Continue with current hypertension meds.  I recommend that you take medication daily with breakfast.  Prior to blood pressure check visit please take medication.   Instruction for Chantix  Take one 0.5 mg tablet by mouth once daily for 3 days, then increase to one 0.5 mg tablet twice daily for 4 days, then increase to one 1 mg tablet twice daily.   Steps to Quit Smoking  Smoking tobacco can be bad for your health. It can also affect almost every organ in your body. Smoking puts you and people around you at risk for many serious long-lasting (chronic) diseases. Quitting smoking is hard, but it is one of the best things that you can do for your health. It is never too late to quit. What are the benefits of quitting smoking? When you quit smoking, you lower your risk for getting serious diseases and conditions. They can include:  Lung cancer or lung disease.  Heart disease.  Stroke.  Heart attack.  Not being able to have children (infertility).  Weak bones  (osteoporosis) and broken bones (fractures). If you have coughing, wheezing, and shortness of breath, those symptoms may get better when you quit. You may also get sick less often. If you are pregnant, quitting smoking can help to lower your chances of having a baby of low birth weight. What can I do to help me quit smoking? Talk with your doctor about what can help you quit smoking. Some things you can do (strategies) include:  Quitting smoking totally, instead of slowly cutting back how much you smoke over a period of time.  Going to in-person counseling. You are more likely to quit if you go to many counseling sessions.  Using resources and support systems, such as: ? Database administrator with a Social worker. ? Phone quitlines. ? Careers information officer. ? Support groups or group counseling. ? Text messaging programs. ? Mobile phone apps or applications.  Taking medicines. Some of these medicines may have nicotine in them. If you are pregnant or breastfeeding, do not take any medicines to quit smoking unless your doctor says it is okay. Talk with your doctor about counseling or other things that can help you. Talk with your doctor about using more than one strategy at the same time, such as taking medicines while you are also going to in-person counseling. This can help make quitting easier. What things can I do to make it easier to quit? Quitting smoking might feel very hard  at first, but there is a lot that you can do to make it easier. Take these steps:  Talk to your family and friends. Ask them to support and encourage you.  Call phone quitlines, reach out to support groups, or work with a Social worker.  Ask people who smoke to not smoke around you.  Avoid places that make you want (trigger) to smoke, such as: ? Bars. ? Parties. ? Smoke-break areas at work.  Spend time with people who do not smoke.  Lower the stress in your life. Stress can make you want to smoke. Try these things to  help your stress: ? Getting regular exercise. ? Deep-breathing exercises. ? Yoga. ? Meditating. ? Doing a body scan. To do this, close your eyes, focus on one area of your body at a time from head to toe, and notice which parts of your body are tense. Try to relax the muscles in those areas.  Download or buy apps on your mobile phone or tablet that can help you stick to your quit plan. There are many free apps, such as QuitGuide from the [employer] Office manager for Disease Control and Prevention). You can find more support from smokefree.gov and other websites. This information is not intended to replace advice given to you by your health care provider. Make sure you discuss any questions you have with your health care provider. Document Released: 02/04/2009 Document Revised: 12/07/2015 Document Reviewed: 08/25/2014 Elsevier Interactive Patient Education  2019 [employer].   Hypertension Hypertension, commonly called high blood pressure, is when the force of blood pumping through the arteries is too strong. The arteries are the blood vessels that carry blood from the heart throughout the body. Hypertension forces the heart to work harder to pump blood and may cause arteries to become narrow or stiff. Having untreated or uncontrolled hypertension can cause heart attacks, strokes, kidney disease, and other problems. A blood pressure reading consists of a higher number over a lower number. Ideally, your blood pressure should be below 120/80. The first ("top") number is called the systolic pressure. It is a measure of the pressure in your arteries as your heart beats. The second ("bottom") number is called the diastolic pressure. It is a measure of the pressure in your arteries as the heart relaxes. What are the causes? The cause of this condition is not known. What increases the risk? Some risk factors for high blood pressure are under your control. Others are not. Factors you can  change  Smoking.  Having type 2 diabetes mellitus, high cholesterol, or both.  Not getting enough exercise or physical activity.  Being overweight.  Having too much fat, sugar, calories, or salt (sodium) in your diet.  Drinking too much alcohol. Factors that are difficult or impossible to change  Having chronic kidney disease.  Having a family history of high blood pressure.  Age. Risk increases with age.  Race. You may be at higher risk if you are African-American.  Gender. Men are at higher risk than women before age 66. After age 32, women are at higher risk than men.  Having obstructive sleep apnea.  Stress. What are the signs or symptoms? Extremely high blood pressure (hypertensive crisis) may cause:  Headache.  Anxiety.  Shortness of breath.  Nosebleed.  Nausea and vomiting.  Severe chest pain.  Jerky movements you cannot control (seizures). How is this diagnosed? This condition is diagnosed by measuring your blood pressure while you are seated, with your arm resting on a surface.  The cuff of the blood pressure monitor will be placed directly against the skin of your upper arm at the level of your heart. It should be measured at least twice using the same arm. Certain conditions can cause a difference in blood pressure between your right and left arms. Certain factors can cause blood pressure readings to be lower or higher than normal (elevated) for a short period of time:  When your blood pressure is higher when you are in a health care provider's office than when you are at home, this is called white coat hypertension. Most people with this condition do not need medicines.  When your blood pressure is higher at home than when you are in a health care provider's office, this is called masked hypertension. Most people with this condition may need medicines to control blood pressure. If you have a high blood pressure reading during one visit or you have normal  blood pressure with other risk factors:  You may be asked to return on a different day to have your blood pressure checked again.  You may be asked to monitor your blood pressure at home for 1 week or longer. If you are diagnosed with hypertension, you may have other blood or imaging tests to help your health care provider understand your overall risk for other conditions. How is this treated? This condition is treated by making healthy lifestyle changes, such as eating healthy foods, exercising more, and reducing your alcohol intake. Your health care provider may prescribe medicine if lifestyle changes are not enough to get your blood pressure under control, and if:  Your systolic blood pressure is above 130.  Your diastolic blood pressure is above 80. Your personal target blood pressure may vary depending on your medical conditions, your age, and other factors. Follow these instructions at home: Eating and drinking   Eat a diet that is high in fiber and potassium, and low in sodium, added sugar, and fat. An example eating plan is called the DASH (Dietary Approaches to Stop Hypertension) diet. To eat this way: ? Eat plenty of fresh fruits and vegetables. Try to fill half of your plate at each meal with fruits and vegetables. ? Eat whole grains, such as whole wheat pasta, brown rice, or whole grain bread. Fill about one quarter of your plate with whole grains. ? Eat or drink low-fat dairy products, such as skim milk or low-fat yogurt. ? Avoid fatty cuts of meat, processed or cured meats, and poultry with skin. Fill about one quarter of your plate with lean proteins, such as fish, chicken without skin, beans, eggs, and tofu. ? Avoid premade and processed foods. These tend to be higher in sodium, added sugar, and fat.  Reduce your daily sodium intake. Most people with hypertension should eat less than 1,500 mg of sodium a day.  Limit alcohol intake to no more than 1 drink a day for  nonpregnant women and 2 drinks a day for men. One drink equals 12 oz of beer, 5 oz of wine, or 1 oz of hard liquor. Lifestyle   Work with your health care provider to maintain a healthy body weight or to lose weight. Ask what an ideal weight is for you.  Get at least 30 minutes of exercise that causes your heart to beat faster (aerobic exercise) most days of the week. Activities may include walking, swimming, or biking.  Include exercise to strengthen your muscles (resistance exercise), such as pilates or lifting weights, as part of  your weekly exercise routine. Try to do these types of exercises for 30 minutes at least 3 days a week.  Do not use any products that contain nicotine or tobacco, such as cigarettes and e-cigarettes. If you need help quitting, ask your health care provider.  Monitor your blood pressure at home as told by your health care provider.  Keep all follow-up visits as told by your health care provider. This is important. Medicines  Take over-the-counter and prescription medicines only as told by your health care provider. Follow directions carefully. Blood pressure medicines must be taken as prescribed.  Do not skip doses of blood pressure medicine. Doing this puts you at risk for problems and can make the medicine less effective.  Ask your health care provider about side effects or reactions to medicines that you should watch for. Contact a health care provider if:  You think you are having a reaction to a medicine you are taking.  You have headaches that keep coming back (recurring).  You feel dizzy.  You have swelling in your ankles.  You have trouble with your vision. Get help right away if:  You develop a severe headache or confusion.  You have unusual weakness or numbness.  You feel faint.  You have severe pain in your chest or abdomen.  You vomit repeatedly.  You have trouble breathing. Summary  Hypertension is when the force of blood  pumping through your arteries is too strong. If this condition is not controlled, it may put you at risk for serious complications.  Your personal target blood pressure may vary depending on your medical conditions, your age, and other factors. For most people, a normal blood pressure is less than 120/80.  Hypertension is treated with lifestyle changes, medicines, or a combination of both. Lifestyle changes include weight loss, eating a healthy, low-sodium diet, exercising more, and limiting alcohol. This information is not intended to replace advice given to you by your health care provider. Make sure you discuss any questions you have with your health care provider. Document Released: 04/10/2005 Document Revised: 03/08/2016 Document Reviewed: 03/08/2016 Elsevier Interactive Patient Education  2019 [employer].

## 2018-07-02 NOTE — Progress Notes (Signed)
Kristin Burnett, is a 53 y.o. female  [id number]  [id number]  DOB - 08/07/1965  CC:  Chief Complaint  Patient presents with  . Establish Care  . Hypertension       HPI: Kristin Burnett is a 53 y.o. female is here today to establish care and hypertension management.  Kristin Burnett has LEIOMYOMA OF UTERUS, UNSPECIFIED; ANEMIA, HYPOCHROMIC; Essential hypertension, benign; Recurrent UTI, h/o urethra diverticuli; DYSFUNCTIONAL UTERINE BLEEDING; Annual physical exam; Edema of   legs, L>R; GERD (gastroesophageal reflux disease); Bronchospasm; Anxiety and depression; Phlebitis of arm; Urethra, diverticulum; FOM (frequency of micturition); Pneumonia of both lungs due to infectious organism; Tachypnea; and Pneumonia on their problem list.   Patient presents today with a very long history of hypertension.  Has been lost to follow-up with the PCP for over 1 year and is uncertain of current status of blood pressure control.  She does not monitor her blood pressure at home.  Endorses frequently missing doses of medication.  In review of EMR patient had abnormal elevated creatinine level over a year ago and was taken off hydrochlorothiazide as this was thought to be the cause.  She has not had any follow-up labs performed since that time.  Patient denies any dizziness, headache, visual changes, chest pain, or shortness of breath.  She is an active of routine physical activity and is a current 1 pack/day smoker.  However she wishes to stop smoking and would like a prescription for Chantix today.  Patient also complains of 2 weeks of bilateral ear pain.  She was recently treated with an antibiotic for sinusitis which she has completed medication.  She complains that ears continue to ache and feel full.  She was also prescribed Flonase which she is using daily as prescribed.  She denies any nasal congestion, cough, chest tightness,or sore throat.  Current medications: Current Outpatient Medications:  .  amLODipine  (NORVASC) 10 MG tablet, Take 1 tablet (10 mg total) by mouth daily., Disp: 30 tablet, Rfl: 0 .  fluticasone (FLONASE) 50 MCG/ACT nasal spray, Place 1 spray into both nostrils daily., Disp: 16 g, Rfl: 2 .  lisinopril (PRINIVIL,ZESTRIL) 20 MG tablet, Take 1 tablet (20 mg total) by mouth daily., Disp: 30 tablet, Rfl: 0 .  carbamide peroxide (DEBROX) 6.5 % OTIC solution, Place 5 drops into both ears 2 (two) times daily., Disp: 15 mL, Rfl: 0   Pertinent family medical history: family history includes Anemia in her mother; Arthritis in her mother; Cancer in her mother; Diabetes in her father; Heart disease in her brother and father; Heart failure in her brother and father; Hypertension in her father and mother; Leukemia in her maternal grandmother; Stroke (age of onset: 103) in her father.   No Known Allergies  Social History   Socioeconomic History  . Marital status: Divorced    Spouse name: Not on file  . Number of children: 3  . Years of education: Not on file  . Highest education level: Not on file  Occupational History  . Occupation: bus Education administrator: [employer]  . Financial resource strain: Not on file  . Food insecurity:    Worry: Not on file    Inability: Not on file  . Transportation needs:    Medical: Not on file    Non-medical: Not on file  Tobacco Use  . Smoking status: Current Some Day Smoker    Packs/day: 1.00    Years: 36.00    Pack years:  36.00    Types: Cigarettes    Start date: 06/21/1986  . Smokeless tobacco: Never Used  . Tobacco comment: a pack will last 3-4 days--05/30/17  Substance and Sexual Activity  . Alcohol use: No    Alcohol/week: 0.0 standard drinks  . Drug use: No  . Sexual activity: Not Currently    Birth control/protection: Surgical  Lifestyle  . Physical activity:    Days per week: Not on file    Minutes per session: Not on file  . Stress: Not on file  Relationships  . Social connections:    Talks on phone: Not on  file    Gets together: Not on file    Attends religious service: Not on file    Active member of club or organization: Not on file    Attends meetings of clubs or organizations: Not on file    Relationship status: Not on file  . Intimate partner violence:    Fear of current or ex partner: Not on file    Emotionally abused: Not on file    Physically abused: Not on file    Forced sexual activity: Not on file  Other Topics Concern  . Not on file  Social History Narrative   Household: pt and 2 children   Regular exercise: was; not been in 1 mth   Caffeine use: coffee daily    Review of Systems: Pertinent negatives listed in HPI Objective:   Vitals:   07/02/18 1038 07/02/18 1101  BP: (!) 160/108 (!) 153/109  Pulse: 75   Resp: 17   Temp: 97.6 F (36.4 C)   SpO2: 98%     BP Readings from Last 3 Encounters:  07/02/18 (!) 153/109  06/19/18 (!) 154/108  04/08/18 (!) 179/111    Filed Weights   07/02/18 1038  Weight: 237 lb 9.6 oz (107.8 kg)      Physical Exam: Constitutional: Patient appears well-developed and well-nourished. No distress. HENT: Normocephalic, atraumatic, right ear canal TM visible and no signs of erythema.  Left ear TM not visible due to impacted cerumen.   Eyes: Conjunctivae and EOM are normal. PERRLA, no scleral icterus. Neck: Normal ROM. Neck supple. No JVD. No tracheal deviation. No thyromegaly. CVS: RRR, S1/S2 +, no murmurs, no gallops, no carotid bruit.  Pulmonary: Effort and breath sounds normal, no stridor, rhonchi, wheezes, rales.  Abdominal: Soft. BS +, no distension, tenderness, rebound or guarding.  Musculoskeletal: Normal range of motion. No edema and no tenderness.  Neuro: Alert. Normal muscle tone coordination. Normal gait.  Skin: Skin is warm and dry. No rash noted. Not diaphoretic. No erythema. No pallor. Psychiatric: Normal mood and affect. Behavior, judgment, thought content normal.  Lab Results (prior encounters)  Lab Results   Component Value Date   WBC 9.8 05/30/2017   HGB 11.4 (L) 05/30/2017   HCT 34.8 (L) 05/30/2017   MCV 83.2 05/30/2017   PLT 412.0 (H) 05/30/2017   Lab Results  Component Value Date   CREATININE 1.37 (H) 05/02/2017   BUN 11 05/02/2017   NA 135 05/02/2017   K 3.8 05/02/2017   CL 108 05/02/2017   CO2 21 (L) 05/02/2017       Assessment and plan:  1. Encounter to establish care  2. Essential hypertension Patient encouraged to start a designated time to take medication.  I recommend taking medication with breakfast.  Take medication consistently. We discussed the importance of compliance with medical therapy and DASH diet recommended, consequences of uncontrolled hypertension discussed.  -  continue current BP medications - lisinopril (PRINIVIL,ZESTRIL) 20 MG tablet; Take 1 tablet (20 mg total) by mouth daily.  Dispense: 30 tablet; Refill: 0 - amLODipine (NORVASC) 10 MG tablet; Take 1 tablet (10 mg total) by mouth daily.  Dispense: 30 tablet; Refill: 0 - Comprehensive metabolic panel  3. Abnormal blood creatinine level Over a year ago patient had elevation of creatinine and elevation of BUN. Patient was previously on HCTZ this was discontinued.  We will repeat a CMP to see if renal function has normalized. - Comprehensive metabolic panel  4. Hx of bacterial pneumonia -x 3 Pneumococcal 23 given today  5. Acute ear pain, bilateral, left cerumen impacted. Recommended irrigation if symptoms did not improve. -Continue Flonase and recommend antihistamine OTC cetirizine or levocetirizine - carbamide peroxide (DEBROX) 6.5 % OTIC solution; Place 5 drops into both ears 2 (two) times daily.  Dispense: 15 mL; Refill: 0  6. Need for pneumococcal vaccine - Pneumococcal polysaccharide vaccine 23-valent greater than or equal to 2yo subcutaneous/IM   Return in about 3 months (around 10/02/2018), or CPE, PAP, fasting labs within 3 months; RN visit 4 weeks BP.   The patient was given clear  instructions to go to ER or return to medical center if symptoms don't improve, worsen or new problems develop. The patient verbalized understanding. The patient was advised  to call and obtain lab results if they haven't heard anything from out office within 7-10 business days.  Molli Barrows, FNP Primary Care at [facility] [address] [place] [address] [phone]: [phone]    This note has been created with Dragon speech recognition software and Engineer, materials. Any transcriptional errors are unintentional.

## 2018-07-04 LAB — COMPREHENSIVE METABOLIC PANEL
ALT: 17 IU/L (ref 0–32)
AST: 21 IU/L (ref 0–40)
Albumin/Globulin Ratio: 1.1 — ABNORMAL LOW (ref 1.2–2.2)
Albumin: 4 g/dL (ref 3.8–4.8)
Alkaline Phosphatase: 277 IU/L — ABNORMAL HIGH (ref 39–117)
BILIRUBIN TOTAL: 0.3 mg/dL (ref 0.0–1.2)
BUN/Creatinine Ratio: 11 (ref 9–23)
BUN: 15 mg/dL (ref 6–24)
CO2: 19 mmol/L — ABNORMAL LOW (ref 20–29)
Calcium: 9.3 mg/dL (ref 8.7–10.2)
Chloride: 106 mmol/L (ref 96–106)
Creatinine, Ser: 1.4 mg/dL — ABNORMAL HIGH (ref 0.57–1.00)
GFR calc Af Amer: 51 mL/min/{1.73_m2} — ABNORMAL LOW (ref >59)
GFR calc non Af Amer: 44 mL/min/{1.73_m2} — ABNORMAL LOW (ref >59)
GLUCOSE: 86 mg/dL (ref 65–99)
Globulin, Total: 3.6 g/dL (ref 1.5–4.5)
Potassium: 4.1 mmol/L (ref 3.5–5.2)
Sodium: 139 mmol/L (ref 134–144)
TOTAL PROTEIN: 7.6 g/dL (ref 6.0–8.5)

## 2018-07-04 LAB — SPECIMEN STATUS REPORT

## 2018-07-12 ENCOUNTER — Encounter: Payer: Self-pay | Admitting: *Deleted

## 2018-07-30 ENCOUNTER — Ambulatory Visit: Payer: BC Managed Care – PPO

## 2018-07-31 ENCOUNTER — Ambulatory Visit: Payer: BC Managed Care – PPO

## 2018-09-25 ENCOUNTER — Other Ambulatory Visit: Payer: Self-pay | Admitting: Family Medicine

## 2018-09-25 DIAGNOSIS — I10 Essential (primary) hypertension: Secondary | ICD-10-CM

## 2018-10-01 ENCOUNTER — Telehealth: Payer: Self-pay

## 2018-10-01 NOTE — Telephone Encounter (Signed)
Called patient to do their pre-visit COVID screening.  Have you recently traveled internationally([place], [place], [place], [place], [place]) or within the [place] to a hotspot area([place], [place], [place], [place], [place])? no  Are you currently experiencing any of the following: fever, cough, SHOB, fatigue, body aches, loss of smell, rash, diarrhea, vomiting, severe headaches, weakness, sore throat? no  Have you been in contact with anyone who has recently travelled? no  Have you been in contact with anyone who is experiencing any of the above symptoms or been diagnosed with Standard  or works in or has recently visited a SNF? No  Reminded patient to come fasting for CPE labs.

## 2018-10-02 ENCOUNTER — Encounter: Payer: Self-pay | Admitting: Gastroenterology

## 2018-10-02 ENCOUNTER — Encounter: Payer: Self-pay | Admitting: Family Medicine

## 2018-10-02 ENCOUNTER — Ambulatory Visit (INDEPENDENT_AMBULATORY_CARE_PROVIDER_SITE_OTHER): Payer: BC Managed Care – PPO | Admitting: Family Medicine

## 2018-10-02 ENCOUNTER — Other Ambulatory Visit: Payer: Self-pay

## 2018-10-02 VITALS — BP 159/115 | HR 88 | Temp 97.8°F | Resp 17 | Ht 66.0 in | Wt 246.0 lb

## 2018-10-02 DIAGNOSIS — Z1389 Encounter for screening for other disorder: Secondary | ICD-10-CM

## 2018-10-02 DIAGNOSIS — I10 Essential (primary) hypertension: Secondary | ICD-10-CM | POA: Diagnosis not present

## 2018-10-02 DIAGNOSIS — Z1231 Encounter for screening mammogram for malignant neoplasm of breast: Secondary | ICD-10-CM

## 2018-10-02 DIAGNOSIS — F17200 Nicotine dependence, unspecified, uncomplicated: Secondary | ICD-10-CM

## 2018-10-02 DIAGNOSIS — N183 Chronic kidney disease, stage 3 unspecified: Secondary | ICD-10-CM

## 2018-10-02 DIAGNOSIS — Z130 Encounter for screening for diseases of the blood and blood-forming organs and certain disorders involving the immune mechanism: Secondary | ICD-10-CM

## 2018-10-02 DIAGNOSIS — Z131 Encounter for screening for diabetes mellitus: Secondary | ICD-10-CM

## 2018-10-02 DIAGNOSIS — Z0001 Encounter for general adult medical examination with abnormal findings: Secondary | ICD-10-CM

## 2018-10-02 DIAGNOSIS — F17210 Nicotine dependence, cigarettes, uncomplicated: Secondary | ICD-10-CM

## 2018-10-02 DIAGNOSIS — Z0000 Encounter for general adult medical examination without abnormal findings: Secondary | ICD-10-CM

## 2018-10-02 DIAGNOSIS — Z1321 Encounter for screening for nutritional disorder: Secondary | ICD-10-CM

## 2018-10-02 DIAGNOSIS — Z1211 Encounter for screening for malignant neoplasm of colon: Secondary | ICD-10-CM

## 2018-10-02 DIAGNOSIS — Z1329 Encounter for screening for other suspected endocrine disorder: Secondary | ICD-10-CM

## 2018-10-02 DIAGNOSIS — Z13220 Encounter for screening for lipoid disorders: Secondary | ICD-10-CM

## 2018-10-02 LAB — POCT URINALYSIS DIP (CLINITEK)
Bilirubin, UA: NEGATIVE
Glucose, UA: NEGATIVE mg/dL
Ketones, POC UA: NEGATIVE mg/dL
Leukocytes, UA: NEGATIVE
Nitrite, UA: NEGATIVE
POC PROTEIN,UA: 100 — AB
Spec Grav, UA: 1.02 (ref 1.010–1.025)
Urobilinogen, UA: 0.2 E.U./dL
pH, UA: 7 (ref 5.0–8.0)

## 2018-10-02 MED ORDER — AMLODIPINE BESYLATE 10 MG PO TABS: 10.0000 mg | ORAL_TABLET | Freq: Every day | ORAL | 1 refills | Status: DC

## 2018-10-02 MED ORDER — LISINOPRIL 20 MG PO TABS: 20.0000 mg | ORAL_TABLET | Freq: Every day | ORAL | 1 refills | Status: DC

## 2018-10-02 MED ORDER — VARENICLINE TARTRATE 1 MG PO TABS: 1.0000 mg | ORAL_TABLET | Freq: Two times a day (BID) | ORAL | 1 refills | Status: DC

## 2018-10-02 NOTE — Patient Instructions (Addendum)
To schedule your breast exam, please call [facility]  at   Phone: [phone]     Keeping You Healthy  Get These Tests  Blood Pressure- Have your blood pressure checked by your healthcare provider at least once a year.  Normal blood pressure is 120/80.  Weight- Have your body mass index (BMI) calculated to screen for obesity.  BMI is a measure of body fat based on height and weight.  You can calculate your own BMI at [employer]  Cholesterol- Have your cholesterol checked every year.  Diabetes- Have your blood sugar checked every year if you have high blood pressure, high cholesterol, a family history of diabetes or if you are overweight.  Pap Test - Have a pap test every 1 to 5 years if you have been sexually active.  If you are older than 65 and recent pap tests have been normal you may not need additional pap tests.  In addition, if you have had a hysterectomy  for benign disease additional pap tests are not necessary.  Mammogram-Yearly mammograms are essential for early detection of breast cancer  Screening for Colon Cancer- Colonoscopy starting at age 53. Screening may begin sooner depending on your family history and other health conditions.  Follow up colonoscopy as directed by your Gastroenterologist.  Screening for Osteoporosis- Screening begins at age 52 with bone density scanning, sooner if you are at higher risk for developing Osteoporosis.  Get these medicines  Calcium with Vitamin D- Your body requires 1200-1500 mg of Calcium a day and [phone] IU of Vitamin D a day.  You can only absorb 500 mg of Calcium at a time therefore Calcium must be taken in 2 or 3 separate doses throughout the day.  Hormones- Hormone therapy has been associated with increased risk for certain cancers and heart disease.  Talk to your healthcare provider about if you need relief from menopausal symptoms.  Aspirin- Ask your healthcare provider about taking Aspirin to prevent  Heart Disease and Stroke.  Get these Immuniztions  Flu shot- Every fall  Pneumonia shot- Once after the age of 34; if you are younger ask your healthcare provider if you need a pneumonia shot.  Tetanus- Every ten years.  Zostavax- Once after the age of 44 to prevent shingles.  Take these steps  Don't smoke- Your healthcare provider can help you quit. For tips on how to quit, ask your healthcare provider or go to www.smokefree.gov or call 1-800 QUIT-NOW.  Be physically active- Exercise 5 days a week for a minimum of 30 minutes.  If you are not already physically active, start slow and gradually work up to 30 minutes of moderate physical activity.  Try walking, dancing, bike riding, swimming, etc.  Eat a healthy diet- Eat a variety of healthy foods such as fruits, vegetables, whole grains, low fat milk, low fat cheeses, yogurt, lean meats, chicken, fish, eggs, dried beans, tofu, etc.  For more information go to www.thenutritionsource.org  Dental visit- Brush and floss teeth twice daily; visit your dentist twice a year.  Eye exam- Visit your Optometrist or Ophthalmologist yearly.  Drink alcohol in moderation- Limit alcohol intake to one drink or less a day.  Never drink and drive.  Depression- Your emotional health is as important as your physical health.  If you're feeling down or losing interest in things you normally enjoy, please talk to your healthcare provider.  Seat Belts- can save your life; always wear one  Smoke/Carbon Monoxide detectors- These detectors need  to be installed on the appropriate level of your home.  Replace batteries at least once a year.  Violence- If anyone is threatening or hurting you, please tell your healthcare provider.  Living Will/ Health care power of attorney- Discuss with your healthcare provider and family.

## 2018-10-02 NOTE — Progress Notes (Signed)
Patient ID: Kristin Burnett, female    DOB: January 04, 1966, 53 y.o.   MRN: [id number]  PCP: Scot Jun, FNP  Chief Complaint  Patient presents with  . Annual Exam    Subjective:  HPI Kristin Burnett is a 53 y.o. female, current daily smoker presents for complete physical exam.  Kristin Burnett has LEIOMYOMA OF UTERUS, UNSPECIFIED; ANEMIA, HYPOCHROMIC; Essential hypertension, benign; Recurrent UTI, h/o urethra diverticuli; DYSFUNCTIONAL UTERINE BLEEDING; Annual physical exam; Edema of   legs, L>R; GERD (gastroesophageal reflux disease); Bronchospasm; Anxiety and depression; Phlebitis of arm; Urethra, diverticulum; FOM (frequency of micturition); Pneumonia of both lungs due to infectious organism; Tachypnea; and Pneumonia on their problem list.  Kristin Burnett family history includes Anemia in her mother; Arthritis in her mother; Cancer in her mother; Stg 1 colon cancer, mother. Diabetes in her father; Heart disease in her brother and father; Heart failure in her brother and father; Hypertension in her father and mother; Leukemia in her maternal grandmother; Stroke (age of onset: 53) in her father.  Health Promotion: Routine physical activity: No exercise at present. Current QQV:ZDGL mass index is 39.71 kg/m. Two meals per day. Snack mostly throughout the day. Skips lunch. Limits sodium. Recently prescribed Chantix for smoking cessation.  Patient has been unable to quit smoking.  She reports she has been out of work since March and is mostly sedentary during the day which increases her desire to smoke.  She is trying to find a part-time job or other activities to reduce boredom and frequency of smoking.  She endorses a chronic smoker's cough which is nonproductive and occurs intermittently.  She denies shortness of breath or chest tightness associated with smoking.  Patient has a history of bilateral lobe pneumonia. No formal diagnosis of COPD.  Dietary habits: Health Screening  Current/Overdue:   Immunizations: Completed  PAP:  Not indicated as  Mammogram: Overdue Colonscopy-Mother had cancerous polyp removed last year.  Patient with family history of colon cancer requires a colonoscopy and is not eligible for Cologuard.  Last Dental Exam: No recent dental  Last Eye Exam:  Last year 2019  Family History  Problem Relation Age of Onset  . Hypertension Mother   . Arthritis Mother   . Anemia Mother   . Cancer Mother        uterine?  . Diabetes Father   . Stroke Father 56  . Heart disease Father        has a defib  . Hypertension Father   . Heart failure Father   . Heart disease Brother   . Heart failure Brother   . Leukemia Maternal Grandmother   . Colon cancer Neg Hx   . Breast cancer Neg Hx      Current home medications include: Prior to Admission medications   Medication Sig Start Date End Date Taking? Authorizing Provider  amLODipine (NORVASC) 10 MG tablet Take 1 tablet by mouth once daily 09/25/18   Scot Jun, FNP  fluticasone [facility]) 50 MCG/ACT nasal spray Place 1 spray into both nostrils daily. 06/19/18   Zigmund Gottron, NP  lisinopril (ZESTRIL) 20 MG tablet Take 1 tablet by mouth once daily 09/25/18   Scot Jun, FNP  varenicline (CHANTIX CONTINUING MONTH PAK) 1 MG tablet Take 1 tablet (1 mg total) by mouth 2 (two) times daily. 07/02/18   Scot Jun, FNP  varenicline (CHANTIX STARTING MONTH PAK) 0.5 MG X 11 & 1 MG X 42 tablet Take one 0.5  mg tablet by mouth once daily for 3 days, increase  0.5 mg tablet twice daily for 4 days, increase 1 mg tablet twice daily. 07/02/18   Scot Jun, FNP    No Known Allergies  Social History   Socioeconomic History  . Marital status: Divorced    Spouse name: Not on file  . Number of children: 3  . Years of education: Not on file  . Highest education level: Not on file  Occupational History  . Occupation: bus Education administrator: [employer]  .  Financial resource strain: Not on file  . Food insecurity:    Worry: Not on file    Inability: Not on file  . Transportation needs:    Medical: Not on file    Non-medical: Not on file  Tobacco Use  . Smoking status: Current Some Day Smoker    Packs/day: 1.00    Years: 36.00    Pack years: 36.00    Types: Cigarettes    Start date: 06/21/1986  . Smokeless tobacco: Never Used  . Tobacco comment: a pack will last 3-4 days--05/30/17  Substance and Sexual Activity  . Alcohol use: No    Alcohol/week: 0.0 standard drinks  . Drug use: No  . Sexual activity: Not Currently    Birth control/protection: Surgical  Lifestyle  . Physical activity:    Days per week: Not on file    Minutes per session: Not on file  . Stress: Not on file  Relationships  . Social connections:    Talks on phone: Not on file    Gets together: Not on file    Attends religious service: Not on file    Active member of club or organization: Not on file    Attends meetings of clubs or organizations: Not on file    Relationship status: Not on file  . Intimate partner violence:    Fear of current or ex partner: Not on file    Emotionally abused: Not on file    Physically abused: Not on file    Forced sexual activity: Not on file  Other Topics Concern  . Not on file  Social History Narrative   Household: pt and 2 children   Regular exercise: was; not been in 1 mth   Caffeine use: coffee daily    Review of Systems Pertinent negatives listed in HPI Past Medical, Surgical Family and Social History reviewed and updated.  Objective:   Today's Vitals   10/02/18 0901  BP: (!) 159/115  Pulse: 88  Resp: 17  Temp: 97.8 F (36.6 C)  TempSrc: Temporal  SpO2: 94%  Weight: 246 lb (111.6 kg)  Height: 5\' 6"  (1.676 m)   Wt Readings from Last 3 Encounters:  07/02/18 237 lb 9.6 oz (107.8 kg)  05/30/17 247 lb (112 kg)  05/09/17 253 lb (114.8 kg)    Physical Exam General appearance: alert, well developed, well  nourished, cooperative and in no distress Head: Normocephalic, without obvious abnormality, atraumatic Respiratory: Respirations even and unlabored, normal respiratory rate Heart: rate and rhythm normal. No gallop or murmurs noted on exam  Abdomen: BS +, no distention, no rebound tenderness, or no mass Extremities: No gross deformities Skin: Skin color, texture, turgor normal. No rashes seen  Psych: Appropriate mood and affect. Neurologic: Mental status: Alert, oriented to person, place, and time, thought content appropriate.   Assessment & Plan:  1. Annual physical exam -Age-appropriate anticipatory guidance provided  2. Screening  for blood or protein in urine - POCT URINALYSIS DIP (CLINITEK)  3. Essential hypertension, uncontrolled today.  Patient has been without medication for several weeks and reports only resuming medication 3 days ago as she had ran out of medicine.  Blood pressure remains elevated today.  We will have patient take medication consistently daily over the course of the next 4 weeks and return for blood pressure recheck.  If blood pressure remains elevated will titrate lisinopril to max dose and continue to monitor home readings.  Urine sample today is positive for protein which is likely secondary to chronically uncontrolled hypertension. Checking the following lab: - Comprehensive metabolic panel Continue  - lisinopril (ZESTRIL) 20 MG tablet and  amLODipine (NORVASC) 10 MG tablet  4. Tobacco use disorder Smoking cessation instruction/counseling given:  counseled patient on the dangers of tobacco use, advised patient to stop smoking, and reviewed strategies to maximize success -Continue varenicline (CHANTIX CONTINUING MONTH PAK) 1 MG tablet;   5. Screening for hyperlipidemia - Lipid panel  6. Screening for colon cancer -Due to family history of cancerous colon polyp patient will need a colonoscopy.  Patient has been referred to [facility] for colon   7. Screening  for diabetes mellitus - Hemoglobin A1c  8. Encounter for vitamin deficiency screening - VITAMIN D 25 Hydroxy (Vit-D Deficiency, Fractures)  9. Breast cancer screening by mammogram - MM 3D SCREEN BREAST BILATERAL; Future  10. Thyroid disorder screen - TSH  11. Screening for deficiency anemia - CBC with Differential    Meds ordered this encounter  Medications  . lisinopril (ZESTRIL) 20 MG tablet    Sig: Take 1 tablet (20 mg total) by mouth daily.    Dispense:  90 tablet    Refill:  1  . amLODipine (NORVASC) 10 MG tablet    Sig: Take 1 tablet (10 mg total) by mouth daily.    Dispense:  90 tablet    Refill:  1  . varenicline (CHANTIX CONTINUING MONTH PAK) 1 MG tablet    Sig: Take 1 tablet (1 mg total) by mouth 2 (two) times daily.    Dispense:  90 tablet    Refill:  1    Orders Placed This Encounter  Procedures  . MM 3D SCREEN BREAST BILATERAL    Standing Status:   Future    Standing Expiration Date:   12/02/2019    Order Specific Question:   Reason for Exam (SYMPTOM  OR DIAGNOSIS REQUIRED)    Answer:   routine breast cancer screening    Order Specific Question:   Is the patient pregnant?    Answer:   No    Order Specific Question:   Preferred imaging location?    Answer:   [facility]  . Comprehensive metabolic panel    Order Specific Question:   Has the patient fasted?    Answer:   No  . CBC with Differential  . TSH  . Hemoglobin A1c  . Lipid panel    Order Specific Question:   Has the patient fasted?    Answer:   No  . VITAMIN D 25 Hydroxy (Vit-D Deficiency, Fractures)  . Ambulatory referral to Gastroenterology    Referral Priority:   Routine    Referral Type:   Consultation    Referral Reason:   Specialty Services Required    Number of Visits Requested:   1  . POCT URINALYSIS DIP (CLINITEK)    Molli Barrows, FNP-C Primary Care at [facility] [address]  [place], [place] [phone]: [phone]

## 2018-10-03 LAB — COMPREHENSIVE METABOLIC PANEL
ALT: 23 IU/L (ref 0–32)
AST: 26 IU/L (ref 0–40)
Albumin/Globulin Ratio: 1.2 (ref 1.2–2.2)
Albumin: 4.1 g/dL (ref 3.8–4.8)
Alkaline Phosphatase: 262 IU/L — ABNORMAL HIGH (ref 39–117)
BUN/Creatinine Ratio: 13 (ref 9–23)
BUN: 17 mg/dL (ref 6–24)
Bilirubin Total: 0.3 mg/dL (ref 0.0–1.2)
CO2: 18 mmol/L — ABNORMAL LOW (ref 20–29)
Calcium: 9.4 mg/dL (ref 8.7–10.2)
Chloride: 107 mmol/L — ABNORMAL HIGH (ref 96–106)
Creatinine, Ser: 1.33 mg/dL — ABNORMAL HIGH (ref 0.57–1.00)
GFR calc Af Amer: 54 mL/min/{1.73_m2} — ABNORMAL LOW (ref >59)
GFR calc non Af Amer: 47 mL/min/{1.73_m2} — ABNORMAL LOW (ref >59)
Globulin, Total: 3.4 g/dL (ref 1.5–4.5)
Glucose: 81 mg/dL (ref 65–99)
Potassium: 4.6 mmol/L (ref 3.5–5.2)
Sodium: 139 mmol/L (ref 134–144)
Total Protein: 7.5 g/dL (ref 6.0–8.5)

## 2018-10-03 LAB — CBC WITH DIFFERENTIAL/PLATELET
Basophils Absolute: 0.1 10*3/uL (ref 0.0–0.2)
Basos: 1 %
EOS (ABSOLUTE): 0.7 10*3/uL — ABNORMAL HIGH (ref 0.0–0.4)
Eos: 6 %
Hematocrit: 39.5 % (ref 34.0–46.6)
Hemoglobin: 12.9 g/dL (ref 11.1–15.9)
Immature Grans (Abs): 0.1 10*3/uL (ref 0.0–0.1)
Immature Granulocytes: 1 %
Lymphocytes Absolute: 2.5 10*3/uL (ref 0.7–3.1)
Lymphs: 24 %
MCH: 28.5 pg (ref 26.6–33.0)
MCHC: 32.7 g/dL (ref 31.5–35.7)
MCV: 87 fL (ref 79–97)
Monocytes Absolute: 0.5 10*3/uL (ref 0.1–0.9)
Monocytes: 5 %
Neutrophils Absolute: 6.6 10*3/uL (ref 1.4–7.0)
Neutrophils: 63 %
Platelets: 271 10*3/uL (ref 150–450)
RBC: 4.53 x10E6/uL (ref 3.77–5.28)
RDW: 13.1 % (ref 11.7–15.4)
WBC: 10.4 10*3/uL (ref 3.4–10.8)

## 2018-10-03 LAB — LIPID PANEL
Chol/HDL Ratio: 4 ratio (ref 0.0–4.4)
Cholesterol, Total: 175 mg/dL (ref 100–199)
HDL: 44 mg/dL (ref >39)
LDL Calculated: 107 mg/dL — ABNORMAL HIGH (ref 0–99)
Triglycerides: 122 mg/dL (ref 0–149)
VLDL Cholesterol Cal: 24 mg/dL (ref 5–40)

## 2018-10-03 LAB — TSH: TSH: 1.34 u[IU]/mL (ref 0.450–4.500)

## 2018-10-03 LAB — VITAMIN D 25 HYDROXY (VIT D DEFICIENCY, FRACTURES): Vit D, 25-Hydroxy: 7 ng/mL — ABNORMAL LOW (ref 30.0–100.0)

## 2018-10-03 LAB — HEMOGLOBIN A1C
Est. average glucose Bld gHb Est-mCnc: 100 mg/dL
Hgb A1c MFr Bld: 5.1 % (ref 4.8–5.6)

## 2018-10-08 ENCOUNTER — Other Ambulatory Visit: Payer: Self-pay | Admitting: Family Medicine

## 2018-10-08 NOTE — Telephone Encounter (Signed)
Recent labs reflect abnormal renal function. This is consistent from prior labs over 1 year ago. I will refer to nephrology if patient is not already established.  Renal function requires monitoring. Encouraged smoking cessation as this can contribute to high blood pressure which can result in worsening kidney function. Vitamin D level is low-replace with ergocalciferol 50,000 units once weekly Cholesterol is elevated (bad cholesterol_ given history of hypertension, current weight, and smoking, she is at an 18.7% of cardiovascular event. Recommend statin therapy for prevention atorvastatin 20 mg once daily

## 2018-10-08 NOTE — Telephone Encounter (Signed)
Left voice mail to call back 

## 2018-10-08 NOTE — Addendum Note (Signed)
Addended by: Scot Jun on: 10/08/2018 08:48 AM   Modules accepted: Orders

## 2018-10-09 MED ORDER — ATORVASTATIN CALCIUM 20 MG PO TABS: 20.0000 mg | ORAL_TABLET | Freq: Every day | ORAL | 3 refills | Status: DC

## 2018-10-09 MED ORDER — VITAMIN D (ERGOCALCIFEROL) 1.25 MG (50000 UNIT) PO CAPS: 50000.0000 [IU] | ORAL_CAPSULE | ORAL | 0 refills | Status: DC

## 2018-10-09 NOTE — Telephone Encounter (Signed)
Notified patient of lab results & recommendations. Expressed understanding. Is agreeable to starting both suggested medications. Prescriptions sent to pharmacy on file.  Patient states that she has never been seen by Nephrology but is agreeable to being seen for further evaluation of abnormal renal function.

## 2018-10-09 NOTE — Telephone Encounter (Signed)
Patient called back. Please call her back when you get a chance

## 2018-10-31 ENCOUNTER — Other Ambulatory Visit: Payer: Self-pay | Admitting: Family Medicine

## 2018-10-31 DIAGNOSIS — Z1231 Encounter for screening mammogram for malignant neoplasm of breast: Secondary | ICD-10-CM

## 2018-11-01 ENCOUNTER — Encounter: Payer: Self-pay | Admitting: Gastroenterology

## 2018-11-01 ENCOUNTER — Ambulatory Visit (AMBULATORY_SURGERY_CENTER): Payer: Self-pay | Admitting: *Deleted

## 2018-11-01 ENCOUNTER — Other Ambulatory Visit: Payer: Self-pay

## 2018-11-01 VITALS — Ht 64.0 in | Wt 250.0 lb

## 2018-11-01 DIAGNOSIS — Z1211 Encounter for screening for malignant neoplasm of colon: Secondary | ICD-10-CM

## 2018-11-01 DIAGNOSIS — Z800 Family history of malignant neoplasm of digestive organs: Secondary | ICD-10-CM

## 2018-11-01 MED ORDER — NA SULFATE-K SULFATE-MG SULF 17.5-3.13-1.6 GM/177ML PO SOLN: ORAL | 0 refills | Status: DC

## 2018-11-01 NOTE — Progress Notes (Signed)
Patient's pre-visit was done today over the phone with the patient due to COVID-19 pandemic. Name,DOB and address verified. Insurance verified. Packet of Prep instructions mailed to patient including copy of a consent form and pre-procedure patient acknowledgement form-pt is aware. Suprep  Coupon $15 included. Patient understands to call us back with any questions or concerns. Patient denies any allergies to eggs or soy. Patient denies any problems with anesthesia/sedation. Patient denies any oxygen use at home. Patient denies taking any diet/weight loss medications or blood thinners. EMMI education assisgned to patient on colonoscopy, this was explained and instructions given to patient. Pt is aware that care partner will wait in the car during procedure; if they feel like they will be too hot to wait in the car; they may wait in the lobby.  We want them to wear a mask (we do not have any that we can provide them), practice social distancing, and we will check their temperatures when they get here.  I did remind patient that their care partner needs to stay in the parking lot the entire time. Pt will wear mask into building.

## 2018-11-06 ENCOUNTER — Ambulatory Visit: Payer: BC Managed Care – PPO | Admitting: Family Medicine

## 2018-11-14 ENCOUNTER — Telehealth: Payer: Self-pay | Admitting: Gastroenterology

## 2018-11-14 NOTE — Telephone Encounter (Signed)

## 2018-11-15 ENCOUNTER — Ambulatory Visit (AMBULATORY_SURGERY_CENTER): Payer: BC Managed Care – PPO | Admitting: Gastroenterology

## 2018-11-15 ENCOUNTER — Encounter: Payer: Self-pay | Admitting: Gastroenterology

## 2018-11-15 ENCOUNTER — Other Ambulatory Visit: Payer: Self-pay

## 2018-11-15 VITALS — BP 114/77 | HR 69 | Temp 98.3°F | Resp 13 | Ht 64.0 in | Wt 250.0 lb

## 2018-11-15 DIAGNOSIS — Z1211 Encounter for screening for malignant neoplasm of colon: Secondary | ICD-10-CM | POA: Diagnosis not present

## 2018-11-15 DIAGNOSIS — Z800 Family history of malignant neoplasm of digestive organs: Secondary | ICD-10-CM | POA: Diagnosis not present

## 2018-11-15 MED ORDER — SODIUM CHLORIDE 0.9 % IV SOLN: 500.0000 mL | Freq: Once | INTRAVENOUS | Status: DC

## 2018-11-15 NOTE — Patient Instructions (Signed)
YOU HAD AN ENDOSCOPIC PROCEDURE TODAY AT THE [facility]:   Refer to the procedure report that was given to you for any specific questions about what was found during the examination.  If the procedure report does not answer your questions, please call your gastroenterologist to clarify.  If you requested that your care partner not be given the details of your procedure findings, then the procedure report has been included in a sealed envelope for you to review at your convenience later.  YOU SHOULD EXPECT: Some feelings of bloating in the abdomen. Passage of more gas than usual.  Walking can help get rid of the air that was put into your GI tract during the procedure and reduce the bloating. If you had a lower endoscopy (such as a colonoscopy or flexible sigmoidoscopy) you may notice spotting of blood in your stool or on the toilet paper. If you underwent a bowel prep for your procedure, you may not have a normal bowel movement for a few days.  Please Note:  You might notice some irritation and congestion in your nose or some drainage.  This is from the oxygen used during your procedure.  There is no need for concern and it should clear up in a day or so.  SYMPTOMS TO REPORT IMMEDIATELY:   Following lower endoscopy (colonoscopy or flexible sigmoidoscopy):  Excessive amounts of blood in the stool  Significant tenderness or worsening of abdominal pains  Swelling of the abdomen that is new, acute  Fever of 100F or higher  For urgent or emergent issues, a gastroenterologist can be reached at any hour by calling [phone].   DIET:  We do recommend a small meal at first, but then you may proceed to your regular diet.  Drink plenty of fluids but you should avoid alcoholic beverages for 24 hours.  ACTIVITY:  You should plan to take it easy for the rest of today and you should NOT DRIVE or use heavy machinery until tomorrow (because of the sedation medicines used during the test).     FOLLOW UP: Our staff will call the number listed on your records 48-72 hours following your procedure to check on you and address any questions or concerns that you may have regarding the information given to you following your procedure. If we do not reach you, we will leave a message.  We will attempt to reach you two times.  During this call, we will ask if you have developed any symptoms of COVID 19. If you develop any symptoms (ie: fever, flu-like symptoms, shortness of breath, cough etc.) before then, please call [phone].  If you test positive for Covid 19 in the 2 weeks post procedure, please call and report this information to us.    If any biopsies were taken you will be contacted by phone or by letter within the next 1-3 weeks.  Please call us at [phone] if you have not heard about the biopsies in 3 weeks.    SIGNATURES/CONFIDENTIALITY: You and/or your care partner have signed paperwork which will be entered into your electronic medical record.  These signatures attest to the fact that that the information above on your After Visit Summary has been reviewed and is understood.  Full responsibility of the confidentiality of this discharge information lies with you and/or your care-partner. 

## 2018-11-15 NOTE — Op Note (Signed)
[facility] Patient Name: Kristin Burnett Procedure Date: 11/15/2018 10:52 AM MRN: [id number] Endoscopist: Cane Savannah. Loletha Burnett , MD Age: 53 Referring MD:  Date of Birth: Apr 07, 1966 Gender: Female Account #: [id number] Procedure:                Colonoscopy Indications:              Colon cancer screening in patient at increased                            risk: Colorectal cancer in mother Medicines:                Monitored Anesthesia Care Procedure:                Pre-Anesthesia Assessment:                           - Prior to the procedure, a History and Physical                            was performed, and patient medications and                            allergies were reviewed. The patient's tolerance of                            previous anesthesia was also reviewed. The risks                            and benefits of the procedure and the sedation                            options and risks were discussed with the patient.                            All questions were answered, and informed consent                            was obtained. Prior Anticoagulants: The patient has                            taken no previous anticoagulant or antiplatelet                            agents. ASA Grade Assessment: II - A patient with                            mild systemic disease. After reviewing the risks                            and benefits, the patient was deemed in                            satisfactory condition to undergo the procedure.  After obtaining informed consent, the colonoscope                            was passed under direct vision. Throughout the                            procedure, the patient's blood pressure, pulse, and                            oxygen saturations were monitored continuously. The                            Colonoscope was introduced through the anus and                            advanced to the the cecum,  identified by                            appendiceal orifice and ileocecal valve. The                            colonoscopy was performed without difficulty. The                            patient tolerated the procedure well. The quality                            of the bowel preparation was good. The ileocecal                            valve, appendiceal orifice, and rectum were                            photographed. Scope In: 11:05:06 AM Scope Out: 11:18:21 AM Scope Withdrawal Time: 0 hours 9 minutes 33 seconds  Total Procedure Duration: 0 hours 13 minutes 15 seconds  Findings:                 The perianal and digital rectal examinations were                            normal.                           The entire examined colon appeared normal on direct                            and retroflexion views. Complications:            No immediate complications. Estimated Blood Loss:     Estimated blood loss: none. Impression:               - The entire examined colon is normal on direct and                            retroflexion views.                           -  No specimens collected. Recommendation:           - Patient has a contact number available for                            emergencies. The signs and symptoms of potential                            delayed complications were discussed with the                            patient. Return to normal activities tomorrow.                            Written discharge instructions were provided to the                            patient.                           - Resume previous diet.                           - Continue present medications.                           - Repeat colonoscopy in 5 years for screening                            purposes. Kristin Fennewald L. Loletha Carrow, MD 11/15/2018 11:22:41 AM This report has been signed electronically.

## 2018-11-15 NOTE — Progress Notes (Signed)
A and O x3. Report to RN. Tolerated MAC anesthesia well.

## 2018-11-15 NOTE — Progress Notes (Signed)
Pt's states no medical or surgical changes since previsit or office visit.  Temp-courtney washington  Vital signs-Abiel Antrim

## 2018-11-19 ENCOUNTER — Telehealth: Payer: Self-pay | Admitting: *Deleted

## 2018-11-19 NOTE — Telephone Encounter (Signed)
  Follow up Call-  Call back number 11/15/2018  Post procedure Call Back phone  # [phone]  Permission to leave phone message Yes  Some recent data might be hidden    1. Have you developed a fever since your procedure? no  2.   Have you had an respiratory symptoms (SOB or cough) since your procedure? no  3.   Have you tested positive for COVID 19 since your procedure no  4.   Have you had any family members/close contacts diagnosed with the COVID 19 since your procedure?  no   If yes to any of these questions please route to Joylene John, RN and Alphonsa Gin, Therapist, sports.  Patient questions:  Do you have a fever, pain , or abdominal swelling? No. Pain Score  0 *  Have you tolerated food without any problems? Yes.    Have you been able to return to your normal activities? Yes.    Do you have any questions about your discharge instructions: Diet   No. Medications  No. Follow up visit  No.  Do you have questions or concerns about your Care? No.  Actions: * If pain score is 4 or above: No action needed, pain <4.

## 2018-12-12 ENCOUNTER — Ambulatory Visit: Payer: BC Managed Care – PPO

## 2019-01-13 ENCOUNTER — Ambulatory Visit
Admission: EM | Admit: 2019-01-13 | Discharge: 2019-01-13 | Disposition: A | Discharge status: Home / Self Care | Payer: BC Managed Care – PPO | Attending: Emergency Medicine | Admitting: Emergency Medicine

## 2019-01-13 ENCOUNTER — Ambulatory Visit (INDEPENDENT_AMBULATORY_CARE_PROVIDER_SITE_OTHER): Payer: BC Managed Care – PPO

## 2019-01-13 ENCOUNTER — Encounter: Payer: Self-pay | Admitting: Emergency Medicine

## 2019-01-13 ENCOUNTER — Other Ambulatory Visit: Payer: Self-pay

## 2019-01-13 DIAGNOSIS — I160 Hypertensive urgency: Secondary | ICD-10-CM

## 2019-01-13 DIAGNOSIS — Z1159 Encounter for screening for other viral diseases: Secondary | ICD-10-CM

## 2019-01-13 DIAGNOSIS — J069 Acute upper respiratory infection, unspecified: Secondary | ICD-10-CM

## 2019-01-13 DIAGNOSIS — F17210 Nicotine dependence, cigarettes, uncomplicated: Secondary | ICD-10-CM | POA: Diagnosis not present

## 2019-01-13 MED ORDER — BENZONATATE 100 MG PO CAPS: 100.0000 mg | ORAL_CAPSULE | Freq: Three times a day (TID) | ORAL | 0 refills | Status: DC

## 2019-01-13 MED ORDER — FLUTICASONE PROPIONATE 50 MCG/ACT NA SUSP: 1.0000 | Freq: Every day | NASAL | 0 refills | Status: DC

## 2019-01-13 NOTE — Discharge Instructions (Signed)
Important to take 2 sprays of your Flonase in each nostril daily. May also take daytime antihistamine.   Recommend increasing her water intake: Goal should be about a gallon daily. May take over-the-counter expectorants.  Benzonatate up to 3 times daily for cough. Important to avoid smoking during this time as this will exacerbate your symptoms.  Your COVID test is pending: Is important you quarantine at home until your results are back. You may take OTC Tylenol for fever and myalgias.  It is important to drink plenty of water throughout the day to stay hydrated. If you test positive and later develop severe fever, cough, or shortness of breath, it is recommended that you go to the ER for further evaluation.

## 2019-01-13 NOTE — ED Notes (Signed)
Patient able to ambulate independently  

## 2019-01-13 NOTE — ED Triage Notes (Signed)
Pt presents to [facility] for assessment of nasal congestion, post-nasal drip, cough x 1 week.  Patient states it began with sinus issues, but now she is coughing up white phlegm from time to time.  States hx of pneumonia and was concerned.

## 2019-01-13 NOTE — ED Provider Notes (Signed)
[facility]    CSN: [id number] Arrival date & time: 01/13/19  1715      History   Chief Complaint Chief Complaint  Patient presents with   Nasal Congestion   Cough    HPI Kristin Burnett is a 53 y.o. female with history of hypertension, morbid obesity, GERD presenting for 1 week course of nasal congestion, postnasal drip, cough.  Has not been taking her Flonase as previously recommended by other healthcare providers.  Has tried over-the-counter sinus medications without significant relief.  States that she has had a cough as well without known triggers/alleviators.  States that her cough is sometimes productive (clear to white phlegm) though without blood.  Patient states she had pneumonia 2 years ago and is largely concerned about this.  Of note, patient's blood pressure is elevated: Has not been taking her medications due to "being out of the routine because of COVID ".  Patient denies known sick contacts, fever, shortness of breath, chest pain, abdominal pain.  Past Medical History:  Diagnosis Date   Bronchospasm 01/09/2013   DUB (dysfunctional uterine bleeding)    GERD (gastroesophageal reflux disease)    otc tums   History of blood transfusion 2011   "related to fibrods"   Hypertension    Pneumonia ~ 2015; 04/29/2017   Recurrent UTI, h/o urethra diverticuli 10/13/2009   Qualifier: Diagnosis of  By: Stanford Scotland MD, [name]     Urethral diverticulum    Dr. Estill Dooms   Yeast vaginitis    Dr. Estill Dooms    Patient Active Problem List   Diagnosis Date Noted   Tachypnea 04/29/2017   Pneumonia 04/29/2017   Pneumonia of both lungs due to infectious organism 03/08/2017   Urethra, diverticulum 07/06/2014   FOM (frequency of micturition) 07/06/2014   Phlebitis of arm 10/20/2013   Anxiety and depression 03/05/2013   Bronchospasm 01/09/2013   Annual physical exam 09/19/2012   Edema of   legs, L>R 09/19/2012   GERD (gastroesophageal reflux disease)  09/19/2012   LEIOMYOMA OF UTERUS, UNSPECIFIED 10/13/2009   ANEMIA, HYPOCHROMIC 10/13/2009   Essential hypertension, benign 10/13/2009   Recurrent UTI, h/o urethra diverticuli 10/13/2009   DYSFUNCTIONAL UTERINE BLEEDING 10/13/2009    Past Surgical History:  Procedure Laterality Date   BILATERAL SALPINGECTOMY  03/07/2012   Procedure: BILATERAL SALPINGECTOMY;  Surgeon: Lavonia Drafts, MD;  Location: [facility];  Service: Gynecology;  Laterality: Bilateral;   CYSTOSCOPY  03/07/2012   Procedure: CYSTOSCOPY;  Surgeon: Lavonia Drafts, MD;  Location: [facility];  Service: Gynecology;  Laterality: N/A;   LASER ABLATION     "before they took my uterus out"   ROBOTIC ASSISTED TOTAL HYSTERECTOMY  03/07/2012   Procedure: ROBOTIC ASSISTED TOTAL HYSTERECTOMY;  Surgeon: Lavonia Drafts, MD;  Location: [facility];  Service: Gynecology;  Laterality: N/A;   TUBAL LIGATION     URETHRAL DIVERTICULECTOMY     VIDEO BRONCHOSCOPY Bilateral 05/01/2017   Procedure: VIDEO BRONCHOSCOPY WITH FLUORO;  Surgeon: Marshell Garfinkel, MD;  Location: [facility];  Service: Cardiopulmonary;  Laterality: Bilateral;    OB History    Gravida  3   Para  3   Term  3   Preterm      AB      Living  3     SAB      TAB      Ectopic      Multiple      Live Births  Home Medications    Prior to Admission medications   Medication Sig Start Date End Date Taking? Authorizing Provider  amLODipine (NORVASC) 10 MG tablet Take 1 tablet (10 mg total) by mouth daily. 10/02/18  Yes Scot Jun, FNP  atorvastatin (LIPITOR) 20 MG tablet Take 1 tablet (20 mg total) by mouth daily. 10/09/18  Yes Scot Jun, FNP  lisinopril-hydrochlorothiazide (ZESTORETIC) 20-25 MG tablet Take 1 tablet by mouth daily. 10/14/18  Yes [provider]  benzonatate (TESSALON) 100 MG capsule Take 1 capsule (100 mg total) by mouth every 8 (eight) hours. 01/13/19   Hall-Potvin, [place], PA-C    fluticasone (FLONASE) 50 MCG/ACT nasal spray Place 1 spray into both nostrils daily. 01/13/19   Hall-Potvin, [place], PA-C  varenicline (CHANTIX CONTINUING MONTH PAK) 1 MG tablet Take 1 tablet (1 mg total) by mouth 2 (two) times daily. Patient not taking: Reported on 11/15/2018 10/02/18   Scot Jun, FNP  Vitamin D, Ergocalciferol, (DRISDOL) 1.25 MG (50000 UT) CAPS capsule Take 1 capsule (50,000 Units total) by mouth every 7 (seven) days. 10/09/18   Scot Jun, FNP    Family History Family History  Problem Relation Age of Onset   Hypertension Mother    Arthritis Mother    Anemia Mother    Cancer Mother        uterine?   Colon cancer Mother 53   Diabetes Father    Stroke Father 38   Heart disease Father        has a defib   Hypertension Father    Heart failure Father    Heart disease Brother    Heart failure Brother    Leukemia Maternal Grandmother    Breast cancer Neg Hx    Esophageal cancer Neg Hx    Stomach cancer Neg Hx    Rectal cancer Neg Hx    Colon polyps Neg Hx     Social History Social History   Tobacco Use   Smoking status: Current Some Day Smoker    Packs/day: 1.00    Years: 36.00    Pack years: 36.00    Types: Cigarettes    Start date: 06/21/1986   Smokeless tobacco: Never Used   Tobacco comment: a pack will last 3-4 days--05/30/17  Substance Use Topics   Alcohol use: No    Alcohol/week: 0.0 standard drinks   Drug use: No     Allergies   Patient has no known allergies.   Review of Systems Review of Systems  Constitutional: Negative for fatigue and fever.  HENT: Positive for congestion and postnasal drip. Negative for dental problem, ear pain, facial swelling, hearing loss, sinus pain, sore throat, tinnitus, trouble swallowing and voice change.   Eyes: Negative for photophobia, pain, redness and visual disturbance.  Respiratory: Positive for cough. Negative for choking, chest tightness, shortness of breath,  wheezing and stridor.   Cardiovascular: Negative for chest pain and palpitations.  Gastrointestinal: Negative for abdominal pain, diarrhea, nausea and vomiting.  Musculoskeletal: Negative for arthralgias and myalgias.  Skin: Negative for rash and wound.  Neurological: Negative for dizziness, syncope and headaches.     Physical Exam Triage Vital Signs ED Triage Vitals  Enc Vitals Group     BP 01/13/19 1724 (!) 169/129     Pulse Rate 01/13/19 1724 98     Resp 01/13/19 1724 20     Temp 01/13/19 1724 97.7 F (36.5 C)     Temp Source 01/13/19 1724 Oral  SpO2 01/13/19 1724 94 %     Weight --      Height --      Head Circumference --      Peak Flow --      Pain Score 01/13/19 1726 4     Pain Loc --      Pain Edu? --      Excl. in [place]? --    No data found.  Updated Vital Signs BP (!) 169/129 (BP Location: Left Arm)    Pulse 98    Temp 97.7 F (36.5 C) (Oral)    Resp 20    LMP 02/29/2012 (Exact Date)    SpO2 94%   Visual Acuity Right Eye Distance:   Left Eye Distance:   Bilateral Distance:    Right Eye Near:   Left Eye Near:    Bilateral Near:     Physical Exam Constitutional:      General: She is not in acute distress. HENT:     Head: Normocephalic and atraumatic.     Jaw: There is normal jaw occlusion. No tenderness or pain on movement.     Right Ear: Hearing, tympanic membrane, ear canal and external ear normal. No tenderness. No mastoid tenderness.     Left Ear: Hearing, tympanic membrane, ear canal and external ear normal. No tenderness. No mastoid tenderness.     Nose: No nasal deformity, septal deviation or nasal tenderness.     Right Turbinates: Not swollen.     Left Turbinates: Not swollen.     Right Sinus: No maxillary sinus tenderness or frontal sinus tenderness.     Left Sinus: No maxillary sinus tenderness or frontal sinus tenderness.     Comments: Bilateral turbinate edema with mucosal injection    Mouth/Throat:     Lips: Pink. No lesions.     Mouth:  Mucous membranes are moist. No injury.     Pharynx: Oropharynx is clear. Uvula midline. Posterior oropharyngeal erythema present. No uvula swelling.     Comments: no tonsillar exudate or hypertrophy Eyes:     General: No scleral icterus.    Pupils: Pupils are equal, round, and reactive to light.  Neck:     Musculoskeletal: Normal range of motion and neck supple. No muscular tenderness.  Cardiovascular:     Rate and Rhythm: Normal rate and regular rhythm.     Heart sounds: No murmur. No gallop.   Pulmonary:     Effort: Pulmonary effort is normal. No respiratory distress.     Breath sounds: No wheezing or rales.     Comments: Bilateral lower lobe rhonchi that disappears after coughing Lymphadenopathy:     Cervical: No cervical adenopathy.  Skin:    General: Skin is warm.     Capillary Refill: Capillary refill takes less than 2 seconds.     Coloration: Skin is not jaundiced or pale.     Findings: No erythema or rash.  Neurological:     General: No focal deficit present.     Mental Status: She is alert and oriented to person, place, and time.      UC Treatments / Results  Labs (all labs ordered are listed, but only abnormal results are displayed) Labs Reviewed  NOVEL CORONAVIRUS, NAA    EKG   Radiology Dg Chest 2 View  Result Date: 01/13/2019 CLINICAL DATA:  Cough EXAM: CHEST - 2 VIEW COMPARISON:  05/30/2017 FINDINGS: Heart and mediastinal contours are within normal limits. No focal opacities or effusions. No acute  bony abnormality. IMPRESSION: No active cardiopulmonary disease. Electronically Signed   By: Rolm Baptise M.D.   On: 01/13/2019 18:36    Procedures Procedures (including critical care time)  Medications Ordered in UC Medications - No data to display  Initial Impression / Assessment and Plan / UC Course  I have reviewed the triage vital signs and the nursing notes.  Pertinent labs & imaging results that were available during my care of the patient were  reviewed by me and considered in my medical decision making (see chart for details).     1.  URI Chest x-ray done in office, reviewed by me and radiology: No active cardiopulmonary disease processes noted.  Reviewed findings with patient.  Will treat supportively as outlined below.  Covid test pending.  2.  Hypertensive urgency Patient asymptomatic in office.  States she has her medications taken when she gets home: Denies cost being a barrier to compliance.  Return precautions discussed, patient verbalized understanding and is agreeable to plan. Final Clinical Impressions(s) / UC Diagnoses   Final diagnoses:  Upper respiratory tract infection, unspecified type     Discharge Instructions     Important to take 2 sprays of your Flonase in each nostril daily. May also take daytime antihistamine.   Recommend increasing her water intake: Goal should be about a gallon daily. May take over-the-counter expectorants.  Benzonatate up to 3 times daily for cough. Important to avoid smoking during this time as this will exacerbate your symptoms.  Your COVID test is pending: Is important you quarantine at home until your results are back. You may take OTC Tylenol for fever and myalgias.  It is important to drink plenty of water throughout the day to stay hydrated. If you test positive and later develop severe fever, cough, or shortness of breath, it is recommended that you go to the ER for further evaluation.    ED Prescriptions    Medication Sig Dispense Auth. Provider   fluticasone (FLONASE) 50 MCG/ACT nasal spray Place 1 spray into both nostrils daily. 16 g Hall-Potvin, [place], PA-C   benzonatate (TESSALON) 100 MG capsule Take 1 capsule (100 mg total) by mouth every 8 (eight) hours. 21 capsule Hall-Potvin, [place], PA-C     PDMP not reviewed this encounter.   Hall-Potvin, [place], [place] 01/13/19 1855

## 2019-01-15 LAB — NOVEL CORONAVIRUS, NAA: SARS-CoV-2, NAA: NOT DETECTED

## 2019-04-02 ENCOUNTER — Telehealth: Payer: Self-pay

## 2019-04-02 NOTE — Telephone Encounter (Signed)
Called patient to do their pre-visit COVID screening.  Call went to voicemail. Unable to do prescreening.  

## 2019-04-03 ENCOUNTER — Encounter: Payer: Self-pay | Admitting: Family Medicine

## 2019-04-03 ENCOUNTER — Other Ambulatory Visit: Payer: Self-pay

## 2019-04-03 ENCOUNTER — Ambulatory Visit (INDEPENDENT_AMBULATORY_CARE_PROVIDER_SITE_OTHER): Payer: BC Managed Care – PPO | Admitting: Family Medicine

## 2019-04-03 VITALS — BP 141/100 | HR 90 | Temp 97.3°F | Resp 17 | Wt 245.0 lb

## 2019-04-03 DIAGNOSIS — F17210 Nicotine dependence, cigarettes, uncomplicated: Secondary | ICD-10-CM | POA: Diagnosis not present

## 2019-04-03 DIAGNOSIS — R748 Abnormal levels of other serum enzymes: Secondary | ICD-10-CM | POA: Diagnosis not present

## 2019-04-03 DIAGNOSIS — K219 Gastro-esophageal reflux disease without esophagitis: Secondary | ICD-10-CM | POA: Diagnosis not present

## 2019-04-03 DIAGNOSIS — I10 Essential (primary) hypertension: Secondary | ICD-10-CM | POA: Diagnosis not present

## 2019-04-03 DIAGNOSIS — F17200 Nicotine dependence, unspecified, uncomplicated: Secondary | ICD-10-CM

## 2019-04-03 DIAGNOSIS — J320 Chronic maxillary sinusitis: Secondary | ICD-10-CM

## 2019-04-03 DIAGNOSIS — Z6841 Body Mass Index (BMI) 40.0 and over, adult: Secondary | ICD-10-CM

## 2019-04-03 DIAGNOSIS — E669 Obesity, unspecified: Secondary | ICD-10-CM

## 2019-04-03 MED ORDER — VARENICLINE TARTRATE 1 MG PO TABS: 1.0000 mg | ORAL_TABLET | Freq: Two times a day (BID) | ORAL | 2 refills | Status: DC

## 2019-04-03 MED ORDER — LISINOPRIL-HYDROCHLOROTHIAZIDE 20-25 MG PO TABS: 1.0000 | ORAL_TABLET | Freq: Every day | ORAL | 1 refills | Status: DC

## 2019-04-03 MED ORDER — OMEPRAZOLE 40 MG PO CPDR: 40.0000 mg | DELAYED_RELEASE_CAPSULE | Freq: Every day | ORAL | 3 refills | Status: DC

## 2019-04-03 MED ORDER — AMLODIPINE BESYLATE 10 MG PO TABS: 10.0000 mg | ORAL_TABLET | Freq: Every day | ORAL | 1 refills | Status: DC

## 2019-04-03 MED ORDER — BENZONATATE 100 MG PO CAPS: 100.0000 mg | ORAL_CAPSULE | Freq: Two times a day (BID) | ORAL | 0 refills | Status: DC | PRN

## 2019-04-03 MED ORDER — ATORVASTATIN CALCIUM 20 MG PO TABS: 20.0000 mg | ORAL_TABLET | Freq: Every day | ORAL | 3 refills | Status: DC

## 2019-04-03 MED ORDER — CETIRIZINE HCL 10 MG PO TABS: 10.0000 mg | ORAL_TABLET | Freq: Every day | ORAL | 2 refills | Status: DC

## 2019-04-03 MED ORDER — FLUTICASONE PROPIONATE 50 MCG/ACT NA SUSP: 1.0000 | Freq: Every day | NASAL | 2 refills | Status: DC

## 2019-04-03 NOTE — Progress Notes (Signed)
Subjective:  Patient ID: Kristin Burnett, female    DOB: [birth year]  Age: 53 y.o. MRN: [id number]  CC: Hypertension and Hyperlipidemia   HPI Kristin Burnett is a 53 year old female with a history of hypertension, hyperlipidemia who presents today for follow-up visit.  She complains of a 4-day history of postnasal drip, cough productive of whitish to yellowish sputum and sometimes chills at night.  She denies presence of fever and has no sinus pressure.  She ran out of her Flonase which she used in the past.  She complains of heartburn and would like a medication for this.  This is associated with certain acidic foods like tomatoes but she denies abdominal pain and maintaining recumbent position after meals.  She was started on the Chantix starter pack for smoking cessation and would like prescription for continuation pack. Compliant with her antihypertensive and her statin and denies adverse effects from medications.  Past Medical History:  Diagnosis Date  . Bronchospasm 01/09/2013  . DUB (dysfunctional uterine bleeding)   . GERD (gastroesophageal reflux disease)    otc tums  . History of blood transfusion 2011   "related to fibrods"  . Hypertension   . Pneumonia ~ 2015; 04/29/2017  . Recurrent UTI, h/o urethra diverticuli 10/13/2009   Qualifier: Diagnosis of  By: Stanford Scotland MD, Jiles Garter    . Urethral diverticulum    Dr. Estill Dooms  . Yeast vaginitis    Dr. Estill Dooms    Past Surgical History:  Procedure Laterality Date  . BILATERAL SALPINGECTOMY  03/07/2012   Procedure: BILATERAL SALPINGECTOMY;  Surgeon: Lavonia Drafts, MD;  Location: [facility];  Service: Gynecology;  Laterality: Bilateral;  . CYSTOSCOPY  03/07/2012   Procedure: CYSTOSCOPY;  Surgeon: Lavonia Drafts, MD;  Location: [facility];  Service: Gynecology;  Laterality: N/A;  . LASER ABLATION     "before they took my uterus out"  . ROBOTIC ASSISTED TOTAL HYSTERECTOMY  03/07/2012   Procedure: ROBOTIC ASSISTED TOTAL  HYSTERECTOMY;  Surgeon: Lavonia Drafts, MD;  Location: [facility];  Service: Gynecology;  Laterality: N/A;  . TUBAL LIGATION    . URETHRAL DIVERTICULECTOMY    . VIDEO BRONCHOSCOPY Bilateral 05/01/2017   Procedure: VIDEO BRONCHOSCOPY WITH FLUORO;  Surgeon: Marshell Garfinkel, MD;  Location: [facility];  Service: Cardiopulmonary;  Laterality: Bilateral;    Family History  Problem Relation Age of Onset  . Hypertension Mother   . Arthritis Mother   . Anemia Mother   . Cancer Mother        uterine?  . Colon cancer Mother 72  . Diabetes Father   . Stroke Father 41  . Heart disease Father        has a defib  . Hypertension Father   . Heart failure Father   . Heart disease Brother   . Heart failure Brother   . Leukemia Maternal Grandmother   . Breast cancer Neg Hx   . Esophageal cancer Neg Hx   . Stomach cancer Neg Hx   . Rectal cancer Neg Hx   . Colon polyps Neg Hx     No Known Allergies  Outpatient Medications Prior to Visit  Medication Sig Dispense Refill  . amLODipine (NORVASC) 10 MG tablet Take 1 tablet (10 mg total) by mouth daily. 90 tablet 1  . atorvastatin (LIPITOR) 20 MG tablet Take 1 tablet (20 mg total) by mouth daily. 90 tablet 3  . lisinopril-hydrochlorothiazide (ZESTORETIC) 20-25 MG tablet Take 1 tablet by mouth daily.    . varenicline (CHANTIX CONTINUING  MONTH PAK) 1 MG tablet Take 1 tablet (1 mg total) by mouth 2 (two) times daily. (Patient not taking: Reported on 11/15/2018) 90 tablet 1   No facility-administered medications prior to visit.     ROS Review of Systems  Constitutional: Negative for activity change, appetite change and fatigue.  HENT: Negative for congestion, sinus pressure and sore throat.   Eyes: Negative for visual disturbance.  Respiratory: Negative for cough, chest tightness, shortness of breath and wheezing.   Cardiovascular: Negative for chest pain and palpitations.  Gastrointestinal: Negative for abdominal distention, abdominal pain  and constipation.  Endocrine: Negative for polydipsia.  Genitourinary: Negative for dysuria and frequency.  Musculoskeletal: Negative for arthralgias and back pain.  Skin: Negative for rash.  Neurological: Negative for tremors, light-headedness and numbness.  Hematological: Does not bruise/bleed easily.  Psychiatric/Behavioral: Negative for agitation and behavioral problems.    Objective:  BP (!) 141/100   Pulse 90   Temp (!) 97.3 F (36.3 C) (Temporal)   Resp 17   Wt 245 lb (111.1 kg)   LMP 02/29/2012 (Exact Date)   SpO2 95%   BMI 42.05 kg/m   BP/Weight 04/03/2019 01/13/2019 [id number]  Systolic BP Q000111Q [id number] [id number]  Diastolic BP 123XX123 Q000111Q 77  Wt. (Lbs) 245 - 250  BMI 42.05 - 42.91      Physical Exam Constitutional:      Appearance: She is well-developed. She is obese.  Neck:     Vascular: No JVD.  Cardiovascular:     Rate and Rhythm: Normal rate.     Heart sounds: Normal heart sounds. No murmur.  Pulmonary:     Effort: Pulmonary effort is normal.     Breath sounds: Normal breath sounds. No wheezing or rales.  Chest:     Chest wall: No tenderness.  Abdominal:     General: Bowel sounds are normal. There is no distension.     Palpations: Abdomen is soft. There is no mass.     Tenderness: There is no abdominal tenderness.  Musculoskeletal:        General: Normal range of motion.     Right lower leg: No edema.     Left lower leg: No edema.  Neurological:     Mental Status: She is alert and oriented to person, place, and time.  Psychiatric:        Mood and Affect: Mood normal.     CMP Latest Ref Rng & Units 10/02/2018 07/02/2018 05/02/2017  Glucose 65 - 99 mg/dL 81 86 92  BUN 6 - 24 mg/dL 17 15 11   Creatinine 0.57 - 1.00 mg/dL 1.33(H) 1.40(H) 1.37(H)  Sodium 134 - 144 mmol/L 139 139 135  Potassium 3.5 - 5.2 mmol/L 4.6 4.1 3.8  Chloride 96 - 106 mmol/L 107(H) 106 108  CO2 20 - 29 mmol/L 18(L) 19(L) 21(L)  Calcium 8.7 - 10.2 mg/dL 9.4 9.3 8.4(L)  Total Protein 6.0 -  8.5 g/dL 7.5 7.6 -  Total Bilirubin 0.0 - 1.2 mg/dL 0.3 0.3 -  Alkaline Phos 39 - 117 IU/L 262(H) 277(H) -  AST 0 - 40 IU/L 26 21 -  ALT 0 - 32 IU/L 23 17 -    Lipid Panel     Component Value Date/Time   CHOL 175 10/02/2018 0948   TRIG 122 10/02/2018 0948   HDL 44 10/02/2018 0948   CHOLHDL 4.0 10/02/2018 0948   LDLCALC 107 (H) 10/02/2018 0948    CBC    Component Value Date/Time   WBC  10.4 10/02/2018 0948   WBC 9.8 05/30/2017 1106   RBC 4.53 10/02/2018 0948   RBC 4.18 05/30/2017 1106   HGB 12.9 10/02/2018 0948   HGB 11.6 10/20/2014 0906   HCT 39.5 10/02/2018 0948   HCT 34.2 (L) 10/20/2014 0906   PLT 271 10/02/2018 0948   MCV 87 10/02/2018 0948   MCV 90 10/20/2014 0906   MCH 28.5 10/02/2018 0948   MCH 27.6 05/02/2017 0803   MCHC 32.7 10/02/2018 0948   MCHC 32.8 05/30/2017 1106   RDW 13.1 10/02/2018 0948   RDW 14.1 10/20/2014 0906   LYMPHSABS 2.5 10/02/2018 0948   LYMPHSABS 2.9 10/20/2014 0906   MONOABS 0.8 05/30/2017 1106   EOSABS 0.7 (H) 10/02/2018 0948   EOSABS 0.5 10/20/2014 0906   BASOSABS 0.1 10/02/2018 0948   BASOSABS 0.1 10/20/2014 0906    Lab Results  Component Value Date   HGBA1C 5.1 10/02/2018    Assessment & Plan:   1. Gastroesophageal reflux disease without esophagitis New onset Commenced on PPI And avoid recumbency up to 2 hours post meals - omeprazole (PRILOSEC) 40 MG capsule; Take 1 capsule (40 mg total) by mouth daily.  Dispense: 30 capsule; Refill: 3  2. Essential hypertension Slightly elevated above goal Encouraged to continue with lifestyle modification and will adjust regimen at next visit if BP is still elevated Counseled on blood pressure goal of less than 130/80, low-sodium, DASH diet, medication compliance, 150 minutes of moderate intensity exercise per week. Discussed medication compliance, adverse effects. - Complete Metabolic Panel with GFR - Lipid panel - amLODipine (NORVASC) 10 MG tablet; Take 1 tablet (10 mg total) by mouth  daily.  Dispense: 90 tablet; Refill: 1 - lisinopril-hydrochlorothiazide (ZESTORETIC) 20-25 MG tablet; Take 1 tablet by mouth daily.  Dispense: 90 tablet; Refill: 1  3. Chronic maxillary sinusitis - cetirizine (ZYRTEC) 10 MG tablet; Take 1 tablet (10 mg total) by mouth daily.  Dispense: 30 tablet; Refill: 2 - benzonatate (TESSALON) 100 MG capsule; Take 1 capsule (100 mg total) by mouth 2 (two) times daily as needed for cough.  Dispense: 20 capsule; Refill: 0 - fluticasone (FLONASE) 50 MCG/ACT nasal spray; Place 1 spray into both nostrils daily.  Dispense: 16 g; Refill: 2  4. Tobacco use disorder Spent 3 minutes counseling on smoking cessation, hazardous effects of smoking and need to quit and she is willing to work on quitting She completed starter pack cough Chantix; prescribed continuation pack - varenicline (CHANTIX CONTINUING MONTH PAK) 1 MG tablet; Take 1 tablet (1 mg total) by mouth 2 (two) times daily.  Dispense: 60 tablet; Refill: 2   Health Care Maintenance: Declines flu shot Meds ordered this encounter  Medications  . omeprazole (PRILOSEC) 40 MG capsule    Sig: Take 1 capsule (40 mg total) by mouth daily.    Dispense:  30 capsule    Refill:  3  . cetirizine (ZYRTEC) 10 MG tablet    Sig: Take 1 tablet (10 mg total) by mouth daily.    Dispense:  30 tablet    Refill:  2  . varenicline (CHANTIX CONTINUING MONTH PAK) 1 MG tablet    Sig: Take 1 tablet (1 mg total) by mouth 2 (two) times daily.    Dispense:  60 tablet    Refill:  2  . benzonatate (TESSALON) 100 MG capsule    Sig: Take 1 capsule (100 mg total) by mouth 2 (two) times daily as needed for cough.    Dispense:  20 capsule  Refill:  0  . fluticasone (FLONASE) 50 MCG/ACT nasal spray    Sig: Place 1 spray into both nostrils daily.    Dispense:  16 g    Refill:  2  . amLODipine (NORVASC) 10 MG tablet    Sig: Take 1 tablet (10 mg total) by mouth daily.    Dispense:  90 tablet    Refill:  1  . atorvastatin (LIPITOR)  20 MG tablet    Sig: Take 1 tablet (20 mg total) by mouth daily.    Dispense:  90 tablet    Refill:  3  . lisinopril-hydrochlorothiazide (ZESTORETIC) 20-25 MG tablet    Sig: Take 1 tablet by mouth daily.    Dispense:  90 tablet    Refill:  1    Follow-up: Return in about 3 months (around 07/02/2019) for Chronic medical conditions.       Charlott Rakes, MD, FAAFP. [facility] [place], [place]   04/03/2019, 1:22 PM

## 2019-04-03 NOTE — Patient Instructions (Signed)

## 2019-04-03 NOTE — Progress Notes (Signed)
BP/Chol follow up.  Doesn't check BP regularly. Denies chest pain, SHOB, headaches, palpitations, dizziness, lower extremity swelling.  Would like a Rx for Chantix. Took a smoking cessation class through her insurance & they prescribed the starter pack.  Declines flu shot.

## 2019-04-04 LAB — CMP14+EGFR
ALT: 34 IU/L — ABNORMAL HIGH (ref 0–32)
AST: 26 IU/L (ref 0–40)
Albumin/Globulin Ratio: 1.2 (ref 1.2–2.2)
Albumin: 4.3 g/dL (ref 3.8–4.9)
Alkaline Phosphatase: 399 IU/L — ABNORMAL HIGH (ref 39–117)
BUN/Creatinine Ratio: 12 (ref 9–23)
BUN: 17 mg/dL (ref 6–24)
Bilirubin Total: 0.5 mg/dL (ref 0.0–1.2)
CO2: 23 mmol/L (ref 20–29)
Calcium: 9.9 mg/dL (ref 8.7–10.2)
Chloride: 100 mmol/L (ref 96–106)
Creatinine, Ser: 1.45 mg/dL — ABNORMAL HIGH (ref 0.57–1.00)
GFR calc Af Amer: 48 mL/min/{1.73_m2} — ABNORMAL LOW (ref >59)
GFR calc non Af Amer: 42 mL/min/{1.73_m2} — ABNORMAL LOW (ref >59)
Globulin, Total: 3.6 g/dL (ref 1.5–4.5)
Glucose: 94 mg/dL (ref 65–99)
Potassium: 4 mmol/L (ref 3.5–5.2)
Sodium: 137 mmol/L (ref 134–144)
Total Protein: 7.9 g/dL (ref 6.0–8.5)

## 2019-04-04 LAB — LIPID PANEL
Chol/HDL Ratio: 4.3 ratio (ref 0.0–4.4)
Cholesterol, Total: 171 mg/dL (ref 100–199)
HDL: 40 mg/dL (ref >39)
LDL Chol Calc (NIH): 106 mg/dL — ABNORMAL HIGH (ref 0–99)
Triglycerides: 143 mg/dL (ref 0–149)
VLDL Cholesterol Cal: 25 mg/dL (ref 5–40)

## 2019-04-04 LAB — SPECIMEN STATUS REPORT

## 2019-04-11 ENCOUNTER — Telehealth: Payer: Self-pay

## 2019-04-11 NOTE — Telephone Encounter (Signed)
Called patient's insurance to see if CPT code [phone] requires a prior authorization. It does not.   Patient's appointment is scheduled for 04/21/2019 @ 9:15 AM. Patient can't have anything to eat or drink after midnight. If that date & time does not work for patient she can call Central Scheduling([phone]) to reschedule.  Please notify patient of appointment details.

## 2019-04-16 ENCOUNTER — Telehealth: Payer: Self-pay | Admitting: Family Medicine

## 2019-04-16 NOTE — Telephone Encounter (Signed)
Pt called to request more information about an [place] scheduled for the 28th, she is confused about why she is scheduled to go as she does not have anything wrong with her abdomen. Please follow up

## 2019-04-16 NOTE — Telephone Encounter (Signed)
Patient aware of why [place] was ordered. Patient wanted to know if it was ok to reschedule appointment.

## 2019-04-16 NOTE — Progress Notes (Signed)
Patient notified of results & recommendations. Expressed understanding.

## 2019-04-21 ENCOUNTER — Ambulatory Visit (HOSPITAL_COMMUNITY): Payer: BC Managed Care – PPO

## 2019-07-03 ENCOUNTER — Ambulatory Visit (INDEPENDENT_AMBULATORY_CARE_PROVIDER_SITE_OTHER): Payer: BC Managed Care – PPO | Admitting: Internal Medicine

## 2019-07-03 ENCOUNTER — Encounter: Payer: Self-pay | Admitting: Internal Medicine

## 2019-07-03 DIAGNOSIS — N1831 Chronic kidney disease, stage 3a: Secondary | ICD-10-CM | POA: Diagnosis not present

## 2019-07-03 DIAGNOSIS — N189 Chronic kidney disease, unspecified: Secondary | ICD-10-CM | POA: Insufficient documentation

## 2019-07-03 DIAGNOSIS — E785 Hyperlipidemia, unspecified: Secondary | ICD-10-CM | POA: Insufficient documentation

## 2019-07-03 DIAGNOSIS — Z716 Tobacco abuse counseling: Secondary | ICD-10-CM

## 2019-07-03 DIAGNOSIS — F17200 Nicotine dependence, unspecified, uncomplicated: Secondary | ICD-10-CM | POA: Diagnosis not present

## 2019-07-03 DIAGNOSIS — I10 Essential (primary) hypertension: Secondary | ICD-10-CM

## 2019-07-03 DIAGNOSIS — I129 Hypertensive chronic kidney disease with stage 1 through stage 4 chronic kidney disease, or unspecified chronic kidney disease: Secondary | ICD-10-CM | POA: Diagnosis not present

## 2019-07-03 MED ORDER — VARENICLINE TARTRATE 1 MG PO TABS: 1.0000 mg | ORAL_TABLET | Freq: Two times a day (BID) | ORAL | 2 refills | Status: DC

## 2019-07-03 NOTE — Progress Notes (Signed)
Virtual Visit via Telephone Note  I connected with Kristin Burnett, on 07/03/2019 at 10:37 AM by telephone due to the COVID-19 pandemic and verified that I am speaking with the correct person using two identifiers.   Consent: I discussed the limitations, risks, security and privacy concerns of performing an evaluation and management service by telephone and the availability of in person appointments. I also discussed with the patient that there may be a patient responsible charge related to this service. The patient expressed understanding and agreed to proceed.   Location of Patient: Home   Location of Provider: Clinic    Persons participating in Telemedicine visit: Florentina Burgeson [facility] Dr. Juleen China      History of Present Illness: Chronic HTN Disease Monitoring:  Home BP Monitoring - Does not monitor.  Chest pain- no  Dyspnea- no Headache - no  Medications: Amlodipine 10 mg, Lisinopril 20 mg, HCTZ 25 mg  Compliance- yes Lightheadedness- no  Edema- no   Tobacco Use Disorder: Has been taking Chantix for about a month. Tried this medication about a year ago but didn't finish it. Currently smoking about 1 ppd.     Past Medical History:  Diagnosis Date  . Bronchospasm 01/09/2013  . DUB (dysfunctional uterine bleeding)   . GERD (gastroesophageal reflux disease)    otc tums  . History of blood transfusion 2011   "related to fibrods"  . Hypertension   . Pneumonia ~ 2015; 04/29/2017  . Recurrent UTI, h/o urethra diverticuli 10/13/2009   Qualifier: Diagnosis of  By: Stanford Scotland MD, Jiles Garter    . Urethral diverticulum    Dr. Estill Dooms  . Yeast vaginitis    Dr. Estill Dooms   No Known Allergies  Current Outpatient Medications on File Prior to Visit  Medication Sig Dispense Refill  . amLODipine (NORVASC) 10 MG tablet Take 1 tablet (10 mg total) by mouth daily. 90 tablet 1  . atorvastatin (LIPITOR) 20 MG tablet Take 1 tablet (20 mg total) by mouth daily. 90  tablet 3  . cetirizine (ZYRTEC) 10 MG tablet Take 1 tablet (10 mg total) by mouth daily. 30 tablet 2  . fluticasone (FLONASE) 50 MCG/ACT nasal spray Place 1 spray into both nostrils daily. 16 g 2  . lisinopril-hydrochlorothiazide (ZESTORETIC) 20-25 MG tablet Take 1 tablet by mouth daily. 90 tablet 1  . omeprazole (PRILOSEC) 40 MG capsule Take 1 capsule (40 mg total) by mouth daily. 30 capsule 3  . varenicline (CHANTIX CONTINUING MONTH PAK) 1 MG tablet Take 1 tablet (1 mg total) by mouth 2 (two) times daily. 60 tablet 2   No current facility-administered medications on file prior to visit.    Observations/Objective: NAD. Speaking clearly.  Work of breathing normal.  Alert and oriented. Mood appropriate.   Assessment and Plan: 1. Essential hypertension, benign Unsure what BP trend has been as patient does not monitor. BP check will be scheduled with Jeanette Caprice, CMA. Patient asymptomatic. Continue current medication regimen.  Counseled on blood pressure goal of less than 130/80, low-sodium, DASH diet, medication compliance, 150 minutes of moderate intensity exercise per week. Discussed medication compliance, adverse effects.  2. Hyperlipidemia, unspecified hyperlipidemia type Last LDL was 106 in Dec 2020. Continue statin therapy.   3. Tobacco use disorder Patient currently smokes 1 ppd. Spent 3 minutes counseling on dangers of tobacco use and benefits of quitting. Continue Chantix therapy.  - varenicline (CHANTIX CONTINUING MONTH PAK) 1 MG tablet; Take 1 tablet (1 mg total) by mouth 2 (two) times daily.  Dispense: 60 tablet; Refill: 2  4. Stage 3a chronic kidney disease Last Cr 1.45 with GFR of 48. Patient reports she is followed by nephrologist who restarted her on her Lisinopril-HCTZ medication.    Follow Up Instructions: BP check    I discussed the assessment and treatment plan with the patient. The patient was provided an opportunity to ask questions and all were answered. The patient  agreed with the plan and demonstrated an understanding of the instructions.   The patient was advised to call back or seek an in-person evaluation if the symptoms worsen or if the condition fails to improve as anticipated.     I provided 12 minutes total of non-face-to-face time during this encounter including median intraservice time, reviewing previous notes, investigations, ordering medications, medical decision making, coordinating care and patient verbalized understanding at the end of the visit.    Phill Myron, D.O. Primary Care at [facility]  07/03/2019, 10:37 AM

## 2019-07-04 ENCOUNTER — Ambulatory Visit: Payer: BC Managed Care – PPO

## 2019-08-20 ENCOUNTER — Telehealth: Payer: Self-pay | Admitting: Internal Medicine

## 2019-08-20 NOTE — Telephone Encounter (Signed)
If patient is drinking a lot of water and not feeling like she can urinate or urinate a normal amount she should go to the urgent care or ER for concern of urinary retention.   Phill Myron, D.O. Primary Care at [facility]  08/20/2019, 1:24 PM

## 2019-08-20 NOTE — Telephone Encounter (Signed)
Pt called stat that she drank 40oz of water in a 3 hr time frame and she didn't produced enough urin job wants to know if she is ok.

## 2019-08-20 NOTE — Telephone Encounter (Signed)
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

## 2019-08-21 ENCOUNTER — Encounter: Payer: Self-pay | Admitting: Internal Medicine

## 2019-08-21 ENCOUNTER — Telehealth: Payer: BC Managed Care – PPO | Admitting: Internal Medicine

## 2019-08-21 ENCOUNTER — Telehealth (INDEPENDENT_AMBULATORY_CARE_PROVIDER_SITE_OTHER): Payer: BC Managed Care – PPO | Admitting: Internal Medicine

## 2019-08-21 DIAGNOSIS — R339 Retention of urine, unspecified: Secondary | ICD-10-CM

## 2019-08-21 DIAGNOSIS — Z029 Encounter for administrative examinations, unspecified: Secondary | ICD-10-CM

## 2019-08-21 NOTE — Progress Notes (Signed)
Virtual Visit via Telephone Note  I connected with Kristin Burnett, on 08/21/2019 at 2:34 PM by telephone due to the COVID-19 pandemic and verified that I am speaking with the correct person using two identifiers.   Consent: I discussed the limitations, risks, security and privacy concerns of performing an evaluation and management service by telephone and the availability of in person appointments. I also discussed with the patient that there may be a patient responsible charge related to this service. The patient expressed understanding and agreed to proceed.   Location of Patient: Home   Location of Provider: Clinic    Persons participating in Telemedicine visit: [facility] Dr. Juleen China      History of Present Illness: Patient has concerns about inability to produce enough urine for her drug test. Her work needs a note about this. She reports that she went to the bathroom 2 hours after arriving to work and urinated normally. About an hour later, she was informed that she needed to have a drug test. She was only able to produce about half the amount of urine needed for the test. She reports that she feels that she urinates normally. Denies any trouble initiating stream of urine or sensation of incomplete emptying.    Past Medical History:  Diagnosis Date  . Bronchospasm 01/09/2013  . DUB (dysfunctional uterine bleeding)   . GERD (gastroesophageal reflux disease)    otc tums  . History of blood transfusion 2011   "related to fibrods"  . Hypertension   . Pneumonia ~ 2015; 04/29/2017  . Recurrent UTI, h/o urethra diverticuli 10/13/2009   Qualifier: Diagnosis of  By: Stanford Scotland MD, Jiles Garter    . Urethral diverticulum    Dr. Estill Dooms  . Yeast vaginitis    Dr. Estill Dooms   No Known Allergies  Current Outpatient Medications on File Prior to Visit  Medication Sig Dispense Refill  . amLODipine (NORVASC) 10 MG tablet Take 1 tablet (10 mg total) by mouth daily. 90 tablet 1   . atorvastatin (LIPITOR) 20 MG tablet Take 1 tablet (20 mg total) by mouth daily. 90 tablet 3  . cetirizine (ZYRTEC) 10 MG tablet Take 1 tablet (10 mg total) by mouth daily. (Patient taking differently: Take 10 mg by mouth daily as needed. ) 30 tablet 2  . fluticasone (FLONASE) 50 MCG/ACT nasal spray Place 1 spray into both nostrils daily. 16 g 2  . lisinopril-hydrochlorothiazide (ZESTORETIC) 20-25 MG tablet Take 1 tablet by mouth daily. 90 tablet 1  . omeprazole (PRILOSEC) 40 MG capsule Take 1 capsule (40 mg total) by mouth daily. 30 capsule 3  . varenicline (CHANTIX CONTINUING MONTH PAK) 1 MG tablet Take 1 tablet (1 mg total) by mouth 2 (two) times daily. 60 tablet 2   No current facility-administered medications on file prior to visit.    Observations/Objective: NAD. Speaking clearly.  Work of breathing normal.  Alert and oriented. Mood appropriate.   Assessment and Plan: 1. Inability to urinate This seems related to request to urinate on demand when patient had already voided and not consumed any further liquids. She was able to urinate, just unable to produce a large enough volume likely related to the above factors. While she does have CKD, she reports no changes in urinary status or urinary retention like symptoms.   2. Encounters for administrative purpose Letter completed for her job explaining the above and asking for a re-test.    Follow Up Instructions: PRN and for routine medical care  I discussed the assessment and treatment plan with the patient. The patient was provided an opportunity to ask questions and all were answered. The patient agreed with the plan and demonstrated an understanding of the instructions.   The patient was advised to call back or seek an in-person evaluation if the symptoms worsen or if the condition fails to improve as anticipated.     I provided 12 minutes total of non-face-to-face time during this encounter including median intraservice  time, reviewing previous notes, investigations, ordering medications, medical decision making, coordinating care and patient verbalized understanding at the end of the visit.    Phill Myron, D.O. Primary Care at [facility]  08/21/2019, 2:34 PM

## 2019-09-09 ENCOUNTER — Telehealth: Payer: Self-pay

## 2019-09-09 NOTE — Telephone Encounter (Signed)
Called patient to do their pre-visit COVID screening.  Patient states that she received her 2nd Covid vaccine today & isn't feeling well. She would like to cancel appointment & reschedule at a later time.

## 2019-09-10 ENCOUNTER — Ambulatory Visit: Payer: BC Managed Care – PPO | Admitting: Internal Medicine

## 2020-01-05 ENCOUNTER — Ambulatory Visit
Admission: EM | Admit: 2020-01-05 | Discharge: 2020-01-05 | Disposition: A | Discharge status: Home / Self Care | Payer: BC Managed Care – PPO | Attending: Physician Assistant | Admitting: Physician Assistant

## 2020-01-05 ENCOUNTER — Other Ambulatory Visit: Payer: Self-pay

## 2020-01-05 DIAGNOSIS — Z1152 Encounter for screening for COVID-19: Secondary | ICD-10-CM

## 2020-01-05 DIAGNOSIS — J3489 Other specified disorders of nose and nasal sinuses: Secondary | ICD-10-CM

## 2020-01-05 DIAGNOSIS — R0981 Nasal congestion: Secondary | ICD-10-CM

## 2020-01-05 DIAGNOSIS — R059 Cough, unspecified: Secondary | ICD-10-CM

## 2020-01-05 MED ORDER — DEXAMETHASONE SODIUM PHOSPHATE 10 MG/ML IJ SOLN
10.0000 mg | Freq: Once | INTRAMUSCULAR | Status: AC
  Administered 2020-01-05: 10 mg via INTRAMUSCULAR

## 2020-01-05 NOTE — ED Provider Notes (Signed)
[facility]    CSN: [id number] Arrival date & time: 01/05/20  1002      History   Chief Complaint Chief Complaint  Patient presents with   Cough   Facial Pain    HPI Kristin Burnett is a 54 y.o. female.   54 year old female comes in for 5 day of URI symptoms. Sinus pressure, frontal headache, nasal congestion, cough, chills. Denies fever, body aches. Has had looser stools. Denies abdominal pain, nausea, vomiting. Denies shortness of breath, loss of taste/smell. On Zyrtec and flonase.      Past Medical History:  Diagnosis Date   Bronchospasm 01/09/2013   DUB (dysfunctional uterine bleeding)    GERD (gastroesophageal reflux disease)    otc tums   History of blood transfusion 2011   "related to fibrods"   Hypertension    Pneumonia ~ 2015; 04/29/2017   Recurrent UTI, h/o urethra diverticuli 10/13/2009   Qualifier: Diagnosis of  By: Stanford Scotland MD, [name]     Urethral diverticulum    Dr. Estill Dooms   Yeast vaginitis    Dr. Estill Dooms    Patient Active Problem List   Diagnosis Date Noted   Hyperlipidemia 07/03/2019   CKD (chronic kidney disease) 07/03/2019   Tachypnea 04/29/2017   Urethra, diverticulum 07/06/2014   FOM (frequency of micturition) 07/06/2014   Phlebitis of arm 10/20/2013   Anxiety and depression 03/05/2013   Bronchospasm 01/09/2013   Annual physical exam 09/19/2012   Edema of   legs, L>R 09/19/2012   GERD (gastroesophageal reflux disease) 09/19/2012   LEIOMYOMA OF UTERUS, UNSPECIFIED 10/13/2009   ANEMIA, HYPOCHROMIC 10/13/2009   Essential hypertension, benign 10/13/2009   Recurrent UTI, h/o urethra diverticuli 10/13/2009   DYSFUNCTIONAL UTERINE BLEEDING 10/13/2009    Past Surgical History:  Procedure Laterality Date   BILATERAL SALPINGECTOMY  03/07/2012   Procedure: BILATERAL SALPINGECTOMY;  Surgeon: Lavonia Drafts, MD;  Location: [facility];  Service: Gynecology;  Laterality: Bilateral;   CYSTOSCOPY  03/07/2012    Procedure: CYSTOSCOPY;  Surgeon: Lavonia Drafts, MD;  Location: [facility];  Service: Gynecology;  Laterality: N/A;   LASER ABLATION     "before they took my uterus out"   ROBOTIC ASSISTED TOTAL HYSTERECTOMY  03/07/2012   Procedure: ROBOTIC ASSISTED TOTAL HYSTERECTOMY;  Surgeon: Lavonia Drafts, MD;  Location: [facility];  Service: Gynecology;  Laterality: N/A;   TUBAL LIGATION     URETHRAL DIVERTICULECTOMY     VIDEO BRONCHOSCOPY Bilateral 05/01/2017   Procedure: VIDEO BRONCHOSCOPY WITH FLUORO;  Surgeon: Marshell Garfinkel, MD;  Location: [facility];  Service: Cardiopulmonary;  Laterality: Bilateral;    OB History    Gravida  3   Para  3   Term  3   Preterm      AB      Living  3     SAB      TAB      Ectopic      Multiple      Live Births               Home Medications    Prior to Admission medications   Medication Sig Start Date End Date Taking? Authorizing Provider  amLODipine (NORVASC) 10 MG tablet Take 1 tablet (10 mg total) by mouth daily. 04/03/19   Charlott Rakes, MD  atorvastatin (LIPITOR) 20 MG tablet Take 1 tablet (20 mg total) by mouth daily. 04/03/19   Charlott Rakes, MD  cetirizine (ZYRTEC) 10 MG tablet Take 1 tablet (10 mg total) by  mouth daily. Patient taking differently: Take 10 mg by mouth daily as needed.  04/03/19   Charlott Rakes, MD  fluticasone (FLONASE) 50 MCG/ACT nasal spray Place 1 spray into both nostrils daily. 04/03/19   Charlott Rakes, MD  lisinopril-hydrochlorothiazide (ZESTORETIC) 20-25 MG tablet Take 1 tablet by mouth daily. 04/03/19   Charlott Rakes, MD  omeprazole (PRILOSEC) 40 MG capsule Take 1 capsule (40 mg total) by mouth daily. 04/03/19   Charlott Rakes, MD  varenicline (CHANTIX CONTINUING MONTH PAK) 1 MG tablet Take 1 tablet (1 mg total) by mouth 2 (two) times daily. 07/03/19   Nicolette Bang, DO    Family History Family History  Problem Relation Age of Onset   Hypertension Mother     Arthritis Mother    Anemia Mother    Cancer Mother        uterine?   Colon cancer Mother 35   Diabetes Father    Stroke Father 84   Heart disease Father        has a defib   Hypertension Father    Heart failure Father    Heart disease Brother    Heart failure Brother    Leukemia Maternal Grandmother    Breast cancer Neg Hx    Esophageal cancer Neg Hx    Stomach cancer Neg Hx    Rectal cancer Neg Hx    Colon polyps Neg Hx     Social History Social History   Tobacco Use   Smoking status: Current Some Day Smoker    Packs/day: 1.00    Years: 36.00    Pack years: 36.00    Types: Cigarettes    Start date: 06/21/1986   Smokeless tobacco: Never Used   Tobacco comment: 1 pack/day  Vaping Use   Vaping Use: Never used  Substance Use Topics   Alcohol use: No    Alcohol/week: 0.0 standard drinks   Drug use: No     Allergies   Patient has no known allergies.   Review of Systems Review of Systems  Reason unable to perform ROS: See HPI as above.     Physical Exam Triage Vital Signs ED Triage Vitals [01/05/20 1052]  Enc Vitals Group     BP (!) 133/91     Pulse Rate 98     Resp 18     Temp 98.2 F (36.8 C)     Temp Source Oral     SpO2 100 %     Weight      Height      Head Circumference      Peak Flow      Pain Score 3     Pain Loc      Pain Edu?      Excl. in [place]?    No data found.  Updated Vital Signs BP (!) 133/91 (BP Location: Right Arm)    Pulse 98    Temp 98.2 F (36.8 C) (Oral)    Resp 18    LMP 02/29/2012 (Exact Date)    SpO2 100%   Visual Acuity Right Eye Distance:   Left Eye Distance:   Bilateral Distance:    Right Eye Near:   Left Eye Near:    Bilateral Near:     Physical Exam Constitutional:      General: She is not in acute distress.    Appearance: Normal appearance. She is well-developed. She is not ill-appearing, toxic-appearing or diaphoretic.  HENT:     Head: Normocephalic and  atraumatic.     Right Ear:  Tympanic membrane, ear canal and external ear normal. Tympanic membrane is not erythematous or bulging.     Left Ear: Tympanic membrane, ear canal and external ear normal. Tympanic membrane is not erythematous or bulging.     Nose:     Right Sinus: Maxillary sinus tenderness and frontal sinus tenderness present.     Left Sinus: Maxillary sinus tenderness and frontal sinus tenderness present.     Mouth/Throat:     Mouth: Mucous membranes are moist.     Pharynx: Oropharynx is clear. Uvula midline.  Eyes:     Conjunctiva/sclera: Conjunctivae normal.     Pupils: Pupils are equal, round, and reactive to light.  Cardiovascular:     Rate and Rhythm: Normal rate and regular rhythm.  Pulmonary:     Effort: Pulmonary effort is normal. No accessory muscle usage, prolonged expiration, respiratory distress or retractions.     Breath sounds: No decreased air movement or transmitted upper airway sounds. No decreased breath sounds.     Comments: LCTAB Musculoskeletal:     Cervical back: Normal range of motion and neck supple.  Skin:    General: Skin is warm and dry.  Neurological:     Mental Status: She is alert and oriented to person, place, and time.      UC Treatments / Results  Labs (all labs ordered are listed, but only abnormal results are displayed) Labs Reviewed  NOVEL CORONAVIRUS, NAA    EKG   Radiology No results found.  Procedures Procedures (including critical care time)  Medications Ordered in UC Medications  dexamethasone (DECADRON) injection 10 mg (has no administration in time range)    Initial Impression / Assessment and Plan / UC Course  I have reviewed the triage vital signs and the nursing notes.  Pertinent labs & imaging results that were available during my care of the patient were reviewed by me and considered in my medical decision making (see chart for details).    COVID PCR test ordered. Patient to quarantine until testing results return. No alarming  signs on exam. LCTAB. Decadron injection in office for sinus pressure/frontal headache. Other symptomatic treatment discussed.  Push fluids.  Return precautions given.  Patient expresses understanding and agrees to plan.  Final Clinical Impressions(s) / UC Diagnoses   Final diagnoses:  Encounter for screening for COVID-19  Sinus pressure  Nasal congestion  Cough    ED Prescriptions    None     PDMP not reviewed this encounter.   Ok Edwards, PA-C 01/05/20 1125

## 2020-01-05 NOTE — ED Triage Notes (Signed)
Pt present facial pain with cough, symptoms started on Thursday. Pt has been using her prescription zyrtec and Flonase with no relief

## 2020-01-05 NOTE — Discharge Instructions (Signed)
COVID PCR testing ordered. I would like you to quarantine until testing results. Decadron injection in office today. Continue flonase and add azelastine as directed.  Tylenol/motrin for pain and fever. Keep hydrated, urine should be clear to pale yellow in color. If experiencing shortness of breath, trouble breathing, go to the emergency department for further evaluation needed.

## 2020-01-07 LAB — NOVEL CORONAVIRUS, NAA: SARS-CoV-2, NAA: NOT DETECTED

## 2020-01-07 LAB — SARS-COV-2, NAA 2 DAY TAT

## 2020-01-28 ENCOUNTER — Other Ambulatory Visit: Payer: Self-pay

## 2020-01-28 ENCOUNTER — Other Ambulatory Visit (HOSPITAL_COMMUNITY)
Admission: RE | Admit: 2020-01-28 | Discharge: 2020-01-28 | Disposition: A | Discharge status: Home / Self Care | Payer: BC Managed Care – PPO | Source: Ambulatory Visit | Attending: Internal Medicine | Admitting: Internal Medicine

## 2020-01-28 ENCOUNTER — Ambulatory Visit (INDEPENDENT_AMBULATORY_CARE_PROVIDER_SITE_OTHER): Payer: BC Managed Care – PPO | Admitting: Internal Medicine

## 2020-01-28 ENCOUNTER — Encounter: Payer: Self-pay | Admitting: Internal Medicine

## 2020-01-28 VITALS — BP 109/78 | HR 96 | Temp 97.3°F | Resp 17 | Ht 64.0 in | Wt 253.0 lb

## 2020-01-28 DIAGNOSIS — Z0000 Encounter for general adult medical examination without abnormal findings: Secondary | ICD-10-CM

## 2020-01-28 DIAGNOSIS — Z113 Encounter for screening for infections with a predominantly sexual mode of transmission: Secondary | ICD-10-CM

## 2020-01-28 DIAGNOSIS — Z1231 Encounter for screening mammogram for malignant neoplasm of breast: Secondary | ICD-10-CM | POA: Diagnosis not present

## 2020-01-28 DIAGNOSIS — Z1159 Encounter for screening for other viral diseases: Secondary | ICD-10-CM | POA: Diagnosis not present

## 2020-01-28 DIAGNOSIS — N393 Stress incontinence (female) (male): Secondary | ICD-10-CM

## 2020-01-28 DIAGNOSIS — F17210 Nicotine dependence, cigarettes, uncomplicated: Secondary | ICD-10-CM

## 2020-01-28 DIAGNOSIS — K130 Diseases of lips: Secondary | ICD-10-CM

## 2020-01-28 DIAGNOSIS — J320 Chronic maxillary sinusitis: Secondary | ICD-10-CM

## 2020-01-28 DIAGNOSIS — F17200 Nicotine dependence, unspecified, uncomplicated: Secondary | ICD-10-CM

## 2020-01-28 LAB — POCT URINALYSIS DIP (CLINITEK)
Bilirubin, UA: NEGATIVE
Blood, UA: NEGATIVE
Glucose, UA: NEGATIVE mg/dL
Ketones, POC UA: NEGATIVE mg/dL
Leukocytes, UA: NEGATIVE
Nitrite, UA: NEGATIVE
Spec Grav, UA: >=1.03 — AB (ref 1.010–1.025)
Urobilinogen, UA: 1 E.U./dL
pH, UA: 5.5 (ref 5.0–8.0)

## 2020-01-28 MED ORDER — VARENICLINE TARTRATE 1 MG PO TABS: 1.0000 mg | ORAL_TABLET | Freq: Two times a day (BID) | ORAL | 2 refills | Status: DC

## 2020-01-28 MED ORDER — CETIRIZINE HCL 10 MG PO TABS: 10.0000 mg | ORAL_TABLET | Freq: Every day | ORAL | 3 refills | Status: DC | PRN

## 2020-01-28 NOTE — Progress Notes (Signed)
Subjective:    Kristin Burnett - 54 y.o. female MRN [id number]  Date of birth: 05-Jul-1965  HPI  Kristin Burnett is here for annual exam. Has concerns about a lip lesion present on left lower lip for several months. At first thought might be a fever blister but has not resolved and seems to be growing larger. Non-painful. Is a smoker.    Reports frequent incontinence when she is laughing, sneezing, coughing. Otherwise doesn't have frequency, urgency, or dysuria. No hematuria. Worried this might be a UTI but has been occurring for years. History of 3 vaginal births.     Health Maintenance:  Health Maintenance Due  Topic Date Due   Hepatitis C Screening  Never done   MAMMOGRAM  12/26/2017    -  reports that she has been smoking cigarettes. She started smoking about 33 years ago. She has a 36.00 pack-year smoking history. She has never used smokeless tobacco. - Review of Systems: Per HPI. - Past Medical History: Patient Active Problem List   Diagnosis Date Noted   Hyperlipidemia 07/03/2019   CKD (chronic kidney disease) 07/03/2019   Tachypnea 04/29/2017   Urethra, diverticulum 07/06/2014   FOM (frequency of micturition) 07/06/2014   Phlebitis of arm 10/20/2013   Anxiety and depression 03/05/2013   Bronchospasm 01/09/2013   Annual physical exam 09/19/2012   Edema of   legs, L>R 09/19/2012   GERD (gastroesophageal reflux disease) 09/19/2012   LEIOMYOMA OF UTERUS, UNSPECIFIED 10/13/2009   ANEMIA, HYPOCHROMIC 10/13/2009   Essential hypertension, benign 10/13/2009   Recurrent UTI, h/o urethra diverticuli 10/13/2009   DYSFUNCTIONAL UTERINE BLEEDING 10/13/2009   - Medications: reviewed and updated   Objective:   Physical Exam BP 109/78    Pulse 96    Temp (!) 97.3 F (36.3 C) (Temporal)    Resp 17    Ht 5\' 4"  (1.626 m)    Wt 253 lb (114.8 kg)    LMP 02/29/2012 (Exact Date)    SpO2 96%    BMI 43.43 kg/m  Physical Exam Constitutional:      Appearance: She is not  diaphoretic.  HENT:     Head: Normocephalic and atraumatic.     Mouth/Throat:     Comments: Erythematous raised lesion without smooth borders or ulceration present at left lower lip.  Eyes:     Conjunctiva/sclera: Conjunctivae normal.     Pupils: Pupils are equal, round, and reactive to light.  Neck:     Thyroid: No thyromegaly.  Cardiovascular:     Rate and Rhythm: Normal rate and regular rhythm.     Heart sounds: Normal heart sounds. No murmur heard.   Pulmonary:     Effort: Pulmonary effort is normal. No respiratory distress.     Breath sounds: Normal breath sounds. No wheezing.  Abdominal:     General: Bowel sounds are normal. There is no distension.     Palpations: Abdomen is soft.     Tenderness: There is no abdominal tenderness. There is no guarding or rebound.  Musculoskeletal:        General: No deformity. Normal range of motion.     Cervical back: Normal range of motion and neck supple.  Lymphadenopathy:     Cervical: No cervical adenopathy.  Skin:    General: Skin is warm and dry.     Findings: No rash.  Neurological:     Mental Status: She is alert and oriented to person, place, and time.     Gait: Gait is  intact.  Psychiatric:        Mood and Affect: Mood and affect normal.        Judgment: Judgment normal.            Assessment & Plan:   1. Annual physical exam Counseled on 150 minutes of exercise per week, healthy eating (including decreased daily intake of saturated fats, cholesterol, added sugars, sodium), STI prevention, routine healthcare maintenance. - CBC with Differential - Comprehensive metabolic panel  2. Breast cancer screening by mammogram - MM Digital Screening; Future  3. Screening for STDs (sexually transmitted diseases) - Cervicovaginal ancillary only - RPR - HIV antibody (with reflex)  4. Need for hepatitis C screening test - HCV Ab w/Rflx to Verification  5. Chronic maxillary sinusitis - cetirizine (ZYRTEC) 10 MG tablet;  Take 1 tablet (10 mg total) by mouth daily as needed.  Dispense: 90 tablet; Refill: 3  6. Stress incontinence of urine Will obtain urine studies to rule out infectious etiology. Suspect this is related to weakened pelvic floor muscles due to age and history of multiple vaginal deliveries. Will refer to pelvic PT.  - POCT URINALYSIS DIP (CLINITEK) - Urine Culture - Ambulatory referral to Physical Therapy  7. Lip lesion Referral to dermatology for evaluation and removal especially given smoking history and in a sun exposed area.  - Ambulatory referral to Dermatology  8. Tobacco use disorder Patient currently smokes 1 ppd. Spent 6 minutes counseling on dangers of tobacco use and benefits of quitting, offered pharmacological intervention to aid quitting and patient is ready to quit. Therapy started: Chantix.  - varenicline (CHANTIX CONTINUING MONTH PAK) 1 MG tablet; Take 1 tablet (1 mg total) by mouth 2 (two) times daily.  Dispense: 60 tablet; Refill: [place], D.O. 01/28/2020, 3:10 PM [facility]

## 2020-01-29 LAB — CBC WITH DIFFERENTIAL/PLATELET
Basophils Absolute: 0.1 10*3/uL (ref 0.0–0.2)
Basos: 1 %
EOS (ABSOLUTE): 0.6 10*3/uL — ABNORMAL HIGH (ref 0.0–0.4)
Eos: 4 %
Hematocrit: 38.9 % (ref 34.0–46.6)
Hemoglobin: 13.1 g/dL (ref 11.1–15.9)
Immature Grans (Abs): 0.1 10*3/uL (ref 0.0–0.1)
Immature Granulocytes: 1 %
Lymphocytes Absolute: 2.7 10*3/uL (ref 0.7–3.1)
Lymphs: 20 %
MCH: 29.2 pg (ref 26.6–33.0)
MCHC: 33.7 g/dL (ref 31.5–35.7)
MCV: 87 fL (ref 79–97)
Monocytes Absolute: 0.6 10*3/uL (ref 0.1–0.9)
Monocytes: 4 %
Neutrophils Absolute: 9.3 10*3/uL — ABNORMAL HIGH (ref 1.4–7.0)
Neutrophils: 70 %
Platelets: 325 10*3/uL (ref 150–450)
RBC: 4.48 x10E6/uL (ref 3.77–5.28)
RDW: 13.3 % (ref 11.7–15.4)
WBC: 13.3 10*3/uL — ABNORMAL HIGH (ref 3.4–10.8)

## 2020-01-29 LAB — COMPREHENSIVE METABOLIC PANEL
ALT: 51 IU/L — ABNORMAL HIGH (ref 0–32)
AST: 40 IU/L (ref 0–40)
Albumin/Globulin Ratio: 1.2 (ref 1.2–2.2)
Albumin: 4.4 g/dL (ref 3.8–4.9)
Alkaline Phosphatase: 428 IU/L — ABNORMAL HIGH (ref 44–121)
BUN/Creatinine Ratio: 17 (ref 9–23)
BUN: 23 mg/dL (ref 6–24)
Bilirubin Total: 0.4 mg/dL (ref 0.0–1.2)
CO2: 23 mmol/L (ref 20–29)
Calcium: 10 mg/dL (ref 8.7–10.2)
Chloride: 100 mmol/L (ref 96–106)
Creatinine, Ser: 1.35 mg/dL — ABNORMAL HIGH (ref 0.57–1.00)
GFR calc Af Amer: 52 mL/min/{1.73_m2} — ABNORMAL LOW (ref >59)
GFR calc non Af Amer: 45 mL/min/{1.73_m2} — ABNORMAL LOW (ref >59)
Globulin, Total: 3.8 g/dL (ref 1.5–4.5)
Glucose: 133 mg/dL — ABNORMAL HIGH (ref 65–99)
Potassium: 3.7 mmol/L (ref 3.5–5.2)
Sodium: 138 mmol/L (ref 134–144)
Total Protein: 8.2 g/dL (ref 6.0–8.5)

## 2020-01-29 LAB — HCV AB W/RFLX TO VERIFICATION: HCV Ab: <0.1 s/co ratio (ref 0.0–0.9)

## 2020-01-29 LAB — HCV INTERPRETATION

## 2020-01-29 LAB — RPR: RPR Ser Ql: NONREACTIVE

## 2020-01-29 LAB — HIV ANTIBODY (ROUTINE TESTING W REFLEX): HIV Screen 4th Generation wRfx: NONREACTIVE

## 2020-01-30 LAB — CERVICOVAGINAL ANCILLARY ONLY
Bacterial Vaginitis (gardnerella): POSITIVE — AB
Candida Glabrata: NEGATIVE
Candida Vaginitis: NEGATIVE
Chlamydia: NEGATIVE
Comment: NEGATIVE
Comment: NEGATIVE
Comment: NEGATIVE
Comment: NEGATIVE
Comment: NEGATIVE
Comment: NORMAL
Neisseria Gonorrhea: NEGATIVE
Trichomonas: NEGATIVE

## 2020-01-30 LAB — URINE CULTURE

## 2020-02-02 ENCOUNTER — Other Ambulatory Visit: Payer: Self-pay | Admitting: Internal Medicine

## 2020-02-02 DIAGNOSIS — R918 Other nonspecific abnormal finding of lung field: Secondary | ICD-10-CM

## 2020-02-02 DIAGNOSIS — D729 Disorder of white blood cells, unspecified: Secondary | ICD-10-CM

## 2020-02-02 MED ORDER — METRONIDAZOLE 500 MG PO TABS: 500.0000 mg | ORAL_TABLET | Freq: Two times a day (BID) | ORAL | 0 refills | Status: DC

## 2020-02-08 ENCOUNTER — Other Ambulatory Visit: Payer: Self-pay | Admitting: Family Medicine

## 2020-02-08 DIAGNOSIS — K219 Gastro-esophageal reflux disease without esophagitis: Secondary | ICD-10-CM

## 2020-02-08 NOTE — Telephone Encounter (Signed)
[facility] does not refill for this practice- routed to practice

## 2020-02-16 ENCOUNTER — Inpatient Hospital Stay: Admission: RE | Admit: 2020-02-16 | Payer: BC Managed Care – PPO | Source: Ambulatory Visit

## 2020-03-03 ENCOUNTER — Ambulatory Visit (INDEPENDENT_AMBULATORY_CARE_PROVIDER_SITE_OTHER): Payer: BC Managed Care – PPO | Admitting: Physician Assistant

## 2020-03-03 ENCOUNTER — Encounter: Payer: Self-pay | Admitting: Physician Assistant

## 2020-03-03 ENCOUNTER — Other Ambulatory Visit: Payer: Self-pay

## 2020-03-03 VITALS — BP 124/89 | HR 96 | Temp 96.2°F | Resp 18 | Ht 62.0 in | Wt 252.0 lb

## 2020-03-03 DIAGNOSIS — Z716 Tobacco abuse counseling: Secondary | ICD-10-CM

## 2020-03-03 DIAGNOSIS — F17200 Nicotine dependence, unspecified, uncomplicated: Secondary | ICD-10-CM | POA: Diagnosis not present

## 2020-03-03 MED ORDER — VARENICLINE TARTRATE 1 MG PO TABS: 1.0000 mg | ORAL_TABLET | Freq: Two times a day (BID) | ORAL | 2 refills | Status: DC

## 2020-03-03 NOTE — Progress Notes (Signed)
Established Patient Office Visit  Subjective:  Patient ID: Kristin Burnett, female    DOB: 1965/05/20  Age: 54 y.o. MRN: [id number]  CC:  Chief Complaint  Patient presents with  . Nicotine Dependence    HPI Kristin Burnett reports that she does want to become more compliant to working on smoking cessation.  Reports that she is currently smoking 1 pack/day.  Reports that she did not restart the Chantix due to pharmacy issues.  Reports that she previously was not consistent to this Chantix.  Reports that she canceled her lung cancer screening CT due to financial constraints.    Past Medical History:  Diagnosis Date  . Bronchospasm 01/09/2013  . DUB (dysfunctional uterine bleeding)   . GERD (gastroesophageal reflux disease)    otc tums  . History of blood transfusion 2011   "related to fibrods"  . Hypertension   . Pneumonia ~ 2015; 04/29/2017  . Recurrent UTI, h/o urethra diverticuli 10/13/2009   Qualifier: Diagnosis of  By: Stanford Scotland MD, Jiles Garter    . Urethral diverticulum    Dr. Estill Dooms  . Yeast vaginitis    Dr. Estill Dooms    Past Surgical History:  Procedure Laterality Date  . BILATERAL SALPINGECTOMY  03/07/2012   Procedure: BILATERAL SALPINGECTOMY;  Surgeon: Lavonia Drafts, MD;  Location: [facility];  Service: Gynecology;  Laterality: Bilateral;  . CYSTOSCOPY  03/07/2012   Procedure: CYSTOSCOPY;  Surgeon: Lavonia Drafts, MD;  Location: [facility];  Service: Gynecology;  Laterality: N/A;  . LASER ABLATION     "before they took my uterus out"  . ROBOTIC ASSISTED TOTAL HYSTERECTOMY  03/07/2012   Procedure: ROBOTIC ASSISTED TOTAL HYSTERECTOMY;  Surgeon: Lavonia Drafts, MD;  Location: [facility];  Service: Gynecology;  Laterality: N/A;  . TUBAL LIGATION    . URETHRAL DIVERTICULECTOMY    . VIDEO BRONCHOSCOPY Bilateral 05/01/2017   Procedure: VIDEO BRONCHOSCOPY WITH FLUORO;  Surgeon: Marshell Garfinkel, MD;  Location: [facility];  Service: Cardiopulmonary;  Laterality:  Bilateral;    Family History  Problem Relation Age of Onset  . Hypertension Mother   . Arthritis Mother   . Anemia Mother   . Cancer Mother        uterine?  . Colon cancer Mother 70  . Diabetes Father   . Stroke Father 84  . Heart disease Father        has a defib  . Hypertension Father   . Heart failure Father   . Heart disease Brother   . Heart failure Brother   . Leukemia Maternal Grandmother   . Breast cancer Neg Hx   . Esophageal cancer Neg Hx   . Stomach cancer Neg Hx   . Rectal cancer Neg Hx   . Colon polyps Neg Hx     Social History   Socioeconomic History  . Marital status: Divorced    Spouse name: Not on file  . Number of children: 3  . Years of education: Not on file  . Highest education level: Not on file  Occupational History  . Occupation: bus Education administrator: [employer]  Tobacco Use  . Smoking status: Current Some Day Smoker    Packs/day: 1.00    Years: 36.00    Pack years: 36.00    Types: Cigarettes    Start date: 06/21/1986  . Smokeless tobacco: Never Used  . Tobacco comment: 1 pack/day  Vaping Use  . Vaping Use: Never used  Substance and Sexual Activity  . Alcohol  use: No    Alcohol/week: 0.0 standard drinks  . Drug use: No  . Sexual activity: Not Currently    Birth control/protection: Surgical  Other Topics Concern  . Not on file  Social History Narrative   Household: pt and 2 children   Regular exercise: was; not been in 1 mth   Caffeine use: coffee daily   Social Determinants of Health   Financial Resource Strain:   . Difficulty of Paying Living Expenses: Not on file  Food Insecurity:   . Worried About Charity fundraiser in the Last Year: Not on file  . Ran Out of Food in the Last Year: Not on file  Transportation Needs:   . Lack of Transportation (Medical): Not on file  . Lack of Transportation (Non-Medical): Not on file  Physical Activity:   . Days of Exercise per Week: Not on file  . Minutes of Exercise per  Session: Not on file  Stress:   . Feeling of Stress : Not on file  Social Connections:   . Frequency of Communication with Friends and Family: Not on file  . Frequency of Social Gatherings with Friends and Family: Not on file  . Attends Religious Services: Not on file  . Active Member of Clubs or Organizations: Not on file  . Attends Archivist Meetings: Not on file  . Marital Status: Not on file  Intimate Partner Violence:   . Fear of Current or Ex-Partner: Not on file  . Emotionally Abused: Not on file  . Physically Abused: Not on file  . Sexually Abused: Not on file    Outpatient Medications Prior to Visit  Medication Sig Dispense Refill  . amLODipine (NORVASC) 10 MG tablet Take 1 tablet (10 mg total) by mouth daily. 90 tablet 1  . atorvastatin (LIPITOR) 20 MG tablet Take 1 tablet (20 mg total) by mouth daily. 90 tablet 3  . cetirizine (ZYRTEC) 10 MG tablet Take 1 tablet (10 mg total) by mouth daily as needed. 90 tablet 3  . fluticasone (FLONASE) 50 MCG/ACT nasal spray Place 1 spray into both nostrils daily. 16 g 2  . lisinopril-hydrochlorothiazide (ZESTORETIC) 20-25 MG tablet Take 1 tablet by mouth daily. 90 tablet 1  . metroNIDAZOLE (FLAGYL) 500 MG tablet Take 1 tablet (500 mg total) by mouth 2 (two) times daily. 14 tablet 0  . omeprazole (PRILOSEC) 40 MG capsule Take 1 capsule by mouth once daily 90 capsule 1  . varenicline (CHANTIX CONTINUING MONTH PAK) 1 MG tablet Take 1 tablet (1 mg total) by mouth 2 (two) times daily. 60 tablet 2   No facility-administered medications prior to visit.    No Known Allergies  ROS Review of Systems  Constitutional: Negative.   HENT: Negative.   Eyes: Negative.   Respiratory: Negative.   Cardiovascular: Negative.   Gastrointestinal: Negative.   Endocrine: Negative.   Genitourinary: Negative.   Musculoskeletal: Negative.   Skin: Negative.   Allergic/Immunologic: Negative.   Neurological: Negative.   Hematological:  Negative.   Psychiatric/Behavioral: Negative.       Objective:    Physical Exam Nursing note reviewed.  Constitutional:      General: She is not in acute distress.    Appearance: Normal appearance. She is not ill-appearing.  HENT:     Head: Normocephalic and atraumatic.     Right Ear: External ear normal.     Left Ear: External ear normal.     Nose: Nose normal.     Mouth/Throat:  Mouth: Mucous membranes are moist.     Pharynx: Oropharynx is clear.  Eyes:     Extraocular Movements: Extraocular movements intact.     Conjunctiva/sclera: Conjunctivae normal.     Pupils: Pupils are equal, round, and reactive to light.  Cardiovascular:     Rate and Rhythm: Normal rate and regular rhythm.     Pulses: Normal pulses.     Heart sounds: Normal heart sounds.  Pulmonary:     Effort: Pulmonary effort is normal.     Breath sounds: Normal breath sounds.  Abdominal:     General: Abdomen is flat.  Musculoskeletal:        General: Normal range of motion.     Cervical back: Normal range of motion and neck supple.  Skin:    General: Skin is warm and dry.  Neurological:     General: No focal deficit present.     Mental Status: She is alert and oriented to person, place, and time.  Psychiatric:        Mood and Affect: Mood normal.        Behavior: Behavior normal.        Thought Content: Thought content normal.        Judgment: Judgment normal.     BP 124/89 (BP Location: Left Arm, Patient Position: Sitting, Cuff Size: Large)   Pulse 96   Temp (!) 96.2 F (35.7 C) (Oral)   Resp 18   Ht 5\' 2"  (1.575 m)   Wt 252 lb (114.3 kg)   LMP 02/29/2012 (Exact Date)   SpO2 95%   BMI 46.09 kg/m  Wt Readings from Last 3 Encounters:  03/03/20 252 lb (114.3 kg)  01/28/20 253 lb (114.8 kg)  04/03/19 245 lb (111.1 kg)     Health Maintenance Due  Topic Date Due  . COVID-19 Vaccine (1) Never done  . MAMMOGRAM  12/26/2017    There are no preventive care reminders to display for this  patient.  Lab Results  Component Value Date   TSH 1.340 10/02/2018   Lab Results  Component Value Date   WBC 13.3 (H) 01/28/2020   HGB 13.1 01/28/2020   HCT 38.9 01/28/2020   MCV 87 01/28/2020   PLT 325 01/28/2020   Lab Results  Component Value Date   NA 138 01/28/2020   K 3.7 01/28/2020   CO2 23 01/28/2020   GLUCOSE 133 (H) 01/28/2020   BUN 23 01/28/2020   CREATININE 1.35 (H) 01/28/2020   BILITOT 0.4 01/28/2020   ALKPHOS 428 (H) 01/28/2020   AST 40 01/28/2020   ALT 51 (H) 01/28/2020   PROT 8.2 01/28/2020   ALBUMIN 4.4 01/28/2020   CALCIUM 10.0 01/28/2020   ANIONGAP 6 05/02/2017   GFR 54.13 (L) 09/07/2014   Lab Results  Component Value Date   CHOL 171 04/03/2019   Lab Results  Component Value Date   HDL 40 04/03/2019   Lab Results  Component Value Date   LDLCALC 106 (H) 04/03/2019   Lab Results  Component Value Date   TRIG 143 04/03/2019   Lab Results  Component Value Date   CHOLHDL 4.3 04/03/2019   Lab Results  Component Value Date   HGBA1C 5.1 10/02/2018      Assessment & Plan:   Problem List Items Addressed This Visit    None    Visit Diagnoses    Encounter for smoking cessation counseling    -  Primary   Tobacco use disorder  Relevant Medications   varenicline (CHANTIX CONTINUING MONTH PAK) 1 MG tablet      Meds ordered this encounter  Medications  . varenicline (CHANTIX CONTINUING MONTH PAK) 1 MG tablet    Sig: Take 1 tablet (1 mg total) by mouth 2 (two) times daily.    Dispense:  60 tablet    Refill:  2    Order Specific Question:   Supervising Provider    Answer:   WRIGHT, PATRICK E [[id number]]  1. Encounter for smoking cessation counseling Patient education given on lifestyle modifications to help with smoking cessation.  Chantix once again sent to pharmacy, encourage patient to follow-up with pharmacy as soon as possible.  Patient education given on importance of lung cancer screening due to current recommendations  Patient  requested letter for health insurance company, encourage patient to forward that to office for completion  Keep current schedule follow-up with Dr. Juleen China  2. Tobacco use disorder  - varenicline (CHANTIX CONTINUING MONTH PAK) 1 MG tablet; Take 1 tablet (1 mg total) by mouth 2 (two) times daily.  Dispense: 60 tablet; Refill: 2    I have reviewed the patient's medical history (PMH, PSH, Social History, Family History, Medications, and allergies) , and have been updated if relevant. I spent 30 minutes reviewing chart and  face to face time with patient.    Follow-up: No follow-ups on file.    Loraine Grip Mayers, PA-C

## 2020-03-03 NOTE — Patient Instructions (Signed)
Write down your list of Why's you want to quit smoking and refer back to them every day!  Set a timer when you have the urge to smoke and listen to your thoughts and talk back to them!  You got this!  Kennieth Rad, PA-C Physician Assistant [facility] [email or web address]   Steps to Quit Smoking Smoking tobacco is the leading cause of preventable death. It can affect almost every organ in the body. Smoking puts you and those around you at risk for developing many serious chronic diseases. Quitting smoking can be difficult, but it is one of the best things that you can do for your health. It is never too late to quit. How do I get ready to quit? When you decide to quit smoking, create a plan to help you succeed. Before you quit:  Pick a date to quit. Set a date within the next 2 weeks to give you time to prepare.  Write down the reasons why you are quitting. Keep this list in places where you will see it often.  Tell your family, friends, and co-workers that you are quitting. Support from your loved ones can make quitting easier.  Talk with your health care provider about your options for quitting smoking.  Find out what treatment options are covered by your health insurance.  Identify people, places, things, and activities that make you want to smoke (triggers). Avoid them. What first steps can I take to quit smoking?  Throw away all cigarettes at home, at work, and in your car.  Throw away smoking accessories, such as Scientist, research (medical).  Clean your car. Make sure to empty the ashtray.  Clean your home, including curtains and carpets. What strategies can I use to quit smoking? Talk with your health care provider about combining strategies, such as taking medicines while you are also receiving in-person counseling. Using these two strategies together makes you more likely to succeed in quitting than if you used either  strategy on its own.  If you are pregnant or breastfeeding, talk with your health care provider about finding counseling or other support strategies to quit smoking. Do not take medicine to help you quit smoking unless your health care provider tells you to do so. To quit smoking: Quit right away  Quit smoking completely, instead of gradually reducing how much you smoke over a period of time. Research shows that stopping smoking right away is more successful than gradually quitting.  Attend in-person counseling to help you build problem-solving skills. You are more likely to succeed in quitting if you attend counseling sessions regularly. Even short sessions of 10 minutes can be effective. Take medicine You may take medicines to help you quit smoking. Some medicines require a prescription and some you can purchase over-the-counter. Medicines may have nicotine in them to replace the nicotine in cigarettes. Medicines may:  Help to stop cravings.  Help to relieve withdrawal symptoms. Your health care provider may recommend:  Nicotine patches, gum, or lozenges.  Nicotine inhalers or sprays.  Non-nicotine medicine that is taken by mouth. Find resources Find resources and support systems that can help you to quit smoking and remain smoke-free after you quit. These resources are most helpful when you use them often. They include:  Online chats with a Social worker.  Telephone quitlines.  Printed Furniture conservator/restorer.  Support groups or group counseling.  Text messaging programs.  Mobile phone apps or applications. Use apps that can help you stick to your quit  plan by providing reminders, tips, and encouragement. There are many free apps for mobile devices as well as websites. Examples include Quit Guide from the [employer] and smokefree.gov What things can I do to make it easier to quit?   Reach out to your family and friends for support and encouragement. Call telephone quitlines  (1-800-QUIT-NOW), reach out to support groups, or work with a counselor for support.  Ask people who smoke to avoid smoking around you.  Avoid places that trigger you to smoke, such as bars, parties, or smoke-break areas at work.  Spend time with people who do not smoke.  Lessen the stress in your life. Stress can be a smoking trigger for some people. To lessen stress, try: ? Exercising regularly. ? Doing deep-breathing exercises. ? Doing yoga. ? Meditating. ? Performing a body scan. This involves closing your eyes, scanning your body from head to toe, and noticing which parts of your body are particularly tense. Try to relax the muscles in those areas. How will I feel when I quit smoking? Day 1 to 3 weeks Within the first 24 hours of quitting smoking, you may start to feel withdrawal symptoms. These symptoms are usually most noticeable 2-3 days after quitting, but they usually do not last for more than 2-3 weeks. You may experience these symptoms:  Mood swings.  Restlessness, anxiety, or irritability.  Trouble concentrating.  Dizziness.  Strong cravings for sugary foods and nicotine.  Mild weight gain.  Constipation.  Nausea.  Coughing or a sore throat.  Changes in how the medicines that you take for unrelated issues work in your body.  Depression.  Trouble sleeping (insomnia). Week 3 and afterward After the first 2-3 weeks of quitting, you may start to notice more positive results, such as:  Improved sense of smell and taste.  Decreased coughing and sore throat.  Slower heart rate.  Lower blood pressure.  Clearer skin.  The ability to breathe more easily.  Fewer sick days. Quitting smoking can be very challenging. Do not get discouraged if you are not successful the first time. Some people need to make many attempts to quit before they achieve long-term success. Do your best to stick to your quit plan, and talk with your health care provider if you have any  questions or concerns. Summary  Smoking tobacco is the leading cause of preventable death. Quitting smoking is one of the best things that you can do for your health.  When you decide to quit smoking, create a plan to help you succeed.  Quit smoking right away, not slowly over a period of time.  When you start quitting, seek help from your health care provider, family, or friends. This information is not intended to replace advice given to you by your health care provider. Make sure you discuss any questions you have with your health care provider. Document Revised: 01/03/2019 Document Reviewed: 06/29/2018 Elsevier Patient Education  [place].

## 2020-03-30 ENCOUNTER — Encounter: Payer: BC Managed Care – PPO | Attending: Internal Medicine | Admitting: Physical Therapy

## 2020-03-30 ENCOUNTER — Other Ambulatory Visit: Payer: Self-pay

## 2020-03-30 ENCOUNTER — Encounter: Payer: Self-pay | Admitting: Physical Therapy

## 2020-03-30 DIAGNOSIS — R278 Other lack of coordination: Secondary | ICD-10-CM | POA: Diagnosis present

## 2020-03-30 DIAGNOSIS — M6281 Muscle weakness (generalized): Secondary | ICD-10-CM | POA: Diagnosis not present

## 2020-03-30 DIAGNOSIS — N393 Stress incontinence (female) (male): Secondary | ICD-10-CM | POA: Diagnosis present

## 2020-03-30 NOTE — Therapy (Signed)
[place] at [facility] [address] [place], [place], [address] Phone: [phone]   Fax:  [phone]  Physical Therapy Evaluation  Patient Details  Name: NASIYA PASCUAL MRN: [id number] Date of Birth: 23-Mar-1966 Referring Provider (PT): Dr. Tia Masker   Encounter Date: 03/30/2020   PT End of Session - 03/30/20 1104    Visit Number 1    Date for PT Re-Evaluation 06/22/20    Authorization Type BCBS    PT Start Time 1030    PT Stop Time 1120    PT Time Calculation (min) 50 min    Activity Tolerance Patient tolerated treatment well    Behavior During Therapy [facility] for tasks assessed/performed           Past Medical History:  Diagnosis Date  . Bronchospasm 01/09/2013  . DUB (dysfunctional uterine bleeding)   . GERD (gastroesophageal reflux disease)    otc tums  . History of blood transfusion 2011   "related to fibrods"  . Hypertension   . Pneumonia ~ 2015; 04/29/2017  . Recurrent UTI, h/o urethra diverticuli 10/13/2009   Qualifier: Diagnosis of  By: Stanford Scotland MD, Jiles Garter    . Urethral diverticulum    Dr. Estill Dooms  . Yeast vaginitis    Dr. Estill Dooms    Past Surgical History:  Procedure Laterality Date  . BILATERAL SALPINGECTOMY  03/07/2012   Procedure: BILATERAL SALPINGECTOMY;  Surgeon: Lavonia Drafts, MD;  Location: [place] [place] [facility];  Service: Gynecology;  Laterality: Bilateral;  . CYSTOSCOPY  03/07/2012   Procedure: CYSTOSCOPY;  Surgeon: Lavonia Drafts, MD;  Location: [place] [facility];  Service: Gynecology;  Laterality: N/A;  . LASER ABLATION     "before they took my uterus out"  . ROBOTIC ASSISTED TOTAL HYSTERECTOMY  03/07/2012   Procedure: ROBOTIC ASSISTED TOTAL HYSTERECTOMY;  Surgeon: Lavonia Drafts, MD;  Location: [facility];  Service: Gynecology;  Laterality: N/A;  . TUBAL LIGATION    . URETHRAL DIVERTICULECTOMY    . VIDEO BRONCHOSCOPY Bilateral 05/01/2017   Procedure: VIDEO BRONCHOSCOPY WITH FLUORO;   Surgeon: Marshell Garfinkel, MD;  Location: [facility];  Service: Cardiopulmonary;  Laterality: Bilateral;    There were no vitals filed for this visit.    Subjective Assessment - 03/30/20 1036    Subjective Patient reports her urinary leakage has been worse since the last year. She is wearing adult briefs. Patient will wear 2 briefs per day and 1-2 per night.    Patient Stated Goals reduce leakage    Currently in Pain? No/denies              [facility] PT Assessment - 03/30/20 0001      Assessment   Medical Diagnosis N39.3 Stress incontinence of urine    Referring Provider (PT) Dr. Tia Masker    Onset Date/Surgical Date --   past year is worse   Prior Therapy none      Precautions   Precautions None      Restrictions   Weight Bearing Restrictions No      Balance Screen   Has the patient fallen in the past 6 months No    Has the patient had a decrease in activity level because of a fear of falling?  No    Is the patient reluctant to leave their home because of a fear of falling?  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   Overall Cognitive Status Within Functional Limits for tasks assessed      Observation/Other Assessments   Focus on Therapeutic Outcomes (FOTO)  UIQ-7 33%      Posture/Postural Control   Posture/Postural Control No significant limitations      ROM / Strength   AROM / PROM / Strength AROM;PROM;Strength      AROM   Overall AROM Comments Lumbar ROM is full      Strength   Right Hip Flexion 4/5    Right Hip Extension 4/5    Right Hip ABduction 5/5    Right Hip ADduction 5/5    Left Hip Flexion 4/5    Left Hip Extension 4/5    Left Hip ABduction 4/5    Left Hip ADduction 4/5       Palpation   SI assessment  ASIS is equal    Palpation comment tenderness located in the upper abdominal;  pressure suprapubic                      Objective measurements completed on examination: See above findings.     Pelvic Floor Special Questions - 03/30/20 0001    Prior Pregnancies Yes    Number of Pregnancies 3    Number of Vaginal Deliveries 3    Currently Sexually Active No    Urinary Leakage Yes    Pad use 2 briefs during the day and 1-2 at night, does not always she feels the leakage    Activities that cause leaking With strong urge;Coughing;Sneezing;Other    Other activities that cause leaking during the sleep    Fluid intake juice and water, soda including sunkist, Dr. Malachi Bonds, [place]. Dew; some days does not drink water    Caffeine beverages 1 cup of coffee per day    Skin Integrity Intact    Pelvic Floor Internal Exam Patient confirms identification and approves PT to assess pelvic floor and treatment    Exam Type Vaginal    Palpation tightness in the bulbocavernosus, levator ani and obturaotr internist    Strength --   1/5 on the right side and 0/5 on the left           [facility] Adult PT Treatment/Exercise - 03/30/20 0001      Self-Care   Self-Care Other Self-Care Comments    Other Self-Care Comments  educated patient on bladder irritants and how they affect the bladder and increase urinary leakage      Manual Therapy   Manual Therapy Internal Pelvic Floor    Manual therapy comments educated patient on how to perfrom her self vaginal massage    Internal Pelvic Floor perineal body, urethra sphincter, and bilateral sides of the introitus                  PT Education - 03/30/20 1123    Education Details education on bladder irritants; education on vaginal massage    Person(s) Educated Patient    Methods Explanation;Demonstration;Handout    Comprehension Verbalized understanding            PT Short Term Goals - 03/30/20 1253      PT SHORT  TERM GOAL #1   Title independent with vaginal massage to improve tissue mobitliy    Time  4    Period Weeks    Status New    Target Date 04/27/20      PT SHORT TERM GOAL #2   Title understand what bladder irritants are and how they affect the bladder    Time 4    Period Weeks    Status New    Target Date 04/27/20      PT SHORT TERM GOAL #3   Title urinary leakage decreased >/= 25% due to improved tissue mobitlity    Time 4    Period Weeks    Status New    Target Date 04/27/20             PT Long Term Goals - 03/30/20 1254      PT LONG TERM GOAL #1   Title independent with advanced HEP    Time 12    Period Weeks    Status New    Target Date 06/22/20      PT LONG TERM GOAL #2   Title able to contract the pelvic floor and lift to reduce urinary leakage >/= 75%    Time 12    Period Weeks    Status New    Target Date 06/22/20      PT LONG TERM GOAL #3   Title able to walk slowly to the bathroom without leaking urine due to improved pelvic floor coordination    Time 12    Period Weeks    Status New    Target Date 06/22/20      PT LONG TERM GOAL #4   Title reduce her pads to 1 light day during the day and none at night due to reduction of urinary leakage    Time 12    Period Weeks    Status New    Target Date 06/22/20                  Plan - 03/30/20 1130    Clinical Impression Statement Patient is a 54 year old female with stress incontinence. Patient reports her urinary leakage is worse in the past year and she is wearing 3-4 depends per day. Patient will leak urine with coughing, sneezing, strong urge and during the night. At night she does not feel the leakage. Patient drinks 1 glass of water several days per week. She drinks more sodas, juices and coffee. Pelvic floor strength on the right is 1/5 and left is 0/5. She is tight on the bulbocavernosus, perineal body, and urethra spincter. Patient does report she will have fecal streaks on her underwear.  Bilateral hip abduction, adduction and extension weakness. She has tenderness located in the upper abdomen and difficulty with opening the lower rib cage. Patient will benefit from skilled therapy to improve pelvic floor coordination and strength to reduce her leakage.    Personal Factors and Comorbidities Comorbidity 2;Age;Fitness    Comorbidities Robotic Assisted Hysterectomy 03/07/2012; Urethral Diverticulectomy    Examination-Activity Limitations Continence;Hygiene/Grooming;Toileting    Examination-Participation Restrictions Community Activity    Stability/Clinical Decision Making Stable/Uncomplicated    Clinical Decision Making Low    PT Frequency 1x / week    PT Duration 12 weeks    PT Treatment/Interventions ADLs/Self Care Home Management;Biofeedback;Electrical Stimulation;Moist Heat;Ultrasound;Therapeutic activities;Therapeutic exercise;Neuromuscular re-education;Manual techniques;Patient/family education;Dry needling    PT Next Visit Plan work on diaphragmatic breathing, manual work to the pelvic floor, hip stretches, abdominal bracing, see how the removal of bladder irritants helped    Consulted and Agree with Plan of  Care Patient           Patient will benefit from skilled therapeutic intervention in order to improve the following deficits and impairments:  Decreased coordination, Increased fascial restricitons, Decreased endurance, Decreased activity tolerance, Decreased strength  Visit Diagnosis: Muscle weakness (generalized) - Plan: PT plan of care cert/re-cert  Other lack of coordination - Plan: PT plan of care cert/re-cert  Stress incontinence (female) (female) - Plan: PT plan of care cert/re-cert     Problem List Patient Active Problem List   Diagnosis Date Noted  . Hyperlipidemia 07/03/2019  . CKD (chronic kidney disease) 07/03/2019  . Tachypnea 04/29/2017  . 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 FOM (frequency of micturition) 07/06/2014  . Phlebitis of arm  10/20/2013  . Anxiety and depression 03/05/2013  . Bronchospasm 01/09/2013  . Annual physical exam 09/19/2012  . Edema of   legs, L>R 09/19/2012  . GERD (gastroesophageal reflux disease) 09/19/2012  . LEIOMYOMA OF UTERUS, UNSPECIFIED 10/13/2009  . ANEMIA, HYPOCHROMIC 10/13/2009  . Essential hypertension, benign 10/13/2009  . Recurrent UTI, h/o urethra diverticuli 10/13/2009  . DYSFUNCTIONAL UTERINE BLEEDING 10/13/2009    Earlie Counts, PT 03/30/20 12:59 PM   [facility] [address] [place], [place], [address] Phone: [phone]   Fax:  [phone]  Name: EVANN KOELZER MRN: [id number] Date of Birth: [birth year]

## 2020-03-30 NOTE — Patient Instructions (Addendum)
STRETCHING THE PELVIC FLOOR MUSCLES NO DILATOR  Supplies . Vaginal lubricant . Mirror (optional) . Gloves (optional) or clean hands Positioning . Start in a semi-reclined position with your head propped up. Bend your knees and place your thumb or finger at the vaginal opening. Procedure . Apply a moderate amount of lubricant on the outer skin of your vagina, the labia minora.  Apply additional lubricant to your finger. Marland Kitchen Spread the skin away from the vaginal opening. Place the end of your finger at the opening. . Do a maximum contraction of the pelvic floor muscles. Tighten the vagina and the anus maximally and relax. . When you know they are relaxed, gently and slowly insert your finger into your vagina, directing your finger slightly downward, for 2-3 inches of insertion. . Relax and stretch the 6 o'clock position . Hold each stretch for _30-60 seconds, no pain more than 3/10 . Repeat the stretching in the 4 o'clock and 8 o'clock positions. . Next gently move your finger in a "U" shape  several times.  . You can also enter a second finger to work to spread the vaginal opening wider from 3:00-6:00 and 6:00-9:00 or 3:00-9:00 . Perform daily or every other day . Once you have accomplished the techniques you may try them in standing with one foot resting on the tub, or in other positions.  This is a good stretch to do in the shower if you don't need to use lubricant.   Certain foods and liquids will decrease the pH making the urine more acidic.  Urinary urgency increases when the urine has a low pH.  Most common irritants: alcohol, carbonated beverages and caffinated beverages.  Foods to avoid: apple juice, apples, ascorbic acid, canteloupes, chili, citrus fruits, coffee, cranberries, grapes, guava, peaches, pepper, pineapple, plums, strawberries, tea, tomatoes, and vinegar.  Drinking plenty of water may help to increase the pH and dilute out any of the effects of specific irritants.  Foods  that are NOT irritating to the bladder include: Pears, papayas, sun-brewed teas, watermelons, non-citrus herbal teas, apricots, kava and low-acid instant drinks (Postum)  Earlie Counts, PT [facility] [address] [place], [place] [address] W: [phone] [email or web address]

## 2020-04-13 ENCOUNTER — Encounter: Payer: Self-pay | Admitting: Physical Therapy

## 2020-04-13 ENCOUNTER — Encounter: Payer: BC Managed Care – PPO | Admitting: Physical Therapy

## 2020-04-13 ENCOUNTER — Other Ambulatory Visit: Payer: Self-pay

## 2020-04-13 DIAGNOSIS — M6281 Muscle weakness (generalized): Secondary | ICD-10-CM

## 2020-04-13 DIAGNOSIS — R278 Other lack of coordination: Secondary | ICD-10-CM

## 2020-04-13 DIAGNOSIS — N393 Stress incontinence (female) (male): Secondary | ICD-10-CM

## 2020-04-13 NOTE — Patient Instructions (Signed)
Access Code: [email or web address] URL: [email or web address] Date: 04/13/2020 Prepared by: Earlie Counts  Program Notes stop drinking fluids 2 hours prior to going to sleep and elevate legs 30 minutes prior to sleep    Exercises Supine Hamstring Stretch with Strap - 1 x daily - 7 x weekly - 1 sets - 2 reps - 30 sec hold Supine ITB Stretch with Strap - 1 x daily - 7 x weekly - 1 sets - 2 reps - 30 sec hold Supine Figure 4 Piriformis Stretch - 1 x daily - 7 x weekly - 1 sets - 2 reps - 30 sec hold Supine Butterfly Groin Stretch - 1 x daily - 7 x weekly - 1 sets - 1 reps - 1 min hold Supine Transversus Abdominis Bracing - Hands on Stomach - 1 x daily - 7 x weekly - 1 sets - 10 reps - 5 sec hold Sidelying Pelvic Floor Contraction with Self-Palpation - 3 x daily - 7 x weekly - 1 sets - 10 reps - 5 sec hold [facility] [address] [place], [place] [address] Phone # [phone] Fax [phone]

## 2020-04-13 NOTE — Therapy (Signed)
[facility] at [facility] [address] [place], [place], [address] Phone: [phone]   Fax:  [phone]  Physical Therapy Treatment  Patient Details  Name: Kristin Burnett MRN: [id number] Date of Birth: 1966-01-16 Referring Provider (PT): Dr. Tia Masker   Encounter Date: 04/13/2020   PT End of Session - 04/13/20 1110    Visit Number 2    Date for PT Re-Evaluation 06/22/20    Authorization Type BCBS    PT Start Time 1038    PT Stop Time 1130    PT Time Calculation (min) 52 min    Activity Tolerance Patient tolerated treatment well    Behavior During Therapy [facility] for tasks assessed/performed           Past Medical History:  Diagnosis Date   Bronchospasm 01/09/2013   DUB (dysfunctional uterine bleeding)    GERD (gastroesophageal reflux disease)    otc tums   History of blood transfusion 2011   "related to fibrods"   Hypertension    Pneumonia ~ 2015; 04/29/2017   Recurrent UTI, h/o urethra diverticuli 10/13/2009   Qualifier: Diagnosis of  By: Stanford Scotland MD, [name]     Urethral diverticulum    Dr. Estill Dooms   Yeast vaginitis    Dr. Estill Dooms    Past Surgical History:  Procedure Laterality Date   BILATERAL SALPINGECTOMY  03/07/2012   Procedure: BILATERAL SALPINGECTOMY;  Surgeon: Lavonia Drafts, MD;  Location: [facility];  Service: Gynecology;  Laterality: Bilateral;   CYSTOSCOPY  03/07/2012   Procedure: CYSTOSCOPY;  Surgeon: Lavonia Drafts, MD;  Location: [facility];  Service: Gynecology;  Laterality: N/A;   LASER ABLATION     "before they took my uterus out"   ROBOTIC ASSISTED TOTAL HYSTERECTOMY  03/07/2012   Procedure: ROBOTIC ASSISTED TOTAL HYSTERECTOMY;  Surgeon: Lavonia Drafts, MD;  Location: [facility] [facility];  Service: Gynecology;  Laterality: N/A;   TUBAL LIGATION     URETHRAL DIVERTICULECTOMY     VIDEO BRONCHOSCOPY Bilateral 05/01/2017   Procedure: VIDEO BRONCHOSCOPY WITH FLUORO;   Surgeon: Marshell Garfinkel, MD;  Location: [place];  Service: Cardiopulmonary;  Laterality: Bilateral;    There were no vitals filed for this visit.   Subjective Assessment - 04/13/20 1041    Subjective I am trying to do the exercise at home and it is uncomfortable. I feel like I can contract the pelvic floor better. Tells the right side works better than the left. I have been drinking more water. Removing the bladder irritants has reduce the urgency. I leak mostly at night. Control the daytime leakage better.    Patient Stated Goals reduce leakage    Currently in Pain? No/denies                          Pelvic Floor Special Questions - 04/13/20 0001    Pelvic Floor Internal Exam Patient confirms identification and approves PT to assess pelvic floor and treatment    Exam Type Vaginal             [facility] Adult PT Treatment/Exercise - 04/13/20 0001      Self-Care   Self-Care Other Self-Care Comments    Other Self-Care Comments  discussed with patient to stop drinking water 2 hours prior to bed time, and lay prior to sleeping with legs elevated to reduce urinary leakage.      Neuro Re-ed    Neuro Re-ed Details  tactile cues and tapping to the  pelvic muscles to improve circular contraction in right sidley      Lumbar Exercises: Stretches   Active Hamstring Stretch Right;Left;1 rep;30 seconds    Active Hamstring Stretch Limitations supine with strap    ITB Stretch Right;Left;1 rep;30 seconds    ITB Stretch Limitations supine with strap    Piriformis Stretch Right;Left;1 rep;30 seconds    Piriformis Stretch Limitations supine    Other Lumbar Stretch Exercise butterfly stretch      Lumbar Exercises: Supine   Ab Set 10 reps;5 seconds    AB Set Limitations with ball squeeze and tactile cues to press back into mat      Lumbar Exercises: Quadruped   Madcat/Old Horse 15 reps    Madcat/Old Horse Limitations focus on lumbar extension and abdominal contraction with  rounded of the spine      Manual Therapy   Manual Therapy Internal Pelvic Floor    Internal Pelvic Floor urethra spincter, bulbocavernosus, levator ani, and perineal body                  PT Education - 04/13/20 1130    Education Details Access Code: [email or web address]; elevate legs prior to sleeping; reduce liquid intake 2 hours prior to sleep    Person(s) Educated Patient    Methods Explanation;Demonstration;Verbal cues;Handout    Comprehension Returned demonstration;Verbalized understanding            PT Short Term Goals - 04/13/20 1146      PT SHORT TERM GOAL #1   Title independent with vaginal massage to improve tissue mobitliy    Time 4    Period Weeks    Status On-going      PT SHORT TERM GOAL #2   Title understand what bladder irritants are and how they affect the bladder    Time 4    Period Weeks    Status Achieved      PT SHORT TERM GOAL #3   Title urinary leakage decreased >/= 25% due to improved tissue mobitlity    Time 4    Period Weeks    Status On-going             PT Long Term Goals - 03/30/20 1254      PT LONG TERM GOAL #1   Title independent with advanced HEP    Time 12    Period Weeks    Status New    Target Date 06/22/20      PT LONG TERM GOAL #2   Title able to contract the pelvic floor and lift to reduce urinary leakage >/= 75%    Time 12    Period Weeks    Status New    Target Date 06/22/20      PT LONG TERM GOAL #3   Title able to walk slowly to the bathroom without leaking urine due to improved pelvic floor coordination    Time 12    Period Weeks    Status New    Target Date 06/22/20      PT LONG TERM GOAL #4   Title reduce her pads to 1 light day during the day and none at night due to reduction of urinary leakage    Time 12    Period Weeks    Status New    Target Date 06/22/20                 Plan - 04/13/20 1110    Clinical Impression Statement Patient pelvic floor  strength increased to 1/5 bilateral sides  after manual work. Patient is able to feel the pelvic floor  contract and the abdominals contract. Patient reports less urinaty leakage during the day. She is having fecal leakage the past few weeks and may be due to the pelvic floor weakness. Patient was educated on ways to reduce the leakage during the night. Patient has reduced the bladder irritants and notices a big rduction in urinary urgency. Patient will benefit from skilled therapy to improve pelvic floor coordination and strength to reduce her leakage.    Personal Factors and Comorbidities Comorbidity 2;Age;Fitness    Comorbidities Robotic Assisted Hysterectomy 03/07/2012; Urethral Diverticulectomy    Examination-Activity Limitations Continence;Hygiene/Grooming;Toileting    Examination-Participation Restrictions Community Activity    Stability/Clinical Decision Making Stable/Uncomplicated    Rehab Potential Excellent    PT Frequency 1x / week    PT Duration 12 weeks    PT Treatment/Interventions ADLs/Self Care Home Management;Biofeedback;Electrical Stimulation;Moist Heat;Ultrasound;Therapeutic activities;Therapeutic exercise;Neuromuscular re-education;Manual techniques;Patient/family education;Dry needling    PT Next Visit Plan work on diaphragmatic breathing, possible pelvic EMG, lengthening of the pelvic floor muscles, add core exercises; review self perineal massage    PT Home Exercise Plan Access Code: [email or web address]    Recommended Other Services MD signed initial eval    Consulted and Agree with Plan of Care Patient           Patient will benefit from skilled therapeutic intervention in order to improve the following deficits and impairments:  Decreased coordination,Increased fascial restricitons,Decreased endurance,Decreased activity tolerance,Decreased strength  Visit Diagnosis: Muscle weakness (generalized)  Other lack of coordination  Stress incontinence (female) (female)     Problem List Patient Active Problem List    Diagnosis Date Noted   Hyperlipidemia 07/03/2019   CKD (chronic kidney disease) 07/03/2019   Tachypnea 04/29/2017   Urethra, diverticulum 07/06/2014   FOM (frequency of micturition) 07/06/2014   Phlebitis of arm 10/20/2013   Anxiety and depression 03/05/2013   Bronchospasm 01/09/2013   Annual physical exam 09/19/2012   Edema of   legs, L>R 09/19/2012   GERD (gastroesophageal reflux disease) 09/19/2012   LEIOMYOMA OF UTERUS, UNSPECIFIED 10/13/2009   ANEMIA, HYPOCHROMIC 10/13/2009   Essential hypertension, benign 10/13/2009   Recurrent UTI, h/o urethra diverticuli 10/13/2009   DYSFUNCTIONAL UTERINE BLEEDING 10/13/2009    Earlie Counts, PT 04/13/20 11:49 AM   [place] at [facility] [address], [place], [place], [address] Phone: [phone]   Fax:  [phone]  Name: Kristin Burnett MRN: [id number] Date of Birth: 08-Mar-1966

## 2020-04-20 ENCOUNTER — Encounter: Payer: Self-pay | Admitting: Physical Therapy

## 2020-04-20 ENCOUNTER — Encounter: Payer: BC Managed Care – PPO | Admitting: Physical Therapy

## 2020-04-20 ENCOUNTER — Other Ambulatory Visit: Payer: Self-pay

## 2020-04-20 DIAGNOSIS — M6281 Muscle weakness (generalized): Secondary | ICD-10-CM | POA: Diagnosis not present

## 2020-04-20 DIAGNOSIS — N393 Stress incontinence (female) (male): Secondary | ICD-10-CM

## 2020-04-20 DIAGNOSIS — R278 Other lack of coordination: Secondary | ICD-10-CM

## 2020-04-20 NOTE — Patient Instructions (Signed)
Access Code: [email or web address] URL: [email or web address] Date: 04/20/2020 Prepared by: 04/22/2020  Program Notes stop drinking fluids 2 hours prior to going to sleep and elevate legs 30 minutes prior to sleep    Exercises Supine Hamstring Stretch with Strap - 1 x daily - 7 x weekly - 1 sets - 2 reps - 30 sec hold Supine ITB Stretch with Strap - 1 x daily - 7 x weekly - 1 sets - 2 reps - 30 sec hold Supine Figure 4 Piriformis Stretch - 1 x daily - 7 x weekly - 1 sets - 2 reps - 30 sec hold Supine Butterfly Groin Stretch - 1 x daily - 7 x weekly - 1 sets - 1 reps - 1 min hold Supine Transversus Abdominis Bracing - Hands on Stomach - 1 x daily - 7 x weekly - 1 sets - 10 reps - 5 sec hold Standing Anti-Rotation Press with Anchored Resistance - 1 x daily - 7 x weekly - 2 sets - 10 reps Supine Bridge with Mini Swiss Ball Between Knees - 1 x daily - 7 x weekly - 1 sets - 10 reps Quadruped Pelvic Floor Contraction with Hip Adduction and Towel Roll - 1 x daily - 7 x weekly - 1 sets - 10 reps - 5 sec hold Prone Hip Extension Sequence - 1 x daily - 7 x weekly - 1 sets - 10 reps - 5 sec hold Seated Transversus Abdominis Bracing - 1 x daily - 7 x weekly - 1 sets - 10 reps - hold 5sec hold Eulis Foster, PT [facility] [address], [address] [place], [place] W: [phone] [email or web address] [email or web address]

## 2020-04-20 NOTE — Therapy (Signed)
[facility] at [facility] [address], [address] [place], [place], [address] Phone: [phone]   Fax:  [phone]  Physical Therapy Treatment  Patient Details  Name: Kristin Burnett MRN: [id number] Date of Birth: [birth year] Referring Provider (PT): Dr. Adela Glimpse   Encounter Date: 04/20/2020   PT End of Session - 04/20/20 1118    Visit Number 3    Date for PT Re-Evaluation 06/22/20    Authorization Type BCBS    PT Start Time 1030    PT Stop Time 1115    PT Time Calculation (min) 45 min    Activity Tolerance Patient tolerated treatment well    Behavior During Therapy [facility] for tasks assessed/performed           Past Medical History:  Diagnosis Date   Bronchospasm 01/09/2013   DUB (dysfunctional uterine bleeding)    GERD (gastroesophageal reflux disease)    otc tums   History of blood transfusion 2011   "related to fibrods"   Hypertension    Pneumonia ~ 2015; 04/29/2017   Recurrent UTI, h/o urethra diverticuli 10/13/2009   Qualifier: Diagnosis of  By: [name] MD, [name]     Urethral diverticulum    Dr. Lindley Magnus   Yeast vaginitis    Dr. Lindley Magnus    Past Surgical History:  Procedure Laterality Date   BILATERAL SALPINGECTOMY  03/07/2012   Procedure: BILATERAL SALPINGECTOMY;  Surgeon: [name], MD;  Location: [facility];  Service: Gynecology;  Laterality: Bilateral;   CYSTOSCOPY  03/07/2012   Procedure: CYSTOSCOPY;  Surgeon: [name], MD;  Location: [facility];  Service: Gynecology;  Laterality: N/A;   LASER ABLATION     "before they took my uterus out"   ROBOTIC ASSISTED TOTAL HYSTERECTOMY  03/07/2012   Procedure: ROBOTIC ASSISTED TOTAL HYSTERECTOMY;  Surgeon: [name], MD;  Location: [facility];  Service: Gynecology;  Laterality: N/A;   TUBAL LIGATION     URETHRAL DIVERTICULECTOMY     VIDEO BRONCHOSCOPY Bilateral 05/01/2017   Procedure: VIDEO BRONCHOSCOPY WITH FLUORO;   Surgeon: [name], MD;  Location: [facility];  Service: Cardiopulmonary;  Laterality: Bilateral;    There were no vitals filed for this visit.   Subjective Assessment - 04/20/20 1036    Subjective Not much proble during the day. I stop drinking 2 hours prior to going to bed. Drinking more water.    Patient Stated Goals reduce leakage    Currently in Pain? No/denies                             [facility] Adult PT Treatment/Exercise - 04/20/20 0001      Self-Care   Self-Care Other Self-Care Comments    Other Self-Care Comments  discused with patient her beverages and having them througout the day instead after work, elevating theleg prior to bed, driking water during the day;      Lumbar Exercises: Stretches   Other Lumbar Stretch Exercise sitting press on foam roll to activate the pelvic floor and use ball squeeze 10x hold 5 sec      Lumbar Exercises: Supine   Ab Set 10 reps;5 seconds    AB Set Limitations with ball squeeze and tactile cues to press back into mat   tactile cues are needed   Bridge with [employer] Limitations 10x with lots of effort    Other Supine Lumbar Exercises pallof press with ball squeez and pelvis floor  contraction  Lumbar Exercises: Quadruped   Other Quadruped Lumbar Exercises towel squeeeze with pelvic floor contraction holding 5 sec 10x      Knee/Hip Exercises: Prone   Hip Extension Strengthening;Right;Left;1 set;10 reps    Hip Extension Limitations with toes on mat and lift knee withpelvic floor contraction holding 5 sec                  PT Education - 04/20/20 1112    Education Details Access Code: [email or web address] on ways to reduce bladder irritants later in the day and elevating her legs prior to sleep to reduce urinary leakage at night    Person(s) Educated Patient    Methods Explanation;Demonstration;Verbal cues;Handout    Comprehension Returned demonstration;Verbalized understanding            PT  Short Term Goals - 04/20/20 1123      PT SHORT TERM GOAL #1   Title independent with vaginal massage to improve tissue mobitliy    Period Weeks    Status Achieved      PT SHORT TERM GOAL #2   Title understand what bladder irritants are and how they affect the bladder    Time 4    Period Weeks    Status Achieved      PT SHORT TERM GOAL #3   Title urinary leakage decreased >/= 25% due to improved tissue mobitlity    Baseline achieved for during the day but not at night    Time 4    Period Weeks    Status On-going             PT Long Term Goals - 03/30/20 1254      PT LONG TERM GOAL #1   Title independent with advanced HEP    Time 12    Period Weeks    Status New    Target Date 06/22/20      PT LONG TERM GOAL #2   Title able to contract the pelvic floor and lift to reduce urinary leakage >/= 75%    Time 12    Period Weeks    Status New    Target Date 06/22/20      PT LONG TERM GOAL #3   Title able to walk slowly to the bathroom without leaking urine due to improved pelvic floor coordination    Time 12    Period Weeks    Status New    Target Date 06/22/20      PT LONG TERM GOAL #4   Title reduce her pads to 1 light day during the day and none at night due to reduction of urinary leakage    Time 12    Period Weeks    Status New    Target Date 06/22/20                 Plan - 04/20/20 1118    Clinical Impression Statement Patient reports she is not having as much leakage during the day. Her leakage is during the night. Patient reports she is able to feel the pelvic floor contract. Patient is still working on her night time routine with reducing bladder irritants, elevating legs, and drinking water during the day. Patient has difficulty with lifting her hips up with a bridge. She needs verbal cues to breath with her movements. Patient will benefit from skilled therapy to improve pelvic floor coordination and strength to reduce her leakage.    Personal Factors  and Comorbidities Comorbidity 2;Age;Fitness  Comorbidities Robotic Assisted Hysterectomy 03/07/2012; Urethral Diverticulectomy    Examination-Activity Limitations Continence;Hygiene/Grooming;Toileting    Examination-Participation Restrictions Community Activity    Stability/Clinical Decision Making Stable/Uncomplicated    Rehab Potential Excellent    PT Frequency 1x / week    PT Duration 12 weeks    PT Treatment/Interventions ADLs/Self Care Home Management;Biofeedback;Electrical Stimulation;Moist Heat;Ultrasound;Therapeutic activities;Therapeutic exercise;Neuromuscular re-education;Manual techniques;Patient/family education;Dry needling    PT Next Visit Plan possible pelvic floor EMG; see how the night time leakage is, see if bridges are easer, quadruped hinging, sitting cross abdominal contraction    PT Home Exercise Plan Access Code: [email or web address]    Consulted and Agree with Plan of Care Patient           Patient will benefit from skilled therapeutic intervention in order to improve the following deficits and impairments:  Decreased coordination,Increased fascial restricitons,Decreased endurance,Decreased activity tolerance,Decreased strength  Visit Diagnosis: Muscle weakness (generalized)  Other lack of coordination  Stress incontinence (female) (female)     Problem List Patient Active Problem List   Diagnosis Date Noted   Hyperlipidemia 07/03/2019   CKD (chronic kidney disease) 07/03/2019   Tachypnea 04/29/2017   Urethra, diverticulum 07/06/2014   FOM (frequency of micturition) 07/06/2014   Phlebitis of arm 10/20/2013   Anxiety and depression 03/05/2013   Bronchospasm 01/09/2013   Annual physical exam 09/19/2012   Edema of   legs, L>R 09/19/2012   GERD (gastroesophageal reflux disease) 09/19/2012   LEIOMYOMA OF UTERUS, UNSPECIFIED 10/13/2009   ANEMIA, HYPOCHROMIC 10/13/2009   Essential hypertension, benign 10/13/2009   Recurrent UTI, h/o urethra diverticuli  10/13/2009   DYSFUNCTIONAL UTERINE BLEEDING 10/13/2009    10/15/2009, PT 04/20/20 11:25 AM   [place] [facility] at [facility] [address], [address] [place], [place], [place] Phone: [phone]   Fax:  [phone]  Name: Kristin Burnett MRN: [name] Date of Birth: Mar 05, 1966

## 2020-04-27 ENCOUNTER — Encounter: Payer: BC Managed Care – PPO | Admitting: Physical Therapy

## 2020-05-04 ENCOUNTER — Other Ambulatory Visit: Payer: Self-pay

## 2020-05-04 ENCOUNTER — Encounter: Payer: Self-pay | Admitting: Physical Therapy

## 2020-05-04 ENCOUNTER — Encounter: Payer: BC Managed Care – PPO | Attending: Internal Medicine | Admitting: Physical Therapy

## 2020-05-04 DIAGNOSIS — M6281 Muscle weakness (generalized): Secondary | ICD-10-CM | POA: Insufficient documentation

## 2020-05-04 DIAGNOSIS — N393 Stress incontinence (female) (male): Secondary | ICD-10-CM | POA: Diagnosis not present

## 2020-05-04 DIAGNOSIS — R278 Other lack of coordination: Secondary | ICD-10-CM | POA: Diagnosis not present

## 2020-05-04 NOTE — Patient Instructions (Signed)
Access Code: [email or web address] URL: [email or web address] Date: 05/04/2020 Prepared by: Earlie Counts  Program Notes stop drinking fluids 2 hours prior to going to sleep and elevate legs 30 minutes prior to sleep    Exercises Supine Hamstring Stretch with Strap - 1 x daily - 7 x weekly - 1 sets - 2 reps - 30 sec hold Supine ITB Stretch with Strap - 1 x daily - 7 x weekly - 1 sets - 2 reps - 30 sec hold Supine Figure 4 Piriformis Stretch - 1 x daily - 7 x weekly - 1 sets - 2 reps - 30 sec hold Supine Butterfly Groin Stretch - 1 x daily - 7 x weekly - 1 sets - 1 reps - 1 min hold Supine Transversus Abdominis Bracing - Hands on Stomach - 1 x daily - 7 x weekly - 1 sets - 10 reps - 5 sec hold Standing Anti-Rotation Press with Anchored Resistance - 1 x daily - 7 x weekly - 2 sets - 10 reps Supine Bridge with Mini Swiss Ball Between Knees - 1 x daily - 7 x weekly - 1 sets - 10 reps Quadruped Pelvic Floor Contraction with Hip Adduction and Towel Roll - 1 x daily - 7 x weekly - 1 sets - 10 reps - 5 sec hold Prone Hip Extension Sequence - 1 x daily - 7 x weekly - 1 sets - 10 reps - 5 sec hold Seated Transversus Abdominis Bracing - 1 x daily - 7 x weekly - 1 sets - 10 reps - hold 5sec hold Supine March - 1 x daily - 7 x weekly - 2 sets - 10 reps Earlie Counts, PT [facility] [address], [place], [place] [address] W: [phone] [email or web address]

## 2020-05-04 NOTE — Therapy (Signed)
[place] at [facility] [address], [place], [place], [address] Phone: [phone]   Fax:  [phone]  Physical Therapy Treatment  Patient Details  Name: Kristin Burnett MRN: [id number] Date of Birth: 04/16/1966 Referring Provider (PT): Dr. Tia Masker   Encounter Date: 05/04/2020   PT End of Session - 05/04/20 1018    Visit Number 4    Date for PT Re-Evaluation 06/22/20    Authorization Type BCBS    PT Start Time 0930    PT Stop Time 1020    PT Time Calculation (min) 50 min    Activity Tolerance Patient tolerated treatment well    Behavior During Therapy [facility] for tasks assessed/performed           Past Medical History:  Diagnosis Date  . Bronchospasm 01/09/2013  . DUB (dysfunctional uterine bleeding)   . GERD (gastroesophageal reflux disease)    otc tums  . History of blood transfusion 2011   "related to fibrods"  . Hypertension   . Pneumonia ~ 2015; 04/29/2017  . Recurrent UTI, h/o urethra diverticuli 10/13/2009   Qualifier: Diagnosis of  By: Stanford Scotland MD, Jiles Garter    . Urethral diverticulum    Dr. Estill Dooms  . Yeast vaginitis    Dr. Estill Dooms    Past Surgical History:  Procedure Laterality Date  . BILATERAL SALPINGECTOMY  03/07/2012   Procedure: BILATERAL SALPINGECTOMY;  Surgeon: Lavonia Drafts, MD;  Location: [facility];  Service: Gynecology;  Laterality: Bilateral;  . CYSTOSCOPY  03/07/2012   Procedure: CYSTOSCOPY;  Surgeon: Lavonia Drafts, MD;  Location: [facility];  Service: Gynecology;  Laterality: N/A;  . LASER ABLATION     "before they took my uterus out"  . ROBOTIC ASSISTED TOTAL HYSTERECTOMY  03/07/2012   Procedure: ROBOTIC ASSISTED TOTAL HYSTERECTOMY;  Surgeon: [place]onia Drafts, MD;  Location: [facility];  Service: Gynecology;  Laterality: N/A;  . TUBAL LIGATION    . URETHRAL DIVERTICULECTOMY    . VIDEO BRONCHOSCOPY Bilateral 05/01/2017   Procedure: VIDEO BRONCHOSCOPY WITH FLUORO;   Surgeon: Marshell Garfinkel, MD;  Location: [facility];  Service: Cardiopulmonary;  Laterality: Bilateral;    There were no vitals filed for this visit.   Subjective Assessment - 05/04/20 0937    Subjective While I am laying down the urine is leaking out. Leak with coughing or while walking to the bathroom when has the urge.    Patient Stated Goals reduce leakage    Currently in Pain? No/denies                          Pelvic Floor Special Questions - 05/04/20 0001    Biofeedback sitting resting tone contract 1.2 uv with resting tone at 1 uv for resting, when laying on side unable to contract, prone contracting the pelvic floor and sidely contracting the pelvic floor.    Biofeedback sensor type Surface   vaginal            [facility] Adult PT Treatment/Exercise - 05/04/20 0001      Lumbar Exercises: Supine   Ab Set 10 reps;3 seconds    AB Set Limitations with tactile cues from therapist, VC to not bear down and hooked to the pelvic floor EMG    Bent Knee Raise 20 reps;1 second    Bent Knee Raise Limitations with knee bent up as pull the red band downward with pelvic floor contraction   while using the pelvic floor EMG  Bridge 10 reps;1 second    Bridge Limitations with pelvic floor contarction   while using the pelvic floor EMG     Lumbar Exercises: Quadruped   Other Quadruped Lumbar Exercises towel squeeeze with pelvic floor contraction holding 5 sec 10x while using the pelvic EMG      Knee/Hip Exercises: Prone   Hip Extension Strengthening;Right;Left;1 set;10 reps    Hip Extension Limitations with pelvic floor contraction, while using the pelvic lfoor EMG                  PT Education - 05/04/20 1018    Education Details Access Code: [email or web address]    Person(s) Educated Patient    Methods Explanation;Demonstration;Handout    Comprehension Returned demonstration;Verbalized understanding            PT Short Term Goals - 05/04/20 1001      PT SHORT TERM  GOAL #3   Title urinary leakage decreased >/= 25% due to improved tissue mobitlity    Baseline achieved for during the day but not at night    Time 4    Period Weeks    Status On-going    Target Date 04/27/20             PT Long Term Goals - 03/30/20 1254      PT LONG TERM GOAL #1   Title independent with advanced HEP    Time 12    Period Weeks    Status New    Target Date 06/22/20      PT LONG TERM GOAL #2   Title able to contract the pelvic floor and lift to reduce urinary leakage >/= 75%    Time 12    Period Weeks    Status New    Target Date 06/22/20      PT LONG TERM GOAL #3   Title able to walk slowly to the bathroom without leaking urine due to improved pelvic floor coordination    Time 12    Period Weeks    Status New    Target Date 06/22/20      PT LONG TERM GOAL #4   Title reduce her pads to 1 light day during the day and none at night due to reduction of urinary leakage    Time 12    Period Weeks    Status New    Target Date 06/22/20                 Plan - 05/04/20 1024    Clinical Impression Statement Patient only leaks with couging, when she has the urge to urinate during the day which is an improvement. Patient urinary leakage has not changed at night time. When she wakes up, she will feel the urine dripping out. Patient contracts her pelvic floor better when she is exercisisng. She is able to contract in sitting without movement. Patient has trouble with bearing down as she is contracting the pelvic floor and lower abdominals. Patient continues to have decreased hip extension strength. Patient will benefit from skilled therapy to improve pelvic floor coordination and strength to reduce her urinary leakage especially at night.    Personal Factors and Comorbidities Comorbidity 2;Age;Fitness    Comorbidities Robotic Assisted Hysterectomy 03/07/2012; Urethral Diverticulectomy    Examination-Activity Limitations Continence;Hygiene/Grooming;Toileting     Examination-Participation Restrictions Community Activity    Stability/Clinical Decision Making Stable/Uncomplicated    Rehab Potential Excellent    PT Frequency 1x / week    PT  Duration 12 weeks    PT Treatment/Interventions ADLs/Self Care Home Management;Biofeedback;Electrical Stimulation;Moist Heat;Ultrasound;Therapeutic activities;Therapeutic exercise;Neuromuscular re-education;Manual techniques;Patient/family education;Dry needling    PT Next Visit Plan possible pelvic floor EMG; see how the night time leakage , quadruped hinging, sitting cross abdominal contraction    PT Home Exercise Plan Access Code: [email or web address]    Consulted and Agree with Plan of Care Patient           Patient will benefit from skilled therapeutic intervention in order to improve the following deficits and impairments:  Decreased coordination,Increased fascial restricitons,Decreased endurance,Decreased activity tolerance,Decreased strength  Visit Diagnosis: Muscle weakness (generalized)  Other lack of coordination  Stress incontinence (female) (female)     Problem List Patient Active Problem List   Diagnosis Date Noted  . Hyperlipidemia 07/03/2019  . CKD (chronic kidney disease) 07/03/2019  . Tachypnea 04/29/2017  . 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 FOM (frequency of micturition) 07/06/2014  . Phlebitis of arm 10/20/2013  . Anxiety and depression 03/05/2013  . Bronchospasm 01/09/2013  . Annual physical exam 09/19/2012  . Edema of   legs, L>R 09/19/2012  . GERD (gastroesophageal reflux disease) 09/19/2012  . LEIOMYOMA OF UTERUS, UNSPECIFIED 10/13/2009  . ANEMIA, HYPOCHROMIC 10/13/2009  . Essential hypertension, benign 10/13/2009  . Recurrent UTI, h/o urethra diverticuli 10/13/2009  . DYSFUNCTIONAL UTERINE BLEEDING 10/13/2009    Earlie Counts, PT 05/04/20 10:29 AM   [place] [facility] at [facility] [address] [place], [place], [address] Phone:  [phone]   Fax:  [phone]  Name: Kristin Burnett MRN: [id number] Date of Birth: 1965/11/27

## 2020-05-18 ENCOUNTER — Encounter: Payer: Self-pay | Admitting: Physical Therapy

## 2020-05-20 ENCOUNTER — Other Ambulatory Visit: Payer: Self-pay | Admitting: Family Medicine

## 2020-05-20 DIAGNOSIS — I10 Essential (primary) hypertension: Secondary | ICD-10-CM

## 2020-05-20 DIAGNOSIS — K219 Gastro-esophageal reflux disease without esophagitis: Secondary | ICD-10-CM

## 2020-05-20 DIAGNOSIS — J320 Chronic maxillary sinusitis: Secondary | ICD-10-CM

## 2020-05-24 ENCOUNTER — Telehealth: Payer: Self-pay | Admitting: Internal Medicine

## 2020-05-24 NOTE — Telephone Encounter (Signed)
Pt is in need of the following refills:  amLODipine (NORVASC) 10 MG tablet  lisinopril-hydrochlorothiazide (ZESTORETIC) 20-25 MG tablet   omeprazole (PRILOSEC) 40 MG capsule  [place] ([place]), [place] (Ph: [phone]) Please advise and thank you

## 2020-05-25 ENCOUNTER — Encounter: Payer: Self-pay | Admitting: Physical Therapy

## 2020-05-25 ENCOUNTER — Encounter: Payer: BC Managed Care – PPO | Attending: Internal Medicine | Admitting: Physical Therapy

## 2020-05-25 ENCOUNTER — Encounter: Payer: Self-pay | Admitting: Physician Assistant

## 2020-05-25 ENCOUNTER — Telehealth (INDEPENDENT_AMBULATORY_CARE_PROVIDER_SITE_OTHER): Payer: BC Managed Care – PPO | Admitting: Physician Assistant

## 2020-05-25 ENCOUNTER — Other Ambulatory Visit: Payer: Self-pay

## 2020-05-25 DIAGNOSIS — N393 Stress incontinence (female) (male): Secondary | ICD-10-CM | POA: Diagnosis present

## 2020-05-25 DIAGNOSIS — R278 Other lack of coordination: Secondary | ICD-10-CM | POA: Insufficient documentation

## 2020-05-25 DIAGNOSIS — J320 Chronic maxillary sinusitis: Secondary | ICD-10-CM

## 2020-05-25 DIAGNOSIS — M6281 Muscle weakness (generalized): Secondary | ICD-10-CM | POA: Diagnosis present

## 2020-05-25 MED ORDER — FLUTICASONE PROPIONATE 50 MCG/ACT NA SUSP: 1.0000 | Freq: Every day | NASAL | 2 refills | Status: DC

## 2020-05-25 MED ORDER — AZITHROMYCIN 250 MG PO TABS: ORAL_TABLET | ORAL | 0 refills | Status: DC

## 2020-05-25 NOTE — Patient Instructions (Signed)
Access Code: [email or web address] URL: [email or web address] Date: 05/25/2020 Prepared by: Earlie Counts  Program Notes stop drinking fluids 2 hours prior to going to sleep and elevate legs 30 minutes prior to sleep NO sodas at night bladder irritatns- soda, citrus, spicy could contribute to urinary leakage   Exercises Supine Hamstring Stretch with Strap - 1 x daily - 3 x weekly - 1 sets - 2 reps - 30 sec hold Supine ITB Stretch with Strap - 1 x daily - 3 x weekly - 1 sets - 2 reps - 30 sec hold Supine Figure 4 Piriformis Stretch - 1 x daily - 3 x weekly - 1 sets - 2 reps - 30 sec hold Supine Butterfly Groin Stretch - 1 x daily - 3 x weekly - 1 sets - 1 reps - 1 min hold Standing Anti-Rotation Press with Anchored Resistance - 1 x daily - 7 x weekly - 2 sets - 10 reps Supine Bridge with Mini Swiss Ball Between Knees - 1 x daily - 7 x weekly - 1 sets - 10 reps Quadruped Pelvic Floor Contraction with Hip Adduction and Towel Roll - 1 x daily - 7 x weekly - 1 sets - 10 reps - 5 sec hold Prone Hip Extension Sequence - 1 x daily - 7 x weekly - 1 sets - 10 reps - 5 sec hold Seated Transversus Abdominis Bracing - 1 x daily - 7 x weekly - 1 sets - 10 reps - hold 5sec hold Hooklying Isometric Hip Flexion - 1 x daily - 7 x weekly - 2 sets - 10 reps - 5 sec hold Earlie Counts, PT [facility] [address], [place], [place] [address] W: [phone] [email or web address]

## 2020-05-25 NOTE — Progress Notes (Signed)
Patient verified DOB Patient complains of productive cough with greenish/ brown sputum. Patient denies N/V/Diarrhea. Patient denies fevers at home. Patient taste and smell is present with a good appetite. Patient complains of pressure HA's. Patient has used zyrtec last night which provided some relief. Patient needs refill on nasal spray.

## 2020-05-25 NOTE — Therapy (Addendum)
[place] at [facility] [address] [place], [place], [address] Phone: [phone]   Fax:  [phone]  Physical Therapy Treatment  Patient Details  Name: Kristin Burnett MRN: [id number] Date of Birth: 03/27/1966 Referring Provider (PT): Dr. Tia Masker   Encounter Date: 05/25/2020   PT End of Session - 05/25/20 0937    Visit Number 5    Date for PT Re-Evaluation 06/22/20    Authorization Type BC[employer]    PT Start Time 0930    PT Stop Time 1015    PT Time Calculation (min) 45 min    Activity Tolerance Patient tolerated treatment well    Behavior During Therapy [facility] for tasks assessed/performed           Past Medical History:  Diagnosis Date  . Bronchospasm 01/09/2013  . DUB (dysfunctional uterine bleeding)   . GERD (gastroesophageal reflux disease)    otc tums  . History of blood transfusion 2011   "related to fibrods"  . Hypertension   . Pneumonia ~ 2015; 04/29/2017  . Recurrent UTI, h/o urethra diverticuli 10/13/2009   Qualifier: Diagnosis of  By: Stanford Scotland MD, Jiles Garter    . Urethral diverticulum    Dr. Estill Dooms  . Yeast vaginitis    Dr. Estill Dooms    Past Surgical History:  Procedure Laterality Date  . BILATERAL SALPINGECTOMY  03/07/2012   Procedure: BILATERAL SALPINGECTOMY;  Surgeon: Lavonia Drafts, MD;  Location: [place] [facility];  Service: Gynecology;  Laterality: Bilateral;  . CYSTOSCOPY  03/07/2012   Procedure: CYSTOSCOPY;  Surgeon: Lavonia Drafts, MD;  Location: [place] [facility];  Service: Gynecology;  Laterality: N/A;  . LASER ABLATION     "before they took my uterus out"  . ROBOTIC ASSISTED TOTAL HYSTERECTOMY  03/07/2012   Procedure: ROBOTIC ASSISTED TOTAL HYSTERECTOMY;  Surgeon: Lavonia Drafts, MD;  Location: [place] [facility];  Service: Gynecology;  Laterality: N/A;  . TUBAL LIGATION    . URETHRAL DIVERTICULECTOMY    . VIDEO BRONCHOSCOPY Bilateral 05/01/2017   Procedure: VIDEO BRONCHOSCOPY WITH FLUORO;   Surgeon: Marshell Garfinkel, MD;  Location: [facility];  Service: Cardiopulmonary;  Laterality: Bilateral;    There were no vitals filed for this visit.   Subjective Assessment - 05/25/20 0935    Subjective I have been leaking with the coughing. I have been doing a little bit of exercises. I do them 1-2 times per week. I do not have to run to the bathroom and hold it longer.    Patient Stated Goals reduce leakage    Currently in Pain? No/denies              [facility] PT Assessment - 05/25/20 0001      Assessment   Medical Diagnosis N39.3 Stress incontinence of urine    Referring Provider (PT) Dr. Tia Masker    Onset Date/Surgical Date --   past year is worse   Prior Therapy none      Strength   Right Hip Flexion 4/5    Right Hip Extension 4/5    Right Hip ABduction 5/5    Right Hip ADduction 5/5    Left Hip Flexion 4/5    Left Hip Extension 4/5    Left Hip ABduction 4/5    Left Hip ADduction 4/5      Palpation   Palpation comment no tenderness suprapubically                      Pelvic Floor Special  Questions - 05/25/20 0001    Pelvic Floor Internal Exam Patient confirms identification and approves PT to assess pelvic floor and treatment    Exam Type Vaginal             [facility] Adult PT Treatment/Exercise - 05/25/20 0001      Self-Care   Self-Care Other Self-Care Comments    Other Self-Care Comments  dicussed with patient on her exercise program and how to incoportate it in her day and adding walking on the treadmill; education on bladder irritants and how they could affect the bladder      Lumbar Exercises: Supine   Bridge with Diona Foley Squeeze 10 reps;5 seconds    Bridge with [employer] Limitations focus on lifting her hips all the way    Isometric Hip Flexion 20 reps;3 seconds    Isometric Hip Flexion Limitations focus on lower abdominal contraction    Other Supine Lumbar Exercises hookly press the shoulders and arms into the mat with a  ball squeeze holding 5 sec with verbal cues to contract the pelvic floor and lower abdomen      Lumbar Exercises: Quadruped   Other Quadruped Lumbar Exercises Pallof with ball squeeze using red band 10x each side      Manual Therapy   Manual Therapy Passive ROM    Internal Pelvic Floor passively stretch for increaseing hip flexion and external rotation                  PT Education - 05/25/20 1023    Education Details Access Code: [email or web address]; went over how to incorporate her exercise routine into her schedule, education on bladder irritants    Person(s) Educated Patient    Methods Explanation;Demonstration;Verbal cues;Handout    Comprehension Returned demonstration;Verbalized understanding            PT Short Term Goals - 05/25/20 0938      PT SHORT TERM GOAL #3   Title urinary leakage decreased >/= 25% due to improved tissue mobitlity    Time 4    Period Weeks    Status Achieved             PT Long Term Goals - 05/25/20 4656      PT LONG TERM GOAL #1   Title independent with advanced HEP    Time 12    Period Weeks    Status On-going      PT LONG TERM GOAL #2   Title able to contract the pelvic floor and lift to reduce urinary leakage >/= 75%    Time 12    Period Weeks    Status On-going      PT LONG TERM GOAL #3   Title able to walk slowly to the bathroom without leaking urine due to improved pelvic floor coordination    Time 12    Period Weeks    Status Achieved      PT LONG TERM GOAL #4   Title reduce her pads to 1 light day during the day and none at night due to reduction of urinary leakage    Baseline wears 2-3 pads    Time 12    Period Weeks    Status On-going                 Plan - 05/25/20 1027    Clinical Impression Statement Patient has not been consistent with her home exercise program. Patient is able to walk to the bathroom slowly without leakage. Patient  continues to leak at night with no changes. During the day her leakage  is 25% better. Patient has difficulty with contracting her lower abdominals and will hold her breath with exercise. Patient home exercise program was reviewed and discussed how she can incorporate her exercises into her schedule. Patient understands what bladder irritants are and how they affect her bladder. Patient will benefit from skilled therapy to improve pelvic floor coordination and strength to reduce her urinary leakage especially at night.    Personal Factors and Comorbidities Comorbidity 2;Age;Fitness    Comorbidities Robotic Assisted Hysterectomy 03/07/2012; Urethral Diverticulectomy    Examination-Activity Limitations Continence;Hygiene/Grooming;Toileting    Examination-Participation Restrictions Community Activity    Stability/Clinical Decision Making Stable/Uncomplicated    Rehab Potential Excellent    PT Frequency 1x / week    PT Duration 12 weeks    PT Treatment/Interventions ADLs/Self Care Home Management;Biofeedback;Electrical Stimulation;Moist Heat;Ultrasound;Therapeutic activities;Therapeutic exercise;Neuromuscular re-education;Manual techniques;Patient/family education;Dry needling    PT Next Visit Plan see how the night time leakage , quadruped hinging, sitting cross abdominal contraction; see if she is to continue    PT Home Exercise Plan Access Code: [email or web address]    Consulted and Agree with Plan of Care Patient           Patient will benefit from skilled therapeutic intervention in order to improve the following deficits and impairments:  Decreased coordination,Increased fascial restricitons,Decreased endurance,Decreased activity tolerance,Decreased strength  Visit Diagnosis: Muscle weakness (generalized)  Other lack of coordination  Stress incontinence (female) (female)     Problem List Patient Active Problem List   Diagnosis Date Noted  . Hyperlipidemia 07/03/2019  . CKD (chronic kidney disease) 07/03/2019  . Tachypnea 04/29/2017  . Urethra, diverticulum  07/06/2014  . FOM (frequency of micturition) 07/06/2014  . Phlebitis of arm 10/20/2013  . Anxiety and depression 03/05/2013  . Bronchospasm 01/09/2013  . Annual physical exam 09/19/2012  . Edema of   legs, L>R 09/19/2012  . GERD (gastroesophageal reflux disease) 09/19/2012  . LEIOMYOMA OF UTERUS, UNSPECIFIED 10/13/2009  . ANEMIA, HYPOCHROMIC 10/13/2009  . Essential hypertension, benign 10/13/2009  . Recurrent UTI, h/o urethra diverticuli 10/13/2009  . DYSFUNCTIONAL UTERINE BLEEDING 10/13/2009    Earlie Counts, PT 05/25/20 10:33 AM   [place] at [facility] [address], [place], [place], [address] Phone: [phone]   Fax:  [phone]  Name: DESIREA MIZRAHI MRN: [id number] Date of Birth: Jan 19, 1966  PHYSICAL THERAPY DISCHARGE SUMMARY  Visits from Start of Care: 5  Current functional level related to goals / functional outcomes: See above.    Remaining deficits: See above. Unable to assess patient due to her not returning to therapy.    Education / Equipment: HEP Plan:                                                    Patient goals were not met. Patient is being discharged due to not returning since the last visit.  Thank you for the referral. Earlie Counts, PT 06/22/20 8:17 AM  ?????

## 2020-05-25 NOTE — Progress Notes (Signed)
Established Patient Office Visit  Subjective:  Patient ID: Kristin Burnett, female    DOB: 09/09/1965  Age: 55 y.o. MRN: [id number]  CC:  Chief Complaint  Patient presents with  . URI   Virtual Visit via Telephone Note  I connected with Kristin Burnett on 05/25/20 at 11:10 AM EST by telephone and verified that I am speaking with the correct person using two identifiers.  Location: Patient: Home Provider: Primary Care at [facility]   I discussed the limitations, risks, security and privacy concerns of performing an evaluation and management service by telephone and the availability of in person appointments. I also discussed with the patient that there may be a patient responsible charge related to this service. The patient expressed understanding and agreed to proceed.   History of Present Illness: Reports that she has been having a headache, pressure in both ears, green and bloody nasal discharge for the past few days.  Endorses history of sinus infections.  States that she has been using Zyrtec with some relief, has been out of her Flonase.  Denies any sick contacts.  Endorses two Moderna Covid vaccines with last one in July 2021.    Observations/Objective:  Medical history and current medications reviewed, no physical exam completed   Past Medical History:  Diagnosis Date  . Bronchospasm 01/09/2013  . DUB (dysfunctional uterine bleeding)   . GERD (gastroesophageal reflux disease)    otc tums  . History of blood transfusion 2011   "related to fibrods"  . Hypertension   . Pneumonia ~ 2015; 04/29/2017  . Recurrent UTI, h/o urethra diverticuli 10/13/2009   Qualifier: Diagnosis of  By: [name] MD, Tillie Rung    . Urethral diverticulum    Dr. Lindley Magnus  . Yeast vaginitis    Dr. Lindley Magnus    Past Surgical History:  Procedure Laterality Date  . BILATERAL SALPINGECTOMY  03/07/2012   Procedure: BILATERAL SALPINGECTOMY;  Surgeon: [name], MD;  Location: [facility];  Service:  Gynecology;  Laterality: Bilateral;  . CYSTOSCOPY  03/07/2012   Procedure: CYSTOSCOPY;  Surgeon: [name], MD;  Location: [facility];  Service: Gynecology;  Laterality: N/A;  . LASER ABLATION     "before they took my uterus out"  . ROBOTIC ASSISTED TOTAL HYSTERECTOMY  03/07/2012   Procedure: ROBOTIC ASSISTED TOTAL HYSTERECTOMY;  Surgeon: [name], MD;  Location: [facility];  Service: Gynecology;  Laterality: N/A;  . TUBAL LIGATION    . URETHRAL DIVERTICULECTOMY    . VIDEO BRONCHOSCOPY Bilateral 05/01/2017   Procedure: VIDEO BRONCHOSCOPY WITH FLUORO;  Surgeon: [name], MD;  Location: [facility];  Service: Cardiopulmonary;  Laterality: Bilateral;    Family History  Problem Relation Age of Onset  . Hypertension Mother   . Arthritis Mother   . Anemia Mother   . Cancer Mother        uterine?  . Colon cancer Mother 11  . Diabetes Father   . Stroke Father 92  . Heart disease Father        has a defib  . Hypertension Father   . Heart failure Father   . Heart disease Brother   . Heart failure Brother   . Leukemia Maternal Grandmother   . Breast cancer Neg Hx   . Esophageal cancer Neg Hx   . Stomach cancer Neg Hx   . Rectal cancer Neg Hx   . Colon polyps Neg Hx     Social History   Socioeconomic History  . Marital status: Divorced  Spouse name: Not on file  . Number of children: 3  . Years of education: Not on file  . Highest education level: Not on file  Occupational History  . Occupation: bus Air traffic controller: [employer]  Tobacco Use  . Smoking status: Current Some Day Smoker    Packs/day: 1.00    Years: 36.00    Pack years: 36.00    Types: Cigarettes    Start date: 06/21/1986  . Smokeless tobacco: Never Used  . Tobacco comment: 1 pack/day  Vaping Use  . Vaping Use: Never used  Substance and Sexual Activity  . Alcohol use: No    Alcohol/week: 0.0 standard drinks  . Drug use: No  . Sexual activity: Not Currently    Birth  control/protection: Surgical  Other Topics Concern  . Not on file  Social History Narrative   Household: pt and 2 children   Regular exercise: was; not been in 1 mth   Caffeine use: coffee daily   Social Determinants of Health   Financial Resource Strain: Not on file  Food Insecurity: Not on file  Transportation Needs: Not on file  Physical Activity: Not on file  Stress: Not on file  Social Connections: Not on file  Intimate Partner Violence: Not on file    Outpatient Medications Prior to Visit  Medication Sig Dispense Refill  . amLODipine (NORVASC) 10 MG tablet Take 1 tablet (10 mg total) by mouth daily. 90 tablet 1  . atorvastatin (LIPITOR) 20 MG tablet Take 1 tablet (20 mg total) by mouth daily. 90 tablet 3  . cetirizine (ZYRTEC) 10 MG tablet Take 1 tablet (10 mg total) by mouth daily as needed. 90 tablet 3  . lisinopril-hydrochlorothiazide (ZESTORETIC) 20-25 MG tablet Take 1 tablet by mouth daily. 90 tablet 1  . omeprazole (PRILOSEC) 40 MG capsule Take 1 capsule by mouth once daily 90 capsule 1  . varenicline (CHANTIX CONTINUING MONTH PAK) 1 MG tablet Take 1 tablet (1 mg total) by mouth 2 (two) times daily. 60 tablet 2  . fluticasone (FLONASE) 50 MCG/ACT nasal spray Place 1 spray into both nostrils daily. 16 g 2  . metroNIDAZOLE (FLAGYL) 500 MG tablet Take 1 tablet (500 mg total) by mouth 2 (two) times daily. 14 tablet 0   No facility-administered medications prior to visit.    No Known Allergies  ROS Review of Systems  Constitutional: Negative for chills and fever.  HENT: Positive for congestion and sinus pressure. Negative for sore throat and trouble swallowing.   Eyes: Negative.   Respiratory: Negative for cough.   Gastrointestinal: Negative.  Negative for diarrhea, nausea and vomiting.  Endocrine: Negative.   Genitourinary: Negative.   Musculoskeletal: Negative.  Negative for myalgias.  Skin: Negative.   Allergic/Immunologic: Negative.   Neurological: Positive  for headaches.  Hematological: Negative.   Psychiatric/Behavioral: Negative.       Objective:     LMP 02/29/2012 (Exact Date)  Wt Readings from Last 3 Encounters:  03/03/20 252 lb (114.3 kg)  01/28/20 253 lb (114.8 kg)  04/03/19 245 lb (111.1 kg)     Health Maintenance Due  Topic Date Due  . MAMMOGRAM  12/26/2017  . COVID-19 Vaccine (3 - Moderna risk 4-dose series) 11/24/2019    There are no preventive care reminders to display for this patient.  Lab Results  Component Value Date   TSH 1.340 10/02/2018   Lab Results  Component Value Date   WBC 13.3 (H) 01/28/2020   HGB 13.1  01/28/2020   HCT 38.9 01/28/2020   MCV 87 01/28/2020   PLT 325 01/28/2020   Lab Results  Component Value Date   NA 138 01/28/2020   K 3.7 01/28/2020   CO2 23 01/28/2020   GLUCOSE 133 (H) 01/28/2020   BUN 23 01/28/2020   CREATININE 1.35 (H) 01/28/2020   BILITOT 0.4 01/28/2020   ALKPHOS 428 (H) 01/28/2020   AST 40 01/28/2020   ALT 51 (H) 01/28/2020   PROT 8.2 01/28/2020   ALBUMIN 4.4 01/28/2020   CALCIUM 10.0 01/28/2020   ANIONGAP 6 05/02/2017   GFR 54.13 (L) 09/07/2014   Lab Results  Component Value Date   CHOL 171 04/03/2019   Lab Results  Component Value Date   HDL 40 04/03/2019   Lab Results  Component Value Date   LDLCALC 106 (H) 04/03/2019   Lab Results  Component Value Date   TRIG 143 04/03/2019   Lab Results  Component Value Date   CHOLHDL 4.3 04/03/2019   Lab Results  Component Value Date   HGBA1C 5.1 10/02/2018      Assessment & Plan:   Problem List Items Addressed This Visit   None   Visit Diagnoses    Chronic maxillary sinusitis    -  Primary   Relevant Medications   fluticasone (FLONASE) 50 MCG/ACT nasal spray   azithromycin (ZITHROMAX) 250 MG tablet     Assessment and Plan: 1. Chronic maxillary sinusitis Patient encouraged to present to clinic for rapid Covid testing.  Trial azithromycin, continue symptomatic treatment.  Red flags given for  prompt reevaluation - fluticasone (FLONASE) 50 MCG/ACT nasal spray; Place 1 spray into both nostrils daily.  Dispense: 16 g; Refill: 2 - azithromycin (ZITHROMAX) 250 MG tablet; Take 2 tabs PO day 1, then take 1 tab PO once daily  Dispense: 6 tablet; Refill: 0   Follow Up Instructions:    I discussed the assessment and treatment plan with the patient. The patient was provided an opportunity to ask questions and all were answered. The patient agreed with the plan and demonstrated an understanding of the instructions.   The patient was advised to call back or seek an in-person evaluation if the symptoms worsen or if the condition fails to improve as anticipated.  I provided 21 minutes of non-face-to-face time during this encounter.     Meds ordered this encounter  Medications  . fluticasone (FLONASE) 50 MCG/ACT nasal spray    Sig: Place 1 spray into both nostrils daily.    Dispense:  16 g    Refill:  2    Order Specific Question:   Supervising Provider    Answer:   WRIGHT, PATRICK E [[id number]]  . azithromycin (ZITHROMAX) 250 MG tablet    Sig: Take 2 tabs PO day 1, then take 1 tab PO once daily    Dispense:  6 tablet    Refill:  0    Order Specific Question:   Supervising Provider    Answer:   Storm Frisk [[id number]]    Follow-up: Return if symptoms worsen or fail to improve.    Kasandra Knudsen Mayers, PA-C

## 2020-05-26 ENCOUNTER — Telehealth: Payer: Self-pay | Admitting: *Deleted

## 2020-05-26 ENCOUNTER — Encounter: Payer: Self-pay | Admitting: Physician Assistant

## 2020-05-26 ENCOUNTER — Other Ambulatory Visit: Payer: BC Managed Care – PPO

## 2020-05-26 DIAGNOSIS — R0989 Other specified symptoms and signs involving the circulatory and respiratory systems: Secondary | ICD-10-CM | POA: Diagnosis not present

## 2020-05-26 DIAGNOSIS — T3695XA Adverse effect of unspecified systemic antibiotic, initial encounter: Secondary | ICD-10-CM

## 2020-05-26 NOTE — Addendum Note (Signed)
Addended by: Trecia Rogers on: 05/26/2020 03:00 PM   Modules accepted: Orders

## 2020-05-26 NOTE — Progress Notes (Signed)
Patient presents for COVID testing

## 2020-05-26 NOTE — Patient Instructions (Addendum)
Your rapid COVID test was Negative, please continue with treatment plan we discussed yesterday.  I hope that you feel better soon, please let us know if there is anything else we can do for you   Kennieth Rad, PA-C Physician Assistant [facility] [email or web address]    Sinusitis, Adult Sinusitis is inflammation of your sinuses. Sinuses are hollow spaces in the bones around your face. Your sinuses are located:  Around your eyes.  In the middle of your forehead.  Behind your nose.  In your cheekbones. Mucus normally drains out of your sinuses. When your nasal tissues become inflamed or swollen, mucus can become trapped or blocked. This allows bacteria, viruses, and fungi to grow, which leads to infection. Most infections of the sinuses are caused by a virus. Sinusitis can develop quickly. It can last for up to 4 weeks (acute) or for more than 12 weeks (chronic). Sinusitis often develops after a cold. What are the causes? This condition is caused by anything that creates swelling in the sinuses or stops mucus from draining. This includes:  Allergies.  Asthma.  Infection from bacteria or viruses.  Deformities or blockages in your nose or sinuses.  Abnormal growths in the nose (nasal polyps).  Pollutants, such as chemicals or irritants in the air.  Infection from fungi (rare). What increases the risk? You are more likely to develop this condition if you:  Have a weak body defense system (immune system).  Do a lot of swimming or diving.  Overuse nasal sprays.  Smoke. What are the signs or symptoms? The main symptoms of this condition are pain and a feeling of pressure around the affected sinuses. Other symptoms include:  Stuffy nose or congestion.  Thick drainage from your nose.  Swelling and warmth over the affected sinuses.  Headache.  Upper toothache.  A cough that may get worse at night.  Extra  mucus that collects in the throat or the back of the nose (postnasal drip).  Decreased sense of smell and taste.  Fatigue.  A fever.  Sore throat.  Bad breath. How is this diagnosed? This condition is diagnosed based on:  Your symptoms.  Your medical history.  A physical exam.  Tests to find out if your condition is acute or chronic. This may include: ? Checking your nose for nasal polyps. ? Viewing your sinuses using a device that has a light (endoscope). ? Testing for allergies or bacteria. ? Imaging tests, such as an MRI or CT scan. In rare cases, a bone biopsy may be done to rule out more serious types of fungal sinus disease. How is this treated? Treatment for sinusitis depends on the cause and whether your condition is chronic or acute.  If caused by a virus, your symptoms should go away on their own within 10 days. You may be given medicines to relieve symptoms. They include: ? Medicines that shrink swollen nasal passages (topical intranasal decongestants). ? Medicines that treat allergies (antihistamines). ? A spray that eases inflammation of the nostrils (topical intranasal corticosteroids). ? Rinses that help get rid of thick mucus in your nose (nasal saline washes).  If caused by bacteria, your health care provider may recommend waiting to see if your symptoms improve. Most bacterial infections will get better without antibiotic medicine. You may be given antibiotics if you have: ? A severe infection. ? A weak immune system.  If caused by narrow nasal passages or nasal polyps, you may need to have surgery. Follow these instructions  at home: Medicines  Take, use, or apply over-the-counter and prescription medicines only as told by your health care provider. These may include nasal sprays.  If you were prescribed an antibiotic medicine, take it as told by your health care provider. Do not stop taking the antibiotic even if you start to feel better. Hydrate and  humidify  Drink enough fluid to keep your urine pale yellow. Staying hydrated will help to thin your mucus.  Use a cool mist humidifier to keep the humidity level in your home above 50%.  Inhale steam for 10-15 minutes, 3-4 times a day, or as told by your health care provider. You can do this in the bathroom while a hot shower is running.  Limit your exposure to cool or dry air.   Rest  Rest as much as possible.  Sleep with your head raised (elevated).  Make sure you get enough sleep each night. General instructions  Apply a warm, moist washcloth to your face 3-4 times a day or as told by your health care provider. This will help with discomfort.  Wash your hands often with soap and water to reduce your exposure to germs. If soap and water are not available, use hand sanitizer.  Do not smoke. Avoid being around people who are smoking (secondhand smoke).  Keep all follow-up visits as told by your health care provider. This is important.   Contact a health care provider if:  You have a fever.  Your symptoms get worse.  Your symptoms do not improve within 10 days. Get help right away if:  You have a severe headache.  You have persistent vomiting.  You have severe pain or swelling around your face or eyes.  You have vision problems.  You develop confusion.  Your neck is stiff.  You have trouble breathing. Summary  Sinusitis is soreness and inflammation of your sinuses. Sinuses are hollow spaces in the bones around your face.  This condition is caused by nasal tissues that become inflamed or swollen. The swelling traps or blocks the flow of mucus. This allows bacteria, viruses, and fungi to grow, which leads to infection.  If you were prescribed an antibiotic medicine, take it as told by your health care provider. Do not stop taking the antibiotic even if you start to feel better.  Keep all follow-up visits as told by your health care provider. This is  important. This information is not intended to replace advice given to you by your health care provider. Make sure you discuss any questions you have with your health care provider. Document Revised: 09/10/2017 Document Reviewed: 09/10/2017 Elsevier Patient Education  2021 [employer].

## 2020-05-26 NOTE — Telephone Encounter (Signed)
Duplicate request, will be addressed by [facility] provider who pt had a visit with this AM

## 2020-05-27 ENCOUNTER — Encounter: Payer: Self-pay | Admitting: *Deleted

## 2020-05-27 LAB — POC COVID19 BINAXNOW: SARS Coronavirus 2 Ag: NEGATIVE

## 2020-05-27 MED ORDER — FLUCONAZOLE 150 MG PO TABS: 150.0000 mg | ORAL_TABLET | Freq: Once | ORAL | 0 refills | Status: AC

## 2020-05-27 NOTE — Telephone Encounter (Signed)
Per patient request

## 2020-06-01 ENCOUNTER — Encounter: Payer: BC Managed Care – PPO | Admitting: Physical Therapy

## 2020-06-23 ENCOUNTER — Encounter: Payer: Self-pay | Admitting: Physician Assistant

## 2020-06-23 ENCOUNTER — Ambulatory Visit: Payer: BC Managed Care – PPO | Admitting: Physician Assistant

## 2020-06-23 ENCOUNTER — Other Ambulatory Visit: Payer: Self-pay

## 2020-06-23 DIAGNOSIS — L918 Other hypertrophic disorders of the skin: Secondary | ICD-10-CM

## 2020-06-23 DIAGNOSIS — D485 Neoplasm of uncertain behavior of skin: Secondary | ICD-10-CM

## 2020-06-23 DIAGNOSIS — L821 Other seborrheic keratosis: Secondary | ICD-10-CM

## 2020-06-25 ENCOUNTER — Ambulatory Visit: Payer: BC Managed Care – PPO | Admitting: Physician Assistant

## 2020-06-28 ENCOUNTER — Other Ambulatory Visit: Payer: Self-pay

## 2020-06-28 ENCOUNTER — Telehealth: Payer: Self-pay | Admitting: Internal Medicine

## 2020-06-28 DIAGNOSIS — I10 Essential (primary) hypertension: Secondary | ICD-10-CM

## 2020-06-28 MED ORDER — AMLODIPINE BESYLATE 10 MG PO TABS: 10.0000 mg | ORAL_TABLET | Freq: Every day | ORAL | 0 refills | Status: DC

## 2020-06-28 MED ORDER — LISINOPRIL-HYDROCHLOROTHIAZIDE 20-25 MG PO TABS: 1.0000 | ORAL_TABLET | Freq: Every day | ORAL | 0 refills | Status: DC

## 2020-06-28 NOTE — Telephone Encounter (Signed)
Pt is out of the following medication, and needs refill   amLODipine (NORVASC) 10 MG tablet  lisinopril-hydrochlorothiazide (ZESTORETIC) 20-25 MG tablet  [facility] on [address]  Thank you

## 2020-06-28 NOTE — Telephone Encounter (Signed)
RF sent.

## 2020-07-12 ENCOUNTER — Encounter: Payer: Self-pay | Admitting: Physician Assistant

## 2020-07-12 NOTE — Progress Notes (Signed)
   New Patient   Subjective  Kristin Burnett is a 55 y.o. female who presents for the following: Skin Problem (Left lower lip x 5 months- no change- " just there" ).   The following portions of the chart were reviewed this encounter and updated as appropriate:      Objective  Well appearing patient in no apparent distress; mood and affect are within normal limits.  A focused examination was performed including face. Relevant physical exam findings are noted in the Assessment and Plan.  Objective  Left Malar Cheek: Multiple Stuck-on, waxy, tan-brown papules and plaques. --Discussed benign etiology and prognosis.   Objective  Left Lower Vermilion Lip: White papule     Objective  Left Supraclavicular Area: Fleshy, skin-colored sessile and pedunculated papules.     Assessment & Plan  Seborrheic keratosis Left Malar Cheek  observe  Neoplasm of uncertain behavior of skin Left Lower Vermilion Lip  Skin / nail biopsy Type of biopsy: tangential   Informed consent: discussed and consent obtained   Timeout: patient name, date of birth, surgical site, and procedure verified   Procedure prep:  Patient was prepped and draped in usual sterile fashion (Non sterile) Prep type:  Chlorhexidine Anesthesia: the lesion was anesthetized in a standard fashion   Anesthetic:  1% lidocaine w/ epinephrine 1-100,000 local infiltration Instrument used: flexible razor blade   Outcome: patient tolerated procedure well   Post-procedure details: wound care instructions given    Specimen 1 - Surgical pathology Differential Diagnosis: r/o angioma, milia  (2 specimens) Check Margins: No  Skin tag Left Supraclavicular Area  observe     I, Zuha Dejonge, PA-C, have reviewed all documentation for this visit. The documentation on 07/12/20 for the exam, diagnosis, procedures, and orders are all accurate and complete.

## 2020-08-04 ENCOUNTER — Ambulatory Visit: Payer: BC Managed Care – PPO | Admitting: Physician Assistant

## 2020-08-04 ENCOUNTER — Other Ambulatory Visit: Payer: Self-pay

## 2020-08-04 VITALS — BP 169/105 | HR 93 | Temp 98.7°F | Resp 18 | Ht 67.0 in | Wt 250.0 lb

## 2020-08-04 DIAGNOSIS — I10 Essential (primary) hypertension: Secondary | ICD-10-CM

## 2020-08-04 DIAGNOSIS — N390 Urinary tract infection, site not specified: Secondary | ICD-10-CM

## 2020-08-04 MED ORDER — NITROFURANTOIN MONOHYD MACRO 100 MG PO CAPS: 100.0000 mg | ORAL_CAPSULE | Freq: Two times a day (BID) | ORAL | 0 refills | Status: AC

## 2020-08-04 NOTE — Progress Notes (Signed)
Established Patient Office Visit  Subjective:  Patient ID: Kristin Burnett, female    DOB: Aug 02, 1965  Age: 55 y.o. MRN: [id number]  CC: No chief complaint on file.   HPI  Kristin Burnett reports that she started having dysuria last night, states that it has been worse today.  Reports that she has also had some vaginal itching.  Denies abdominal pain, or lower back pain.  Endorses history of urinary tract infections.  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  Denies hypertensive symptoms.      Past Medical History:  Diagnosis Date  . Bronchospasm 01/09/2013  . DUB (dysfunctional uterine bleeding)   . GERD (gastroesophageal reflux disease)    otc tums  . History of blood transfusion 2011   "related to fibrods"  . Hypertension   . Pneumonia ~ 2015; 04/29/2017  . Recurrent UTI, h/o urethra diverticuli 10/13/2009   Qualifier: Diagnosis of  By: Stanford Scotland MD, Jiles Garter    . Urethral diverticulum    Dr. Estill Dooms  . Yeast vaginitis    Dr. Estill Dooms    Past Surgical History:  Procedure Laterality Date  . BILATERAL SALPINGECTOMY  03/07/2012   Procedure: BILATERAL SALPINGECTOMY;  Surgeon: Lavonia Drafts, MD;  Location: [facility];  Service: Gynecology;  Laterality: Bilateral;  . CYSTOSCOPY  03/07/2012   Procedure: CYSTOSCOPY;  Surgeon: Lavonia Drafts, MD;  Location: [facility];  Service: Gynecology;  Laterality: N/A;  . LASER ABLATION     "before they took my uterus out"  . ROBOTIC ASSISTED TOTAL HYSTERECTOMY  03/07/2012   Procedure: ROBOTIC ASSISTED TOTAL HYSTERECTOMY;  Surgeon: Lavonia Drafts, MD;  Location: [facility];  Service: Gynecology;  Laterality: N/A;  . TUBAL LIGATION    . URETHRAL DIVERTICULECTOMY    . VIDEO BRONCHOSCOPY Bilateral 05/01/2017   Procedure: VIDEO BRONCHOSCOPY WITH FLUORO;  Surgeon: Marshell Garfinkel, MD;  Location: [facility];  Service: Cardiopulmonary;  Laterality: Bilateral;    Family History  Problem Relation Age of Onset  . Hypertension Mother   . Arthritis Mother   . Anemia Mother   . Cancer Mother        uterine?  . Colon cancer Mother 11  . Diabetes Father   . Stroke Father 49  . Heart disease Father        has a defib  . Hypertension Father   . Heart failure Father   . Heart disease Brother   . Heart failure Brother   . Leukemia Maternal Grandmother   . Breast cancer Neg Hx   . Esophageal cancer Neg Hx   . Stomach cancer Neg Hx   . Rectal cancer Neg Hx   . Colon polyps Neg Hx     Social History   Socioeconomic History  . Marital status: Divorced    Spouse name: Not on file  . Number of children: 3  . Years of education: Not on file  . Highest education level: Not on file  Occupational History  . Occupation: bus Education administrator: [employer]  Tobacco Use  . Smoking status: Current Some Day Smoker    Packs/day: 1.00    Years: 36.00    Pack years: 36.00    Types: Cigarettes    Start date: 06/21/1986  . Smokeless tobacco: Never Used  . Tobacco comment:  1 pack/day  Vaping Use  . Vaping Use: Never used  Substance and Sexual Activity  . Alcohol use: No    Alcohol/week: 0.0 standard drinks  . Drug use: No  . Sexual activity: Not Currently    Birth control/protection: Surgical  Other Topics Concern  . Not on file  Social History Narrative   Household: pt and 2 children   Regular exercise: was; not been in 1 mth   Caffeine use: coffee daily   Social Determinants of Health   Financial Resource Strain: Not on file  Food Insecurity: Not on file  Transportation Needs: Not on file  Physical Activity: Not on file  Stress: Not on file  Social Connections: Not on file  Intimate Partner Violence: Not on file    Outpatient Medications Prior to Visit  Medication Sig Dispense Refill  . amLODipine (NORVASC) 10 MG tablet Take 1  tablet (10 mg total) by mouth daily. 30 tablet 0  . atorvastatin (LIPITOR) 20 MG tablet Take 1 tablet (20 mg total) by mouth daily. 90 tablet 3  . azithromycin (ZITHROMAX) 250 MG tablet Take 2 tabs PO day 1, then take 1 tab PO once daily 6 tablet 0  . cetirizine (ZYRTEC) 10 MG tablet Take 1 tablet (10 mg total) by mouth daily as needed. 90 tablet 3  . fluticasone (FLONASE) 50 MCG/ACT nasal spray Place 1 spray into both nostrils daily. 16 g 2  . lisinopril-hydrochlorothiazide (ZESTORETIC) 20-25 MG tablet Take 1 tablet by mouth daily. 30 tablet 0  . omeprazole (PRILOSEC) 40 MG capsule Take 1 capsule by mouth once daily 90 capsule 1  . varenicline (CHANTIX CONTINUING MONTH PAK) 1 MG tablet Take 1 tablet (1 mg total) by mouth 2 (two) times daily. 60 tablet 2   No facility-administered medications prior to visit.    No Known Allergies  ROS Review of Systems  Constitutional: Negative for chills and fever.  HENT: Negative.   Eyes: Negative.   Respiratory: Negative for shortness of breath.   Cardiovascular: Negative for chest pain.  Gastrointestinal: Negative for abdominal pain, nausea and vomiting.  Endocrine: Negative.   Genitourinary: Positive for dysuria, frequency and vaginal discharge. Negative for hematuria.  Musculoskeletal: Negative for back pain.  Skin: Negative.   Allergic/Immunologic: Negative.   Neurological: Negative.   Hematological: Negative.   Psychiatric/Behavioral: Negative.       Objective:    Physical Exam Vitals and nursing note reviewed.  Constitutional:      Appearance: Normal appearance.  HENT:     Head: Normocephalic and atraumatic.     Right Ear: External ear normal.     Left Ear: External ear normal.     Nose: Nose normal.     Mouth/Throat:     Mouth: Mucous membranes are moist.     Pharynx: Oropharynx is clear.  Eyes:     Extraocular Movements: Extraocular movements intact.     Conjunctiva/sclera: Conjunctivae normal.     Pupils: Pupils are  equal, round, and reactive to light.  Cardiovascular:     Rate and Rhythm: Normal rate and regular rhythm.     Pulses: Normal pulses.     Heart sounds: Normal heart sounds.  Pulmonary:     Effort: Pulmonary effort is normal.     Breath sounds: Normal breath sounds.  Abdominal:     Tenderness: There is no abdominal tenderness. There is no right CVA tenderness or left CVA tenderness.  Musculoskeletal:        General: Normal  range of motion.     Cervical back: Normal range of motion and neck supple.  Skin:    General: Skin is warm and dry.  Neurological:     General: No focal deficit present.     Mental Status: She is alert and oriented to person, place, and time.  Psychiatric:        Mood and Affect: Mood normal.        Behavior: Behavior normal.        Thought Content: Thought content normal.        Judgment: Judgment normal.     BP (!) 169/105 (BP Location: Left Arm, Patient Position: Sitting, Cuff Size: Normal)   Pulse 93   Temp 98.7 F (37.1 C) (Oral)   Resp 18   Ht 5\' 7"  (1.702 m)   Wt 250 lb (113.4 kg)   LMP 02/29/2012 (Exact Date)   BMI 39.16 kg/m  Wt Readings from Last 3 Encounters:  08/04/20 250 lb (113.4 kg)  03/03/20 252 lb (114.3 kg)  01/28/20 253 lb (114.8 kg)     Health Maintenance Due  Topic Date Due  . MAMMOGRAM  12/26/2017  . COVID-19 Vaccine (3 - Moderna risk 4-dose series) 11/24/2019    There are no preventive care reminders to display for this patient.  Lab Results  Component Value Date   TSH 1.340 10/02/2018   Lab Results  Component Value Date   WBC 13.3 (H) 01/28/2020   HGB 13.1 01/28/2020   HCT 38.9 01/28/2020   MCV 87 01/28/2020   PLT 325 01/28/2020   Lab Results  Component Value Date   NA 138 01/28/2020   K 3.7 01/28/2020   CO2 23 01/28/2020   GLUCOSE 133 (H) 01/28/2020   BUN 23 01/28/2020   CREATININE 1.35 (H) 01/28/2020   BILITOT 0.4 01/28/2020   ALKPHOS 428 (H) 01/28/2020   AST 40 01/28/2020   ALT 51 (H) 01/28/2020    PROT 8.2 01/28/2020   ALBUMIN 4.4 01/28/2020   CALCIUM 10.0 01/28/2020   ANIONGAP 6 05/02/2017   GFR 54.13 (L) 09/07/2014   Lab Results  Component Value Date   CHOL 171 04/03/2019   Lab Results  Component Value Date   HDL 40 04/03/2019   Lab Results  Component Value Date   LDLCALC 106 (H) 04/03/2019   Lab Results  Component Value Date   TRIG 143 04/03/2019   Lab Results  Component Value Date   CHOLHDL 4.3 04/03/2019   Lab Results  Component Value Date   HGBA1C 5.1 10/02/2018      Assessment & Plan:   Problem List Items Addressed This Visit      Cardiovascular and Mediastinum   Essential hypertension, benign     Genitourinary   Recurrent UTI, h/o urethra diverticuli - Primary   Relevant Medications   nitrofurantoin, macrocrystal-monohydrate, (MACROBID) 100 MG capsule   Other Relevant Orders   Urine Culture      Meds ordered this encounter  Medications  . nitrofurantoin, macrocrystal-monohydrate, (MACROBID) 100 MG capsule    Sig: Take 1 capsule (100 mg total) by mouth 2 (two) times daily for 7 days.    Dispense:  14 capsule    Refill:  0    Order Specific Question:   Supervising Provider    Answer:   WRIGHT, PATRICK E [[id number]]  1. Recurrent UTI, h/o urethra diverticuli UA was negative for leukocytes or nitrates, however due to patient's significant history of previous UTIs, will treat based on  clinical presentation.  Encouraged increase hydration, symptomatic treatment.  Red flags given for prompt reevaluation - Urine Culture - nitrofurantoin, macrocrystal-monohydrate, (MACROBID) 100 MG capsule; Take 1 capsule (100 mg total) by mouth 2 (two) times daily for 7 days.  Dispense: 14 capsule; Refill: 0  2.  Essential hypertension Encourage patient to check blood pressure at home, keep a written log and have available for all office visits.  Patient encouraged to follow-up with primary care provider or return to mobile unit if blood pressure readings remain  elevated.   I have reviewed the patient's medical history (PMH, PSH, Social History, Family History, Medications, and allergies) , and have been updated if relevant. I spent 21 minutes reviewing chart and  face to face time with patient.      Follow-up: Return if symptoms worsen or fail to improve.    Loraine Grip Mayers, PA-C

## 2020-08-04 NOTE — Patient Instructions (Signed)
You will take Macrobid twice a day for the next 7 days.  I encourage you to increase your hydration.  I encourage you to check your blood pressure at home, keep a written log and have available for all office visits.  If your blood pressure readings are not within normal limits of 130/80 or slightly below, please follow-up with your primary care provider or feel free to return to the mobile unit.  I hope that you feel better soon, please let us know if there is anything else we can do for you  Kennieth Rad, PA-C Physician Assistant [facility] [email or web address]    Urinary Tract Infection, Adult  A urinary tract infection (UTI) is an infection of any part of the urinary tract. The urinary tract includes the kidneys, ureters, bladder, and urethra. These organs make, store, and get rid of urine in the body. An upper UTI affects the ureters and kidneys. A lower UTI affects the bladder and urethra. What are the causes? Most urinary tract infections are caused by bacteria in your genital area around your urethra, where urine leaves your body. These bacteria grow and cause inflammation of your urinary tract. What increases the risk? You are more likely to develop this condition if:  You have a urinary catheter that stays in place.  You are not able to control when you urinate or have a bowel movement (incontinence).  You are female and you: ? Use a spermicide or diaphragm for birth control. ? Have low estrogen levels. ? Are pregnant.  You have certain genes that increase your risk.  You are sexually active.  You take antibiotic medicines.  You have a condition that causes your flow of urine to slow down, such as: ? An enlarged prostate, if you are female. ? Blockage in your urethra. ? A kidney stone. ? A nerve condition that affects your bladder control (neurogenic bladder). ? Not getting enough to drink, or not urinating  often.  You have certain medical conditions, such as: ? Diabetes. ? A weak disease-fighting system (immunesystem). ? Sickle cell disease. ? Gout. ? Spinal cord injury. What are the signs or symptoms? Symptoms of this condition include:  Needing to urinate right away (urgency).  Frequent urination. This may include small amounts of urine each time you urinate.  Pain or burning with urination.  Blood in the urine.  Urine that smells bad or unusual.  Trouble urinating.  Cloudy urine.  Vaginal discharge, if you are female.  Pain in the abdomen or the lower back. You may also have:  Vomiting or a decreased appetite.  Confusion.  Irritability or tiredness.  A fever or chills.  Diarrhea. The first symptom in older adults may be confusion. In some cases, they may not have any symptoms until the infection has worsened. How is this diagnosed? This condition is diagnosed based on your medical history and a physical exam. You may also have other tests, including:  Urine tests.  Blood tests.  Tests for STIs (sexually transmitted infections). If you have had more than one UTI, a cystoscopy or imaging studies may be done to determine the cause of the infections. How is this treated? Treatment for this condition includes:  Antibiotic medicine.  Over-the-counter medicines to treat discomfort.  Drinking enough water to stay hydrated. If you have frequent infections or have other conditions such as a kidney stone, you may need to see a health care provider who specializes in the urinary tract (urologist). In rare  cases, urinary tract infections can cause sepsis. Sepsis is a life-threatening condition that occurs when the body responds to an infection. Sepsis is treated in the hospital with IV antibiotics, fluids, and other medicines. Follow these instructions at home: Medicines  Take over-the-counter and prescription medicines only as told by your health care  provider.  If you were prescribed an antibiotic medicine, take it as told by your health care provider. Do not stop using the antibiotic even if you start to feel better. General instructions  Make sure you: ? Empty your bladder often and completely. Do not hold urine for long periods of time. ? Empty your bladder after sex. ? Wipe from front to back after urinating or having a bowel movement if you are female. Use each tissue only one time when you wipe.  Drink enough fluid to keep your urine pale yellow.  Keep all follow-up visits. This is important.   Contact a health care provider if:  Your symptoms do not get better after 1-2 days.  Your symptoms go away and then return. Get help right away if:  You have severe pain in your back or your lower abdomen.  You have a fever or chills.  You have nausea or vomiting. Summary  A urinary tract infection (UTI) is an infection of any part of the urinary tract, which includes the kidneys, ureters, bladder, and urethra.  Most urinary tract infections are caused by bacteria in your genital area.  Treatment for this condition often includes antibiotic medicines.  If you were prescribed an antibiotic medicine, take it as told by your health care provider. Do not stop using the antibiotic even if you start to feel better.  Keep all follow-up visits. This is important. This information is not intended to replace advice given to you by your health care provider. Make sure you discuss any questions you have with your health care provider. Document Revised: 11/21/2019 Document Reviewed: 11/21/2019 Elsevier Patient Education  [employer].

## 2020-08-04 NOTE — Progress Notes (Signed)
Patient complains of dysuria since yesterday. Patient reports only drinking 2 bottles Patient takes medication at night.

## 2020-08-07 LAB — URINE CULTURE

## 2020-08-10 ENCOUNTER — Telehealth: Payer: Self-pay | Admitting: *Deleted

## 2020-08-10 NOTE — Telephone Encounter (Signed)
-----   Message from Kennieth Rad, [place] sent at 08/09/2020 11:47 AM EDT ----- Please call patient and let her know that her urine culture did not show any growth, her current treatment should be sufficient.

## 2020-08-10 NOTE — Telephone Encounter (Signed)
Patient verified DOB Patient is aware of urine culture showing no infection and to continue with current treatment.

## 2020-08-14 ENCOUNTER — Other Ambulatory Visit: Payer: Self-pay | Admitting: Internal Medicine

## 2020-08-14 DIAGNOSIS — I10 Essential (primary) hypertension: Secondary | ICD-10-CM

## 2020-08-16 ENCOUNTER — Other Ambulatory Visit: Payer: Self-pay

## 2020-08-16 ENCOUNTER — Encounter: Payer: Self-pay | Admitting: Nurse Practitioner

## 2020-08-16 ENCOUNTER — Ambulatory Visit (INDEPENDENT_AMBULATORY_CARE_PROVIDER_SITE_OTHER): Payer: BC Managed Care – PPO | Admitting: Nurse Practitioner

## 2020-08-16 DIAGNOSIS — I10 Essential (primary) hypertension: Secondary | ICD-10-CM

## 2020-08-16 MED ORDER — AMLODIPINE BESYLATE 10 MG PO TABS: 10.0000 mg | ORAL_TABLET | Freq: Every day | ORAL | 3 refills | Status: DC

## 2020-08-16 MED ORDER — LISINOPRIL-HYDROCHLOROTHIAZIDE 20-25 MG PO TABS: 1.0000 | ORAL_TABLET | Freq: Every day | ORAL | 3 refills | Status: DC

## 2020-08-16 NOTE — Progress Notes (Signed)
Subjective:    Patient here for follow-up of elevated blood pressure.Patient of Dr. Juleen China last seen by her in October 2021.  She is not exercising and is adherent to a low-salt diet.  Blood pressure is well controlled at home. Cardiac symptoms: none. Patient denies: chest pain, dyspnea, irregular heart beat and lower extremity edema. Cardiovascular risk factors: dyslipidemia, hypertension and obesity (BMI >= 30 kg/m2).  History of target organ damage: chronic kidney disease. Patient states that she ran out of her hypertension mediations yesterday and needs refills. Discussed that she will need a follow up with Dr. Juleen China in 3 months with lab work.  Note: Patient has not followed on lung nodules that were discussed during her last visit with Dr. Juleen China.  She was unable to get CT scan complete due to financial concerns.  Hopefully patient will be able to meet deductible at her next visit with Dr. Juleen China to be able to complete CT scan.    Patient also states that she has not followed with nephrology since her last visit with Dr. Juleen China.  She was advised to call her nephrology office to schedule a follow-up appointment.    Review of Systems Review of Systems  Constitutional: Negative for fever and malaise/fatigue.  Respiratory: Negative for cough.   Cardiovascular: Negative for chest pain, palpitations and leg swelling.  Gastrointestinal: Negative.   Genitourinary: Negative.   Psychiatric/Behavioral: Negative.        Objective:     Physical Exam Constitutional:      General: She is not in acute distress.    Appearance: Normal appearance. She is not ill-appearing.  Cardiovascular:     Rate and Rhythm: Normal rate and regular rhythm.     Pulses: Normal pulses.     Heart sounds: Normal heart sounds.  Musculoskeletal:     Right lower leg: No edema.     Left lower leg: No edema.  Neurological:     Mental Status: She is alert.  Psychiatric:        Mood and Affect: Mood normal.         Behavior: Behavior normal.       Assessment:    Hypertension, normal blood pressure in office today. Evidence of target organ damage: chronic kidney disease.    Plan:    Medication: no change. Dietary sodium restriction. Regular aerobic exercise. Check blood pressures weekly and record. Follow up: 3 months and as needed.

## 2020-08-16 NOTE — Patient Instructions (Addendum)
Essential hypertension, benign Blood Pressure stable in office today. Patient asymptomatic. Continue current medication regimen.  Counseled on blood pressure goal of less than 130/80 Advised to follow up with nephrology  Follow up:  Follow up with Dr. Juleen China in 3 months  Hypertension, Adult Hypertension is another name for high blood pressure. High blood pressure forces your heart to work harder to pump blood. This can cause problems over time. There are two numbers in a blood pressure reading. There is a top number (systolic) over a bottom number (diastolic). It is best to have a blood pressure that is below 120/80. Healthy choices can help lower your blood pressure, or you may need medicine to help lower it. What are the causes? The cause of this condition is not known. Some conditions may be related to high blood pressure. What increases the risk?  Smoking.  Having type 2 diabetes mellitus, high cholesterol, or both.  Not getting enough exercise or physical activity.  Being overweight.  Having too much fat, sugar, calories, or salt (sodium) in your diet.  Drinking too much alcohol.  Having long-term (chronic) kidney disease.  Having a family history of high blood pressure.  Age. Risk increases with age.  Race. You may be at higher risk if you are African American.  Gender. Men are at higher risk than women before age 55. After age 45, women are at higher risk than men.  Having obstructive sleep apnea.  Stress. What are the signs or symptoms?  High blood pressure may not cause symptoms. Very high blood pressure (hypertensive crisis) may cause: ? Headache. ? Feelings of worry or nervousness (anxiety). ? Shortness of breath. ? Nosebleed. ? A feeling of being sick to your stomach (nausea). ? Throwing up (vomiting). ? Changes in how you see. ? Very bad chest pain. ? Seizures. How is this treated?  This condition is treated by making healthy lifestyle changes,  such as: ? Eating healthy foods. ? Exercising more. ? Drinking less alcohol.  Your health care provider may prescribe medicine if lifestyle changes are not enough to get your blood pressure under control, and if: ? Your top number is above 130. ? Your bottom number is above 80.  Your personal target blood pressure may vary. Follow these instructions at home: Eating and drinking  If told, follow the DASH eating plan. To follow this plan: ? Fill one half of your plate at each meal with fruits and vegetables. ? Fill one fourth of your plate at each meal with whole grains. Whole grains include whole-wheat pasta, brown rice, and whole-grain bread. ? Eat or drink low-fat dairy products, such as skim milk or low-fat yogurt. ? Fill one fourth of your plate at each meal with low-fat (lean) proteins. Low-fat proteins include fish, chicken without skin, eggs, beans, and tofu. ? Avoid fatty meat, cured and processed meat, or chicken with skin. ? Avoid pre-made or processed food.  Eat less than 1,500 mg of salt each day.  Do not drink alcohol if: ? Your doctor tells you not to drink. ? You are pregnant, may be pregnant, or are planning to become pregnant.  If you drink alcohol: ? Limit how much you use to:  0-1 drink a day for women.  0-2 drinks a day for men. ? Be aware of how much alcohol is in your drink. In the [place] one drink equals one 12 oz bottle of beer (355 mL), one 5 oz glass of wine (148 mL), or one 1  oz glass of hard liquor (44 mL).   Lifestyle  Work with your doctor to stay at a healthy weight or to lose weight. Ask your doctor what the best weight is for you.  Get at least 30 minutes of exercise most days of the week. This may include walking, swimming, or biking.  Get at least 30 minutes of exercise that strengthens your muscles (resistance exercise) at least 3 days a week. This may include lifting weights or doing Pilates.  Do not use any products that contain nicotine  or tobacco, such as cigarettes, e-cigarettes, and chewing tobacco. If you need help quitting, ask your doctor.  Check your blood pressure at home as told by your doctor.  Keep all follow-up visits as told by your doctor. This is important.   Medicines  Take over-the-counter and prescription medicines only as told by your doctor. Follow directions carefully.  Do not skip doses of blood pressure medicine. The medicine does not work as well if you skip doses. Skipping doses also puts you at risk for problems.  Ask your doctor about side effects or reactions to medicines that you should watch for. Contact a doctor if you:  Think you are having a reaction to the medicine you are taking.  Have headaches that keep coming back (recurring).  Feel dizzy.  Have swelling in your ankles.  Have trouble with your vision. Get help right away if you:  Get a very bad headache.  Start to feel mixed up (confused).  Feel weak or numb.  Feel faint.  Have very bad pain in your: ? Chest. ? Belly (abdomen).  Throw up more than once.  Have trouble breathing. Summary  Hypertension is another name for high blood pressure.  High blood pressure forces your heart to work harder to pump blood.  For most people, a normal blood pressure is less than 120/80.  Making healthy choices can help lower blood pressure. If your blood pressure does not get lower with healthy choices, you may need to take medicine. This information is not intended to replace advice given to you by your health care provider. Make sure you discuss any questions you have with your health care provider. Document Revised: 12/19/2017 Document Reviewed: 12/19/2017 Elsevier Patient Education  2021 [employer].

## 2020-08-28 ENCOUNTER — Other Ambulatory Visit: Payer: Self-pay | Admitting: Internal Medicine

## 2020-08-28 DIAGNOSIS — K219 Gastro-esophageal reflux disease without esophagitis: Secondary | ICD-10-CM

## 2020-10-08 ENCOUNTER — Ambulatory Visit
Admission: RE | Admit: 2020-10-08 | Discharge: 2020-10-08 | Disposition: A | Discharge status: Home / Self Care | Payer: BC Managed Care – PPO | Source: Ambulatory Visit | Attending: Internal Medicine | Admitting: Internal Medicine

## 2020-10-08 ENCOUNTER — Other Ambulatory Visit: Payer: Self-pay

## 2020-10-08 DIAGNOSIS — Z1231 Encounter for screening mammogram for malignant neoplasm of breast: Secondary | ICD-10-CM

## 2020-10-21 ENCOUNTER — Ambulatory Visit
Admission: EM | Admit: 2020-10-21 | Discharge: 2020-10-21 | Disposition: A | Discharge status: Home / Self Care | Payer: BC Managed Care – PPO | Attending: Emergency Medicine | Admitting: Emergency Medicine

## 2020-10-21 DIAGNOSIS — J019 Acute sinusitis, unspecified: Secondary | ICD-10-CM

## 2020-10-21 MED ORDER — AMOXICILLIN-POT CLAVULANATE 875-125 MG PO TABS: 1.0000 | ORAL_TABLET | Freq: Two times a day (BID) | ORAL | 0 refills | Status: AC

## 2020-10-21 MED ORDER — FLUTICASONE PROPIONATE 50 MCG/ACT NA SUSP: 1.0000 | Freq: Every day | NASAL | 0 refills | Status: DC

## 2020-10-21 MED ORDER — CETIRIZINE HCL 10 MG PO CAPS: 10.0000 mg | ORAL_CAPSULE | Freq: Every day | ORAL | 0 refills | Status: DC

## 2020-10-21 MED ORDER — BENZONATATE 200 MG PO CAPS: 200.0000 mg | ORAL_CAPSULE | Freq: Three times a day (TID) | ORAL | 0 refills | Status: AC | PRN

## 2020-10-21 NOTE — ED Triage Notes (Signed)
One week h/o nasal congestion, sore throat, productive cough and post nasal drip with three days of waxing and waning fatigue. No n/v/d. Denies HA. Pt reports that she usually takes Flonase and Zyrtec but hasn't due to needing a refill.

## 2020-10-21 NOTE — ED Provider Notes (Signed)
[facility]    CSN: [id number] Arrival date & time: 10/21/20  1237      History   Chief Complaint Chief Complaint  Patient presents with   Nasal Congestion   Sore Throat    HPI Kristin Burnett is a 55 y.o. female history of hypertension, CKD, presenting today for evaluation of URI symptoms.  Reports cough congestion sore throat and drainage for approximately 1 week.  Symptoms did improve, but then worsened over the past 48 hours.  Reports a lot of sinus congestion pressure, ear pressure and throat discomfort.  Denies symptoms significantly in chest.  Denies chest pain or shortness of breath.  Denies any fevers.  HPI  Past Medical History:  Diagnosis Date   Bronchospasm 01/09/2013   DUB (dysfunctional uterine bleeding)    GERD (gastroesophageal reflux disease)    otc tums   History of blood transfusion 2011   "related to fibrods"   Hypertension    Pneumonia ~ 2015; 04/29/2017   Recurrent UTI, h/o urethra diverticuli 10/13/2009   Qualifier: Diagnosis of  By: Stanford Scotland MD, [name]     Urethral diverticulum    Dr. Estill Dooms   Yeast vaginitis    Dr. Estill Dooms    Patient Active Problem List   Diagnosis Date Noted   Hyperlipidemia 07/03/2019   CKD (chronic kidney disease) 07/03/2019   Tachypnea 04/29/2017   Urethra, diverticulum 07/06/2014   FOM (frequency of micturition) 07/06/2014   Phlebitis of arm 10/20/2013   Anxiety and depression 03/05/2013   Bronchospasm 01/09/2013   Annual physical exam 09/19/2012   Edema of   legs, L>R 09/19/2012   GERD (gastroesophageal reflux disease) 09/19/2012   LEIOMYOMA OF UTERUS, UNSPECIFIED 10/13/2009   ANEMIA, HYPOCHROMIC 10/13/2009   Essential hypertension, benign 10/13/2009   Recurrent UTI, h/o urethra diverticuli 10/13/2009   DYSFUNCTIONAL UTERINE BLEEDING 10/13/2009    Past Surgical History:  Procedure Laterality Date   BILATERAL SALPINGECTOMY  03/07/2012   Procedure: BILATERAL SALPINGECTOMY;  Surgeon: Lavonia Drafts, MD;  Location: [facility];  Service: Gynecology;  Laterality: Bilateral;   CYSTOSCOPY  03/07/2012   Procedure: CYSTOSCOPY;  Surgeon: Lavonia Drafts, MD;  Location: [facility];  Service: Gynecology;  Laterality: N/A;   LASER ABLATION     "before they took my uterus out"   ROBOTIC ASSISTED TOTAL HYSTERECTOMY  03/07/2012   Procedure: ROBOTIC ASSISTED TOTAL HYSTERECTOMY;  Surgeon: Lavonia Drafts, MD;  Location: [facility];  Service: Gynecology;  Laterality: N/A;   TUBAL LIGATION     URETHRAL DIVERTICULECTOMY     VIDEO BRONCHOSCOPY Bilateral 05/01/2017   Procedure: VIDEO BRONCHOSCOPY WITH FLUORO;  Surgeon: Marshell Garfinkel, MD;  Location: [facility];  Service: Cardiopulmonary;  Laterality: Bilateral;    OB History     Gravida  3   Para  3   Term  3   Preterm      AB      Living  3      SAB      IAB      Ectopic      Multiple      Live Births               Home Medications    Prior to Admission medications   Medication Sig Start Date End Date Taking? Authorizing Provider  amoxicillin-clavulanate (AUGMENTIN) 875-125 MG tablet Take 1 tablet by mouth every 12 (twelve) hours for 7 days. 10/21/20 10/28/20 Yes Jaylissa Felty C, PA-C  benzonatate (TESSALON) 200 MG capsule Take 1 capsule (  200 mg total) by mouth 3 (three) times daily as needed for up to 7 days for cough. 10/21/20 10/28/20 Yes Suri Tafolla C, PA-C  Cetirizine HCl 10 MG CAPS Take 1 capsule (10 mg total) by mouth daily. 10/21/20  Yes Majesty Oehlert C, PA-C  fluticasone (FLONASE) 50 MCG/ACT nasal spray Place 1-2 sprays into both nostrils daily. 10/21/20  Yes Todd Jelinski C, PA-C  amLODipine (NORVASC) 10 MG tablet Take 1 tablet (10 mg total) by mouth daily. 08/16/20   Fenton Foy, NP  atorvastatin (LIPITOR) 20 MG tablet Take 1 tablet (20 mg total) by mouth daily. 04/03/19   Charlott Rakes, MD  lisinopril-hydrochlorothiazide (ZESTORETIC) 20-25 MG tablet Take 1 tablet by mouth daily. 08/16/20    Fenton Foy, NP  omeprazole (PRILOSEC) 40 MG capsule Take 1 capsule by mouth once daily 09/02/20   Nicolette Bang, MD  varenicline (CHANTIX CONTINUING MONTH PAK) 1 MG tablet Take 1 tablet (1 mg total) by mouth 2 (two) times daily. 03/03/20   Mayers, Loraine Grip, PA-C    Family History Family History  Problem Relation Age of Onset   Hypertension Mother    Arthritis Mother    Anemia Mother    Cancer Mother        uterine?   Colon cancer Mother 52   Diabetes Father    Stroke Father 81   Heart disease Father        has a defib   Hypertension Father    Heart failure Father    Heart disease Brother    Heart failure Brother    Leukemia Maternal Grandmother    Breast cancer Neg Hx    Esophageal cancer Neg Hx    Stomach cancer Neg Hx    Rectal cancer Neg Hx    Colon polyps Neg Hx     Social History Social History   Tobacco Use   Smoking status: Some Days    Packs/day: 1.00    Years: 36.00    Pack years: 36.00    Types: Cigarettes    Start date: 06/21/1986   Smokeless tobacco: Never   Tobacco comments:    1 pack/day  Vaping Use   Vaping Use: Never used  Substance Use Topics   Alcohol use: No    Alcohol/week: 0.0 standard drinks   Drug use: No     Allergies   Patient has no known allergies.   Review of Systems Review of Systems  Constitutional:  Negative for activity change, appetite change, chills, fatigue and fever.  HENT:  Positive for congestion, rhinorrhea, sinus pressure and sore throat. Negative for ear pain and trouble swallowing.   Eyes:  Negative for discharge and redness.  Respiratory:  Positive for cough. Negative for chest tightness and shortness of breath.   Cardiovascular:  Negative for chest pain.  Gastrointestinal:  Negative for abdominal pain, diarrhea, nausea and vomiting.  Musculoskeletal:  Negative for myalgias.  Skin:  Negative for rash.  Neurological:  Negative for dizziness, light-headedness and headaches.    Physical  Exam Triage Vital Signs ED Triage Vitals  Enc Vitals Group     BP 10/21/20 1305 (!) 136/91     Pulse Rate 10/21/20 1305 (!) 104     Resp 10/21/20 1305 18     Temp 10/21/20 1305 98.5 F (36.9 C)     Temp Source 10/21/20 1305 Oral     SpO2 10/21/20 1305 95 %     Weight --  Height --      Head Circumference --      Peak Flow --      Pain Score 10/21/20 1307 1     Pain Loc --      Pain Edu? --      Excl. in [place]? --    No data found.  Updated Vital Signs BP (!) 136/91 (BP Location: Right Arm)   Pulse (!) 104   Temp 98.5 F (36.9 C) (Oral)   Resp 18   LMP 02/29/2012 (Exact Date)   SpO2 95%   Visual Acuity Right Eye Distance:   Left Eye Distance:   Bilateral Distance:    Right Eye Near:   Left Eye Near:    Bilateral Near:     Physical Exam Vitals and nursing note reviewed.  Constitutional:      Appearance: Kristin Burnett is well-developed.     Comments: No acute distress  HENT:     Head: Normocephalic and atraumatic.     Ears:     Comments: Bilateral ears without tenderness to palpation of external auricle, tragus and mastoid, EAC's without erythema or swelling, TM's with good bony landmarks and cone of light. Non erythematous.      Nose: Nose normal.     Mouth/Throat:     Comments: Oral mucosa pink and moist, no tonsillar enlargement or exudate. Posterior pharynx patent and nonerythematous, no uvula deviation or swelling. Normal phonation.  Eyes:     Conjunctiva/sclera: Conjunctivae normal.  Cardiovascular:     Rate and Rhythm: Normal rate and regular rhythm.  Pulmonary:     Effort: Pulmonary effort is normal. No respiratory distress.     Comments: Breathing comfortably at rest, CTABL, no wheezing, rales or other adventitious sounds auscultated  Abdominal:     General: There is no distension.  Musculoskeletal:        General: Normal range of motion.     Cervical back: Neck supple.  Skin:    General: Skin is warm and dry.  Neurological:     Mental Status: Kristin Burnett  is alert and oriented to person, place, and time.     UC Treatments / Results  Labs (all labs ordered are listed, but only abnormal results are displayed) Labs Reviewed - No data to display  EKG   Radiology No results found.  Procedures Procedures (including critical care time)  Medications Ordered in UC Medications - No data to display  Initial Impression / Assessment and Plan / UC Course  I have reviewed the triage vital signs and the nursing notes.  Pertinent labs & imaging results that were available during my care of the patient were reviewed by me and considered in my medical decision making (see chart for details).     URI symptoms x1 week, recent double sickening, covering for sinusitis with Augmentin, refilling Zyrtec and Flonase, continue Mucinex, Tessalon as needed for cough, lungs clear to auscultation, vital signs stable.  Continue to monitor for resolution of symptoms.  Discussed strict return precautions. Patient verbalized understanding and is agreeable with plan.  Final Clinical Impressions(s) / UC Diagnoses   Final diagnoses:  Acute sinusitis with symptoms > 10 days     Discharge Instructions      Augmentin twice daily for 1 week Restart daily cetirizine and Flonase for congestion and drainage Continue Mucinex Tessalon every 8 hours for cough Rest and fluids Follow-up if not improving or worsening     ED Prescriptions     Medication Sig Dispense  Auth. Provider   amoxicillin-clavulanate (AUGMENTIN) 875-125 MG tablet Take 1 tablet by mouth every 12 (twelve) hours for 7 days. 14 tablet Jerimie Mancuso C, PA-C   fluticasone (FLONASE) 50 MCG/ACT nasal spray Place 1-2 sprays into both nostrils daily. 16 g Leonia Heatherly C, PA-C   Cetirizine HCl 10 MG CAPS Take 1 capsule (10 mg total) by mouth daily. 30 capsule Kyree Adriano C, PA-C   benzonatate (TESSALON) 200 MG capsule Take 1 capsule (200 mg total) by mouth 3 (three) times daily as needed for  up to 7 days for cough. 28 capsule Harve Spradley, [place] C, PA-C      PDMP not reviewed this encounter.   Janith Lima, [place] 10/21/20 1338

## 2020-10-21 NOTE — Discharge Instructions (Addendum)
Augmentin twice daily for 1 week Restart daily cetirizine and Flonase for congestion and drainage Continue Mucinex Tessalon every 8 hours for cough Rest and fluids Follow-up if not improving or worsening

## 2020-11-13 NOTE — Progress Notes (Signed)
Patient ID: RAKIRA BENTHAM, female    DOB: 1965/08/08  MRN: [id number]  CC: Hypertension Follow-Up  Subjective: Audray Dargenio is a 55 y.o. female who presents for hypertension follow-up.   Her concerns today include:   HYPERTENSION FOLLOW-UP: 08/16/2020 per NP note:  Medication: no change. Dietary sodium restriction. Regular aerobic exercise. Check blood pressures weekly and record. Follow up: 3 months and as needed.  11/15/2020: Currently taking: see medication list Med Adherence: '[x]'$  Yes    '[]'$  No Medication side effects: '[]'$  Yes    '[x]'$  No Adherence with salt restriction (low-salt diet): '[x]'$  Yes    '[]'$  No Exercise: Yes '[]'$  No '[x]'$  SOB? '[]'$  Yes    '[x]'$  No Chest Pain?: '[]'$  Yes    '[x]'$  No  2. ACID REFLUX: Omeprazole no longer covered by health insurance, would like to try new medication.   Patient Active Problem List   Diagnosis Date Noted   Hyperlipidemia 07/03/2019   CKD (chronic kidney disease) 07/03/2019   Tachypnea 04/29/2017   Urethra, diverticulum 07/06/2014   FOM (frequency of micturition) 07/06/2014   Phlebitis of arm 10/20/2013   Anxiety and depression 03/05/2013   Bronchospasm 01/09/2013   Annual physical exam 09/19/2012   Edema of   legs, L>R 09/19/2012   GERD (gastroesophageal reflux disease) 09/19/2012   LEIOMYOMA OF UTERUS, UNSPECIFIED 10/13/2009   ANEMIA, HYPOCHROMIC 10/13/2009   Essential hypertension, benign 10/13/2009   Recurrent UTI, h/o urethra diverticuli 10/13/2009   DYSFUNCTIONAL UTERINE BLEEDING 10/13/2009     Current Outpatient Medications on File Prior to Visit  Medication Sig Dispense Refill   atorvastatin (LIPITOR) 20 MG tablet Take 1 tablet (20 mg total) by mouth daily. 90 tablet 3   Cetirizine HCl 10 MG CAPS Take 1 capsule (10 mg total) by mouth daily. 30 capsule 0   fluticasone (FLONASE) 50 MCG/ACT nasal spray Place 1-2 sprays into both nostrils daily. 16 g 0   varenicline (CHANTIX CONTINUING MONTH PAK) 1 MG tablet Take 1 tablet (1 mg  total) by mouth 2 (two) times daily. 60 tablet 2   No current facility-administered medications on file prior to visit.    No Known Allergies  Social History   Socioeconomic History   Marital status: Divorced    Spouse name: Not on file   Number of children: 3   Years of education: Not on file   Highest education level: Not on file  Occupational History   Occupation: bus driver    Employer: [employer]  Tobacco Use   Smoking status: Some Days    Packs/day: 1.00    Years: 36.00    Pack years: 36.00    Types: Cigarettes    Start date: 06/21/1986   Smokeless tobacco: Never   Tobacco comments:    1 pack/day  Vaping Use   Vaping Use: Never used  Substance and Sexual Activity   Alcohol use: No    Alcohol/week: 0.0 standard drinks   Drug use: No   Sexual activity: Not Currently    Birth control/protection: Surgical  Other Topics Concern   Not on file  Social History Narrative   Household: pt and 2 children   Regular exercise: was; not been in 1 mth   Caffeine use: coffee daily   Social Determinants of Health   Financial Resource Strain: Not on file  Food Insecurity: Not on file  Transportation Needs: Not on file  Physical Activity: Not on file  Stress: Not on file  Social Connections: Not on  file  Intimate Partner Violence: Not on file    Family History  Problem Relation Age of Onset   Hypertension Mother    Arthritis Mother    Anemia Mother    Cancer Mother        uterine?   Colon cancer Mother 81   Diabetes Father    Stroke Father 14   Heart disease Father        has a defib   Hypertension Father    Heart failure Father    Heart disease Brother    Heart failure Brother    Leukemia Maternal Grandmother    Breast cancer Neg Hx    Esophageal cancer Neg Hx    Stomach cancer Neg Hx    Rectal cancer Neg Hx    Colon polyps Neg Hx     Past Surgical History:  Procedure Laterality Date   BILATERAL SALPINGECTOMY  03/07/2012   Procedure:  BILATERAL SALPINGECTOMY;  Surgeon: Lavonia Drafts, MD;  Location: [facility];  Service: Gynecology;  Laterality: Bilateral;   CYSTOSCOPY  03/07/2012   Procedure: CYSTOSCOPY;  Surgeon: Lavonia Drafts, MD;  Location: [facility];  Service: Gynecology;  Laterality: N/A;   LASER ABLATION     "before they took my uterus out"   ROBOTIC ASSISTED TOTAL HYSTERECTOMY  03/07/2012   Procedure: ROBOTIC ASSISTED TOTAL HYSTERECTOMY;  Surgeon: Lavonia Drafts, MD;  Location: [facility];  Service: Gynecology;  Laterality: N/A;   TUBAL LIGATION     URETHRAL DIVERTICULECTOMY     VIDEO BRONCHOSCOPY Bilateral 05/01/2017   Procedure: VIDEO BRONCHOSCOPY WITH FLUORO;  Surgeon: Marshell Garfinkel, MD;  Location: [facility];  Service: Cardiopulmonary;  Laterality: Bilateral;    ROS: Review of Systems Negative except as stated above  PHYSICAL EXAM: BP 111/80 (BP Location: Left Arm, Patient Position: Sitting, Cuff Size: Large)   Pulse 90   Temp 98.1 F (36.7 C)   Resp 16   Ht 5' 7.01" (1.702 m)   Wt 234 lb 9.6 oz (106.4 kg)   LMP 02/29/2012 (Exact Date)   SpO2 97%   BMI 36.73 kg/m   Physical Exam HENT:     Head: Normocephalic and atraumatic.  Eyes:     Extraocular Movements: Extraocular movements intact.     Conjunctiva/sclera: Conjunctivae normal.     Pupils: Pupils are equal, round, and reactive to light.  Cardiovascular:     Rate and Rhythm: Normal rate and regular rhythm.     Pulses: Normal pulses.     Heart sounds: Normal heart sounds.  Pulmonary:     Effort: Pulmonary effort is normal.     Breath sounds: Normal breath sounds.  Musculoskeletal:     Cervical back: Normal range of motion and neck supple.  Neurological:     General: No focal deficit present.     Mental Status: She is alert and oriented to person, place, and time.  Psychiatric:        Mood and Affect: Mood normal.        Behavior: Behavior normal.   ASSESSMENT AND PLAN: 1. Essential hypertension: - Continue  Lisinopril-Hydrochlorothiazide and Amlodipine as prescribed. - Counseled on blood pressure goal of less than 130/80, low-sodium, DASH diet, medication compliance, 150 minutes of moderate intensity exercise per week as tolerated. Discussed medication compliance, adverse effects. - BMP to evaluate kidney function and electrolyte balance. - Follow-up with primary provider in 3 months or sooner if needed.  - Basic Metabolic Panel - lisinopril-hydrochlorothiazide (ZESTORETIC) 20-25 MG tablet; Take 1 tablet  by mouth daily.  Dispense: 90 tablet; Refill: 0 - amLODipine (NORVASC) 10 MG tablet; Take 1 tablet (10 mg total) by mouth daily.  Dispense: 90 tablet; Refill: 0  2. Gastroesophageal reflux disease without esophagitis: - Per patient request Omeprazole discontinued related to health insurance purposes.  - Begin Pantoprazole as prescribed.  - Follow-up with primary provider as scheduled.  - pantoprazole (PROTONIX) 40 MG tablet; Take 1 tablet (40 mg total) by mouth daily.  Dispense: 90 tablet; Refill: 0    Patient was given the opportunity to ask questions.  Patient verbalized understanding of the plan and was able to repeat key elements of the plan. Patient was given clear instructions to go to Emergency Department or return to medical center if symptoms don't improve, worsen, or new problems develop.The patient verbalized understanding.   Orders Placed This Encounter  Procedures   Basic Metabolic Panel    Requested Prescriptions   Signed Prescriptions Disp Refills   pantoprazole (PROTONIX) 40 MG tablet 90 tablet 0    Sig: Take 1 tablet (40 mg total) by mouth daily.   lisinopril-hydrochlorothiazide (ZESTORETIC) 20-25 MG tablet 90 tablet 0    Sig: Take 1 tablet by mouth daily.   amLODipine (NORVASC) 10 MG tablet 90 tablet 0    Sig: Take 1 tablet (10 mg total) by mouth daily.    Return in about 3 months (around 02/15/2021) for Follow-Up hypertension .  Camillia Herter, NP

## 2020-11-15 ENCOUNTER — Other Ambulatory Visit: Payer: Self-pay

## 2020-11-15 ENCOUNTER — Encounter: Payer: Self-pay | Admitting: Family

## 2020-11-15 ENCOUNTER — Ambulatory Visit (INDEPENDENT_AMBULATORY_CARE_PROVIDER_SITE_OTHER): Payer: BC Managed Care – PPO | Admitting: Family

## 2020-11-15 VITALS — BP 111/80 | HR 90 | Temp 98.1°F | Resp 16 | Ht 67.01 in | Wt 234.6 lb

## 2020-11-15 DIAGNOSIS — K219 Gastro-esophageal reflux disease without esophagitis: Secondary | ICD-10-CM

## 2020-11-15 DIAGNOSIS — I10 Essential (primary) hypertension: Secondary | ICD-10-CM

## 2020-11-15 MED ORDER — AMLODIPINE BESYLATE 10 MG PO TABS: 10.0000 mg | ORAL_TABLET | Freq: Every day | ORAL | 0 refills | Status: DC

## 2020-11-15 MED ORDER — LISINOPRIL-HYDROCHLOROTHIAZIDE 20-25 MG PO TABS: 1.0000 | ORAL_TABLET | Freq: Every day | ORAL | 0 refills | Status: DC

## 2020-11-15 MED ORDER — PANTOPRAZOLE SODIUM 40 MG PO TBEC: 40.0000 mg | DELAYED_RELEASE_TABLET | Freq: Every day | ORAL | 0 refills | Status: DC

## 2020-11-15 NOTE — Progress Notes (Signed)
Pt presents for hypertension follow-up, wants to change from Omeprazole and try something else med not as effective and not covered by her insurance

## 2020-11-16 ENCOUNTER — Other Ambulatory Visit: Payer: Self-pay | Admitting: Family

## 2020-11-16 DIAGNOSIS — N1832 Chronic kidney disease, stage 3b: Secondary | ICD-10-CM

## 2020-11-16 LAB — BASIC METABOLIC PANEL
BUN/Creatinine Ratio: 15 (ref 9–23)
BUN: 22 mg/dL (ref 6–24)
CO2: 23 mmol/L (ref 20–29)
Calcium: 9.4 mg/dL (ref 8.7–10.2)
Chloride: 99 mmol/L (ref 96–106)
Creatinine, Ser: 1.43 mg/dL — ABNORMAL HIGH (ref 0.57–1.00)
Glucose: 97 mg/dL (ref 65–99)
Potassium: 4.4 mmol/L (ref 3.5–5.2)
Sodium: 136 mmol/L (ref 134–144)
eGFR: 44 mL/min/{1.73_m2} — ABNORMAL LOW (ref >59)

## 2020-11-16 NOTE — Progress Notes (Signed)
Chronic kidney disease worsened since last visit. Referral to Nephrology for further evaluation and management. Their office should call patient within 2 weeks with appointment details.

## 2021-03-10 ENCOUNTER — Ambulatory Visit (INDEPENDENT_AMBULATORY_CARE_PROVIDER_SITE_OTHER): Payer: BC Managed Care – PPO | Admitting: Nurse Practitioner

## 2021-03-10 ENCOUNTER — Encounter: Payer: Self-pay | Admitting: Nurse Practitioner

## 2021-03-10 ENCOUNTER — Other Ambulatory Visit: Payer: Self-pay

## 2021-03-10 VITALS — BP 152/101 | HR 90 | Temp 99.2°F | Resp 18 | Ht 64.0 in | Wt 232.0 lb

## 2021-03-10 DIAGNOSIS — J069 Acute upper respiratory infection, unspecified: Secondary | ICD-10-CM | POA: Diagnosis not present

## 2021-03-10 DIAGNOSIS — R509 Fever, unspecified: Secondary | ICD-10-CM | POA: Insufficient documentation

## 2021-03-10 MED ORDER — FLUTICASONE PROPIONATE 50 MCG/ACT NA SUSP: 2.0000 | Freq: Every day | NASAL | 0 refills | Status: DC

## 2021-03-10 MED ORDER — AMOXICILLIN-POT CLAVULANATE 875-125 MG PO TABS: 1.0000 | ORAL_TABLET | Freq: Two times a day (BID) | ORAL | 0 refills | Status: DC

## 2021-03-10 NOTE — Progress Notes (Signed)
@Patient  ID: Kristin Burnett, female    DOB: 24-Jun-1965, 55 y.o.   MRN: [id number]  Chief Complaint  Patient presents with   Sinus Problem    Referring provider: Camillia Herter, NP  HPI  Patient presents today for right ear pain.  She states that she has noticed some ear discomfort for the past week but over the past couple days she has developed right-sided sinus pressure and pain and has had low-grade fever.  She states that she has also noticed postnasal drip. Denies f/c/s, n/v/d, hemoptysis, PND, chest pain or edema.   No Known Allergies  Immunization History  Administered Date(s) Administered   Moderna Sars-Covid-2 Vaccination 09/27/2019, 10/27/2019   Pneumococcal Polysaccharide-23 07/02/2018   Tdap 09/19/2012    Past Medical History:  Diagnosis Date   Bronchospasm 01/09/2013   DUB (dysfunctional uterine bleeding)    GERD (gastroesophageal reflux disease)    otc tums   History of blood transfusion 2011   "related to fibrods"   Hypertension    Pneumonia ~ 2015; 04/29/2017   Recurrent UTI, h/o urethra diverticuli 10/13/2009   Qualifier: Diagnosis of  By: Stanford Scotland MD, [name]     Urethral diverticulum    Dr. Estill Dooms   Yeast vaginitis    Dr. Estill Dooms    Tobacco History: Social History   Tobacco Use  Smoking Status Some Days   Packs/day: 1.00   Years: 36.00   Pack years: 36.00   Types: Cigarettes   Start date: 06/21/1986  Smokeless Tobacco Never  Tobacco Comments   1 pack/day   Ready to quit: Not Answered Counseling given: Not Answered Tobacco comments: 1 pack/day   Outpatient Encounter Medications as of 03/10/2021  Medication Sig   amLODipine (NORVASC) 10 MG tablet Take 1 tablet (10 mg total) by mouth daily.   amoxicillin-clavulanate (AUGMENTIN) 875-125 MG tablet Take 1 tablet by mouth 2 (two) times daily.   atorvastatin (LIPITOR) 20 MG tablet Take 1 tablet (20 mg total) by mouth daily.   fluticasone (FLONASE) 50 MCG/ACT nasal spray Place 2 sprays into both  nostrils daily.   lisinopril-hydrochlorothiazide (ZESTORETIC) 20-25 MG tablet Take 1 tablet by mouth daily.   Cetirizine HCl 10 MG CAPS Take 1 capsule (10 mg total) by mouth daily. (Patient not taking: Reported on 03/10/2021)   fluticasone (FLONASE) 50 MCG/ACT nasal spray Place 1-2 sprays into both nostrils daily. (Patient not taking: Reported on 03/10/2021)   pantoprazole (PROTONIX) 40 MG tablet Take 1 tablet (40 mg total) by mouth daily. (Patient not taking: Reported on 03/10/2021)   varenicline (CHANTIX CONTINUING MONTH PAK) 1 MG tablet Take 1 tablet (1 mg total) by mouth 2 (two) times daily. (Patient not taking: Reported on 03/10/2021)   No facility-administered encounter medications on file as of 03/10/2021.     Review of Systems  Review of Systems  Constitutional:  Positive for fever.  HENT:  Positive for congestion, ear pain, postnasal drip, sinus pressure, sinus pain and sore throat. Negative for ear discharge.   Cardiovascular: Negative.   Gastrointestinal: Negative.   Allergic/Immunologic: Negative.   Neurological: Negative.   Psychiatric/Behavioral: Negative.        Physical Exam  BP (!) 152/101 (BP Location: Right Arm, Patient Position: Sitting, Cuff Size: Large)   Pulse 90   Temp 99.2 F (37.3 C) (Oral)   Resp 18   Ht 5\' 4"  (1.626 m)   Wt 232 lb (105.2 kg)   LMP 02/29/2012 (Exact Date)   SpO2 94%   BMI 39.82 kg/m  Wt Readings from Last 5 Encounters:  03/10/21 232 lb (105.2 kg)  11/15/20 234 lb 9.6 oz (106.4 kg)  08/16/20 251 lb 3.2 oz (113.9 kg)  08/04/20 250 lb (113.4 kg)  03/03/20 252 lb (114.3 kg)     Physical Exam Vitals and nursing note reviewed.  Constitutional:      General: She is not in acute distress.    Appearance: She is well-developed.  HENT:     Right Ear: Tympanic membrane is erythematous and bulging.  Cardiovascular:     Rate and Rhythm: Normal rate and regular rhythm.  Pulmonary:     Effort: Pulmonary effort is normal.      Breath sounds: Normal breath sounds.  Neurological:     Mental Status: She is alert and oriented to person, place, and time.      Assessment & Plan:   Upper respiratory tract infection Stay well hydrated  Stay active  Deep breathing exercises  May take tylenol or fever or pain  May take mucinex twice daily  Will order Augmentin  Will order Flonase     Follow up:  Follow up if needed     Fenton Foy, NP 03/10/2021

## 2021-03-10 NOTE — Patient Instructions (Addendum)
URI:   Stay well hydrated  Stay active  Deep breathing exercises  May take tylenol or fever or pain  May take mucinex twice daily  Will order Augmentin  Will order Flonase     Follow up:  Follow up if needed

## 2021-03-10 NOTE — Assessment & Plan Note (Signed)
Stay well hydrated  Stay active  Deep breathing exercises  May take tylenol or fever or pain  May take mucinex twice daily  Will order Augmentin  Will order Flonase     Follow up:  Follow up if needed

## 2021-03-10 NOTE — Progress Notes (Signed)
Patient takes medication at night, last taken around 7-8pm last night.  Patient has eaten today. Patient complains of R ear pressure for the past few weeks with increased sinus pressure the past 2 days. Patient used OTC Mucinex like cold medication with minimal relief. Patient states insurance no longer covers daily allergy medication therefore patient has not used them.

## 2021-03-11 LAB — COVID-19, FLU A+B AND RSV
Influenza A, NAA: NOT DETECTED
Influenza B, NAA: NOT DETECTED
RSV, NAA: NOT DETECTED
SARS-CoV-2, NAA: NOT DETECTED

## 2021-04-27 ENCOUNTER — Ambulatory Visit (INDEPENDENT_AMBULATORY_CARE_PROVIDER_SITE_OTHER): Payer: BC Managed Care – PPO | Admitting: Nurse Practitioner

## 2021-04-27 ENCOUNTER — Other Ambulatory Visit: Payer: Self-pay

## 2021-04-27 DIAGNOSIS — L299 Pruritus, unspecified: Secondary | ICD-10-CM | POA: Diagnosis not present

## 2021-04-27 MED ORDER — PREDNISONE 20 MG PO TABS: 20.0000 mg | ORAL_TABLET | Freq: Every day | ORAL | 0 refills | Status: AC

## 2021-04-27 MED ORDER — CLOTRIMAZOLE-BETAMETHASONE 1-0.05 % EX CREA: 1.0000 "application " | TOPICAL_CREAM | Freq: Every day | CUTANEOUS | 0 refills | Status: DC

## 2021-04-27 MED ORDER — HYDROXYZINE HCL 10 MG PO TABS: 10.0000 mg | ORAL_TABLET | Freq: Three times a day (TID) | ORAL | 0 refills | Status: DC | PRN

## 2021-04-27 NOTE — Progress Notes (Signed)
Virtual Visit via Telephone Note  I connected with Kristin Burnett on 04/28/21 at 10:40 AM EST by telephone and verified that I am speaking with the correct person using two identifiers.  Location: Patient: Home Provider: remote   I discussed the limitations, risks, security and privacy concerns of performing an evaluation and management service by telephone and the availability of in person appointments. I also discussed with the patient that there may be a patient responsible charge related to this service. The patient expressed understanding and agreed to proceed.   History of Present Illness:  Patient presents today for sick visit through telephone visit.  She states that for the past 2 weeks she has been having an itch to her back with dry skin patches noted.  She states that she is starting to itch over basically her entire body including legs and arms and trunk.  She denies any rash.  She states that she does has a few dry patches up to her back. Denies f/c/s, n/v/d, hemoptysis, PND, chest pain or edema.      Observations/Objective:  Vitals with BMI 03/10/2021 11/15/2020 10/21/2020  Height 5\' 4"  5' 7.008" -  Weight 232 lbs 234 lbs 10 oz -  BMI 16.6 06.30 -  Systolic 160 109 323  Diastolic 557 80 91  Pulse 90 90 104      Assessment and Plan:  Pruritus:  May try gentle sensitive skin wash  May try sensitive skin Cetaphil lotion  Will order Lotrisone cream  Will order prednisone   Will order antihistamine   Follow up:  Follow up as needed    I discussed the assessment and treatment plan with the patient. The patient was provided an opportunity to ask questions and all were answered. The patient agreed with the plan and demonstrated an understanding of the instructions.   The patient was advised to call back or seek an in-person evaluation if the symptoms worsen or if the condition fails to improve as anticipated.  I provided 23 minutes of non-face-to-face time  during this encounter.   Fenton Foy, NP

## 2021-04-28 ENCOUNTER — Encounter: Payer: Self-pay | Admitting: Nurse Practitioner

## 2021-04-28 DIAGNOSIS — L299 Pruritus, unspecified: Secondary | ICD-10-CM | POA: Insufficient documentation

## 2021-04-28 NOTE — Patient Instructions (Signed)
Pruritus:  May try gentle sensitive skin wash  May try sensitive skin Cetaphil lotion  Will order Lotrisone cream  Will order prednisone   Will order antihistamine   Follow up:  Follow up as needed

## 2021-06-13 ENCOUNTER — Ambulatory Visit (INDEPENDENT_AMBULATORY_CARE_PROVIDER_SITE_OTHER): Payer: BC Managed Care – PPO | Admitting: Nurse Practitioner

## 2021-06-13 ENCOUNTER — Other Ambulatory Visit: Payer: Self-pay | Admitting: Family

## 2021-06-13 ENCOUNTER — Encounter: Payer: Self-pay | Admitting: Nurse Practitioner

## 2021-06-13 ENCOUNTER — Other Ambulatory Visit: Payer: Self-pay

## 2021-06-13 DIAGNOSIS — I10 Essential (primary) hypertension: Secondary | ICD-10-CM

## 2021-06-13 DIAGNOSIS — L299 Pruritus, unspecified: Secondary | ICD-10-CM | POA: Diagnosis not present

## 2021-06-13 MED ORDER — HYDROXYZINE HCL 10 MG PO TABS: 10.0000 mg | ORAL_TABLET | Freq: Three times a day (TID) | ORAL | 0 refills | Status: DC | PRN

## 2021-06-13 NOTE — Patient Instructions (Signed)
1. Pruritus  - hydrOXYzine (ATARAX) 10 MG tablet; Take 1 tablet (10 mg total) by mouth 3 (three) times daily as needed.  Dispense: 30 tablet; Refill: 0    May try gentle sensitive skin wash   May try sensitive skin Cetaphil lotion   Stay well hydrated  Concerned for possible anxiety/stress reaction - will need further evaluation     Follow up:   Follow up as needed  Dry Skin Care  What causes dry skin?  Dry skin is common and results from inadequate moisture in the outer skin layers. Dry skin usually results from the excessive loss of moisture from the skin surface. This occurs due to two major factors: Normally the skin's oil glands deposit a layer of oil on the skin's surface. This layer of oil prevents the loss of moisture from the skin. Exposure to soaps, cleaners, solvents, and disinfectants removes this oily film, allowing water to escape. Water loss from the skin increases when the humidity is low. During winter months we spend a lot of time indoors where the air is heated. Heated air has very low humidity. This also contributes to dry skin.  A tendency for dry skin may accompany such disorders as eczema. Also, as people age, the number of functioning oil glands decreases, and the tendency toward dry skin can be a sensation of skin tightness when emerging from the shower.  How do I manage dry skin?  Humidify your environment. This can be accomplished by using a humidifier in your bedroom at night during winter months. Bathing can actually put moisture back into your skin if done right. Take the following steps while bathing to sooth dry skin: Avoid hot water, which only dries the skin and makes itching worse. Use warm water. Avoid washcloths or extensive rubbing or scrubbing. Use mild soaps like unscented Dove, Oil of Olay, Cetaphil, Basis, or CeraVe. If you take baths rather than showers, rinse off soap residue with clean water before getting out of tub. Once out of the  shower/tub, pat dry gently with a soft towel. Leave your skin damp. While still damp, apply any medicated ointment/cream you were prescribed to the affected areas. After you apply your medicated ointment/cream, then apply your moisturizer to your whole body.This is the most important step in dry skin care. If this is omitted, your skin will continue to be dry. The choice of moisturizer is also very important. In general, lotion will not provider enough moisture to severely dry skin because it is water based. You should use an ointment or cream. Moisturizers should also be unscented. Good choices include Vaseline (plain petrolatum), Aquaphor, Cetaphil, CeraVe, Vanicream, DML Forte, Aveeno moisture, or Eucerin Cream. Bath oils can be helpful, but do not replace the application of moisturizer after the bath. In addition, they make the tub slippery causing an increased risk for falls. Therefore, we do not recommend their use.

## 2021-06-13 NOTE — Progress Notes (Signed)
Virtual Visit via Telephone Note  I connected with Kristin Burnett on 06/13/21 at  3:40 PM EST by telephone and verified that I am speaking with the correct person using two identifiers.  Location:  Patient: home Provider: office   I discussed the limitations, risks, security and privacy concerns of performing an evaluation and management service by telephone and the availability of in person appointments. I also discussed with the patient that there may be a patient responsible charge related to this service. The patient expressed understanding and agreed to proceed.   History of Present Illness:   Patient presents today for a sick visit through telephone visit.  She states for the past few weeks she has been having itching and dry skin.  She was seen in our office through telephone visit on 04/27/2021 and was prescribed Vistaril.  She states that this did significantly improve her symptoms.  She has ran out of the medication and is requesting a refill.  We discussed that it would be good for her to have a follow-up in person to assess her skin.  Patient denies any significant rash.  Patient did note that her skin was itching worse the other day after attending a funeral.  We discussed that this could possibly be an anxiety reaction.  Denies f/c/s, n/v/d, hemoptysis, PND, chest pain or edema.    Observations/Objective:  Vitals with BMI 03/10/2021 11/15/2020 10/21/2020  Height 5\' 4"  5' 7.008" -  Weight 232 lbs 234 lbs 10 oz -  BMI 34.7 42.59 -  Systolic 563 875 643  Diastolic 329 80 91  Pulse 90 90 104      Assessment and Plan:  Patient Instructions  1. Pruritus  - hydrOXYzine (ATARAX) 10 MG tablet; Take 1 tablet (10 mg total) by mouth 3 (three) times daily as needed.  Dispense: 30 tablet; Refill: 0    May try gentle sensitive skin wash   May try sensitive skin Cetaphil lotion   Stay well hydrated  Concerned for possible anxiety/stress reaction - will need further evaluation      Follow up:   Follow up as needed  Dry Skin Care  What causes dry skin?  Dry skin is common and results from inadequate moisture in the outer skin layers. Dry skin usually results from the excessive loss of moisture from the skin surface. This occurs due to two major factors: Normally the skin's oil glands deposit a layer of oil on the skin's surface. This layer of oil prevents the loss of moisture from the skin. Exposure to soaps, cleaners, solvents, and disinfectants removes this oily film, allowing water to escape. Water loss from the skin increases when the humidity is low. During winter months we spend a lot of time indoors where the air is heated. Heated air has very low humidity. This also contributes to dry skin.  A tendency for dry skin may accompany such disorders as eczema. Also, as people age, the number of functioning oil glands decreases, and the tendency toward dry skin can be a sensation of skin tightness when emerging from the shower.  How do I manage dry skin?  Humidify your environment. This can be accomplished by using a humidifier in your bedroom at night during winter months. Bathing can actually put moisture back into your skin if done right. Take the following steps while bathing to sooth dry skin: Avoid hot water, which only dries the skin and makes itching worse. Use warm water. Avoid washcloths or extensive rubbing or  scrubbing. Use mild soaps like unscented Dove, Oil of Olay, Cetaphil, Basis, or CeraVe. If you take baths rather than showers, rinse off soap residue with clean water before getting out of tub. Once out of the shower/tub, pat dry gently with a soft towel. Leave your skin damp. While still damp, apply any medicated ointment/cream you were prescribed to the affected areas. After you apply your medicated ointment/cream, then apply your moisturizer to your whole body.This is the most important step in dry skin care. If this is omitted, your skin will  continue to be dry. The choice of moisturizer is also very important. In general, lotion will not provider enough moisture to severely dry skin because it is water based. You should use an ointment or cream. Moisturizers should also be unscented. Good choices include Vaseline (plain petrolatum), Aquaphor, Cetaphil, CeraVe, Vanicream, DML Forte, Aveeno moisture, or Eucerin Cream. Bath oils can be helpful, but do not replace the application of moisturizer after the bath. In addition, they make the tub slippery causing an increased risk for falls. Therefore, we do not recommend their use.       I discussed the assessment and treatment plan with the patient. The patient was provided an opportunity to ask questions and all were answered. The patient agreed with the plan and demonstrated an understanding of the instructions.   The patient was advised to call back or seek an in-person evaluation if the symptoms worsen or if the condition fails to improve as anticipated.  I provided 23 minutes of non-face-to-face time during this encounter.   Fenton Foy, NP

## 2021-06-14 ENCOUNTER — Telehealth: Payer: BC Managed Care – PPO | Admitting: Family

## 2021-08-01 ENCOUNTER — Other Ambulatory Visit: Payer: Self-pay

## 2021-08-01 ENCOUNTER — Ambulatory Visit: Payer: Self-pay | Admitting: *Deleted

## 2021-08-01 DIAGNOSIS — L299 Pruritus, unspecified: Secondary | ICD-10-CM

## 2021-08-01 MED ORDER — HYDROXYZINE HCL 10 MG PO TABS: 10.0000 mg | ORAL_TABLET | Freq: Three times a day (TID) | ORAL | 0 refills | Status: DC | PRN

## 2021-08-01 NOTE — Telephone Encounter (Signed)
I returned pt's call.  C/o entire body rash that is itching on and off.  PCP gave her a prescription for this in the past and it helped.   Pt going out of town tomorrow. ?Reason for Disposition ? SEVERE itching (i.e., interferes with sleep, normal activities or school) ? ?Answer Assessment - Initial Assessment Questions ?1. APPEARANCE of RASH: "Describe the rash." (e.g., spots, blisters, raised areas, skin peeling, scaly) ?    Has a rash all over her body.   This has happened several times.   I'm trying to get an appt.   I want to see what is causing this rash. ? ?2. SIZE: "How big are the spots?" (e.g., tip of pen, eraser, coin; inches, centimeters) ?    No bumps.   It just itches.    My back has some dry spots on it before.  ?3. LOCATION: "Where is the rash located?" ?    I haven't changed anything.    ?It's all over.    Last time this happened I scratched so much.   I'm itching ?4. COLOR: "What color is the rash?" (Note: It is difficult to assess rash color in people with darker-colored skin. When this situation occurs, simply ask the caller to describe what they see.) ?    No color ?5. ONSET: "When did the rash begin?" ?    Started yesterday.    ? ?They gave me an antihistamine that worked.   I don't know the name of it. ?6. FEVER: "Do you have a fever?" If Yes, ask: "What is your temperature, how was it measured, and when did it start?" ?    *No Answer* ?7. ITCHING: "Does the rash itch?" If Yes, ask: "How bad is the itch?" (Scale 1-10; or mild, moderate, severe) ?    Yes ?8. CAUSE: "What do you think is causing the rash?" ?    Don't know ?9. MEDICINE FACTORS: "Have you started any new medicines within the last 2 weeks?" (e.g., antibiotics)  ?    *No Answer* ?10. OTHER SYMPTOMS: "Do you have any other symptoms?" (e.g., dizziness, headache, sore throat, joint pain) ?      *No Answer* ?11. PREGNANCY: "Is there any chance you are pregnant?" "When was your last menstrual period?" ?      *No Answer* ? ?Protocols  used: Rash or Redness - Widespread-A-AH ? ?

## 2021-08-01 NOTE — Telephone Encounter (Signed)
Spoke to pt advised 30 courtesy sent into pharmacy. Keep scheduled appt on 04/21 ?

## 2021-08-01 NOTE — Telephone Encounter (Signed)
?  Chief Complaint: rash all over her body that itches.  Requesting hydroxyzine (Atarax) 10 mg for it.   Has had Atarax for this rash before and it worked well.  She is leaving town 08/02/2021 so is it possible it could be called in today? ?Symptoms: itching all over ?Frequency: Most of the time it's itching. ?Pertinent Negatives: Patient denies know the cause of this rash. ?Disposition: '[]'$ ED /'[]'$ Urgent Care (no appt availability in office) / Appointment(In office/virtual)/ '[]'$  [facility]/ '[]'$ Home Care/ '[]'$ Refused Recommended Disposition /'[]'$ [facility]/ '[]'$  Follow-up with PCP ?Additional Notes: I called into the office and spoke with Butch Penny.  She was able to get her scheduled for next week.     ?

## 2021-08-05 NOTE — Progress Notes (Signed)
? ? ?Patient ID: Kristin Burnett, female    DOB: 08-Apr-1966  MRN: [id number] ? ?CC: Rash ? ?Subjective: ?Kristin Burnett is a 56 y.o. female who presents for rash. ? ?Her concerns today include:  ?RASH: ?06/13/2021 with Lazaro Arms, NP: ?Patient presents today for a sick visit through telephone visit.  She states for the past few weeks she has been having itching and dry skin.  She was seen in our office through telephone visit on 04/27/2021 and was prescribed Vistaril.  She states that this did significantly improve her symptoms.  She has ran out of the medication and is requesting a refill.  We discussed that it would be good for her to have a follow-up in person to assess her skin.  Patient denies any significant rash.  Patient did note that her skin was itching worse the other day after attending a funeral.  We discussed that this could possibly be an anxiety reaction. ? ?- hydrOXYzine (ATARAX) 10 MG tablet; Take 1 tablet (10 mg total) by mouth 3 (three) times daily as needed.  Dispense: 30 tablet; Refill: 0 ?  ?  ?May try gentle sensitive skin wash ?  ?May try sensitive skin Cetaphil lotion ?  ?Stay well hydrated ?  ?Concerned for possible anxiety/stress reaction - will need further evaluation ?  ?08/12/2021: ?Reports doesn't have rash only itching. Would like to know cause of itching. Vistaril helping.  ? ?2. HYPERTENSION FOLLOW-UP: ?11/15/2020: ?- Continue Lisinopril-Hydrochlorothiazide and Amlodipine as prescribed. ? ?08/12/2021: ?Doing well on current regimen, no issues/concerns. Taking medication as prescribed. Does cook with salt but usually doesn't add additional after cooking. No exercise outside of normal routine. Smoking one pack of cigarettes daily. Denies shortness of breath, chest pain, and additional red flag symptoms.  ? ?3. WEIGHT MANAGEMENT: ?Would like to begin [facility]. Weight loss goal of under 200 pounds.  ? ? ?  03/10/2021  ? 10:15 AM 11/15/2020  ?  9:40 AM 08/16/2020  ?  9:53 AM 08/04/2020  ?  5:44 PM  01/28/2020  ?  3:10 PM  ?Depression screen PHQ 2/9  ?Decreased Interest 0 0 0 1 0  ?Down, Depressed, Hopeless 0 0  0 0  ?PHQ - 2 Score 0 0 0 1 0  ?Altered sleeping 0 0 0 1   ?Tired, decreased energy 1 0 0 1   ?Change in appetite 0 0 0 0   ?Feeling bad or failure about yourself  0 0 0 0   ?Trouble concentrating 0 0 0 0   ?Moving slowly or fidgety/restless 0 0 0 0   ?Suicidal thoughts 0 0 0 0   ?PHQ-9 Score 1 0 0 3   ?Difficult doing work/chores  Not difficult at all     ? ? ?Patient Active Problem List  ? Diagnosis Date Noted  ? Pruritus 04/28/2021  ? Upper respiratory tract infection 03/10/2021  ? Fever, unspecified 03/10/2021  ? Hyperlipidemia 07/03/2019  ? CKD (chronic kidney disease) 07/03/2019  ? Tachypnea 04/29/2017  ? 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 FOM (frequency of micturition) 07/06/2014  ? Phlebitis of arm 10/20/2013  ? Anxiety and depression 03/05/2013  ? Bronchospasm 01/09/2013  ? Annual physical exam 09/19/2012  ? Edema of   legs, L>R 09/19/2012  ? GERD (gastroesophageal reflux disease) 09/19/2012  ? LEIOMYOMA OF UTERUS, UNSPECIFIED 10/13/2009  ? ANEMIA, HYPOCHROMIC 10/13/2009  ? Essential hypertension, benign 10/13/2009  ? Recurrent UTI, h/o urethra diverticuli 10/13/2009  ? DYSFUNCTIONAL UTERINE BLEEDING 10/13/2009  ?  ? ?  Current Outpatient Medications on File Prior to Visit  ?Medication Sig Dispense Refill  ? atorvastatin (LIPITOR) 20 MG tablet Take 1 tablet (20 mg total) by mouth daily. 90 tablet 3  ? clotrimazole-betamethasone (LOTRISONE) cream Apply 1 application topically daily. 30 g 0  ? hydrOXYzine (ATARAX) 10 MG tablet Take 1 tablet (10 mg total) by mouth 3 (three) times daily as needed. 30 tablet 0  ? ?No current facility-administered medications on file prior to visit.  ? ? ?No Known Allergies ? ?Social History  ? ?Socioeconomic History  ? Marital status: Divorced  ?  Spouse name: Not on file  ? Number of children: 3  ? Years of education: Not on file  ? Highest education level: Not on  file  ?Occupational History  ? Occupation: bus driver  ?  Employer: [employer]  ?Tobacco Use  ? Smoking status: Some Days  ?  Packs/day: 1.00  ?  Years: 36.00  ?  Pack years: 36.00  ?  Types: Cigarettes  ?  Start date: 06/21/1986  ?  Passive exposure: Current  ? Smokeless tobacco: Never  ? Tobacco comments:  ?  1 pack/day  ?Vaping Use  ? Vaping Use: Never used  ?Substance and Sexual Activity  ? Alcohol use: No  ?  Alcohol/week: 0.0 standard drinks  ? Drug use: No  ? Sexual activity: Not Currently  ?  Birth control/protection: Surgical  ?Other Topics Concern  ? Not on file  ?Social History Narrative  ? Household: pt and 2 children Regular exercise: was; not been in 1 mth Caffeine use: coffee daily  ? ?Social Determinants of Health  ? ?Financial Resource Strain: Not on file  ?Food Insecurity: Not on file  ?Transportation Needs: Not on file  ?Physical Activity: Not on file  ?Stress: Not on file  ?Social Connections: Not on file  ?Intimate Partner Violence: Not on file  ? ? ?Family History  ?Problem Relation Age of Onset  ? Hypertension Mother   ? Arthritis Mother   ? Anemia Mother   ? Cancer Mother   ?     uterine?  ? Colon cancer Mother 34  ? Diabetes Father   ? Stroke Father 58  ? Heart disease Father   ?     has a defib  ? Hypertension Father   ? Heart failure Father   ? Heart disease Brother   ? Heart failure Brother   ? Leukemia Maternal Grandmother   ? Breast cancer Neg Hx   ? Esophageal cancer Neg Hx   ? Stomach cancer Neg Hx   ? Rectal cancer Neg Hx   ? Colon polyps Neg Hx   ? ? ?Past Surgical History:  ?Procedure Laterality Date  ? BILATERAL SALPINGECTOMY  03/07/2012  ? Procedure: BILATERAL SALPINGECTOMY;  Surgeon: Lavonia Drafts, MD;  Location: [facility];  Service: Gynecology;  Laterality: Bilateral;  ? CYSTOSCOPY  03/07/2012  ? Procedure: CYSTOSCOPY;  Surgeon: Lavonia Drafts, MD;  Location: [facility];  Service: Gynecology;  Laterality: N/A;  ? LASER ABLATION    ? "before they took my  uterus out"  ? ROBOTIC ASSISTED TOTAL HYSTERECTOMY  03/07/2012  ? Procedure: ROBOTIC ASSISTED TOTAL HYSTERECTOMY;  Surgeon: Lavonia Drafts, MD;  Location: [facility];  Service: Gynecology;  Laterality: N/A;  ? TUBAL LIGATION    ? URETHRAL DIVERTICULECTOMY    ? VIDEO BRONCHOSCOPY Bilateral 05/01/2017  ? Procedure: VIDEO BRONCHOSCOPY WITH FLUORO;  Surgeon: Marshell Garfinkel, MD;  Location: [facility];  Service: Cardiopulmonary;  Laterality: Bilateral;  ? ? ?ROS: ?Review of Systems ?Negative except as stated above ? ?PHYSICAL EXAM: ?BP 121/83 (BP Location: Left Arm, Patient Position: Sitting, Cuff Size: Large)   Pulse 92   Temp 98.3 ?F (36.8 ?C)   Resp 18   Ht 5' 4.02" (1.626 m)   Wt 223 lb (101.2 kg)   LMP 02/29/2012 (Exact Date)   SpO2 97%   BMI 38.26 kg/m?  ? ?Physical Exam ?HENT:  ?   Head: Normocephalic and atraumatic.  ?Eyes:  ?   Extraocular Movements: Extraocular movements intact.  ?   Conjunctiva/sclera: Conjunctivae normal.  ?   Pupils: Pupils are equal, round, and reactive to light.  ?Cardiovascular:  ?   Rate and Rhythm: Normal rate and regular rhythm.  ?   Pulses: Normal pulses.  ?   Heart sounds: Normal heart sounds.  ?Pulmonary:  ?   Effort: Pulmonary effort is normal.  ?   Breath sounds: Normal breath sounds.  ?Musculoskeletal:  ?   Cervical back: Normal range of motion and neck supple.  ?Neurological:  ?   General: No focal deficit present.  ?   Mental Status: She is alert and oriented to person, place, and time.  ?Psychiatric:     ?   Mood and Affect: Mood normal.     ?   Behavior: Behavior normal.  ? ?ASSESSMENT AND PLAN: ?1. Pruritus: ?- Continue Hydroxyzine as prescribed.  ?- Referral to Dermatology for further evaluation and management. ?- Ambulatory referral to Dermatology ?- hydrOXYzine (ATARAX) 10 MG tablet; Take 1 tablet (10 mg total) by mouth 3 (three) times daily as needed.  Dispense: 30 tablet; Refill: 2 ? ?2. Essential hypertension: ?- Continue Lisinopril-Hydrochlorothiazide and  Amlodipine as prescribed. ?- Counseled on blood pressure goal of less than 130/80, low-sodium, DASH diet, medication compliance, and 150 minutes of moderate intensity exercise per week as tolerated. Counseled on medi

## 2021-08-12 ENCOUNTER — Ambulatory Visit: Payer: BC Managed Care – PPO | Admitting: Family

## 2021-08-12 ENCOUNTER — Encounter: Payer: Self-pay | Admitting: Family

## 2021-08-12 VITALS — BP 121/83 | HR 92 | Temp 98.3°F | Resp 18 | Ht 64.02 in | Wt 223.0 lb

## 2021-08-12 DIAGNOSIS — I10 Essential (primary) hypertension: Secondary | ICD-10-CM | POA: Diagnosis not present

## 2021-08-12 DIAGNOSIS — L299 Pruritus, unspecified: Secondary | ICD-10-CM | POA: Diagnosis not present

## 2021-08-12 DIAGNOSIS — J3089 Other allergic rhinitis: Secondary | ICD-10-CM

## 2021-08-12 DIAGNOSIS — Z7689 Persons encountering health services in other specified circumstances: Secondary | ICD-10-CM

## 2021-08-12 MED ORDER — CETIRIZINE HCL 5 MG PO TABS: 5.0000 mg | ORAL_TABLET | Freq: Every day | ORAL | 1 refills | Status: DC

## 2021-08-12 MED ORDER — LISINOPRIL-HYDROCHLOROTHIAZIDE 20-25 MG PO TABS: 1.0000 | ORAL_TABLET | Freq: Every day | ORAL | 3 refills | Status: DC

## 2021-08-12 MED ORDER — FLUTICASONE PROPIONATE 50 MCG/ACT NA SUSP: 2.0000 | Freq: Every day | NASAL | 1 refills | Status: DC

## 2021-08-12 MED ORDER — AMLODIPINE BESYLATE 10 MG PO TABS: 10.0000 mg | ORAL_TABLET | Freq: Every day | ORAL | 3 refills | Status: DC

## 2021-08-12 MED ORDER — HYDROXYZINE HCL 10 MG PO TABS: 10.0000 mg | ORAL_TABLET | Freq: Three times a day (TID) | ORAL | 2 refills | Status: DC | PRN

## 2021-08-12 NOTE — Progress Notes (Signed)
Pt presents for itching, pt states that she does not actually have a rash present she has generalized itching over the entire body ?

## 2021-08-13 ENCOUNTER — Other Ambulatory Visit: Payer: Self-pay | Admitting: Family

## 2021-08-13 DIAGNOSIS — N1832 Chronic kidney disease, stage 3b: Secondary | ICD-10-CM

## 2021-08-13 LAB — BASIC METABOLIC PANEL
BUN/Creatinine Ratio: 19 (ref 9–23)
BUN: 30 mg/dL — ABNORMAL HIGH (ref 6–24)
CO2: 23 mmol/L (ref 20–29)
Calcium: 10.2 mg/dL (ref 8.7–10.2)
Chloride: 101 mmol/L (ref 96–106)
Creatinine, Ser: 1.61 mg/dL — ABNORMAL HIGH (ref 0.57–1.00)
Glucose: 101 mg/dL — ABNORMAL HIGH (ref 70–99)
Potassium: 4.2 mmol/L (ref 3.5–5.2)
Sodium: 140 mmol/L (ref 134–144)
eGFR: 38 mL/min/{1.73_m2} — ABNORMAL LOW (ref >59)

## 2021-08-13 NOTE — Progress Notes (Signed)
Kidney function lower than normal. Referral to Nephrology for further evaluation and management. Their office should call within 2 weeks with appointment details.

## 2021-09-07 ENCOUNTER — Telehealth: Payer: Self-pay | Admitting: Family

## 2021-09-07 NOTE — Telephone Encounter (Signed)
Called pt lvm to advise that she should be fine as long as she keeps 5/25 appt  ?

## 2021-09-07 NOTE — Telephone Encounter (Signed)
Copied from [facility] [id number]. Topic: Appointment Scheduling - Scheduling Inquiry for Clinic ?>> Sep 06, 2021  1:29 PM Erick Blinks wrote: ?Reason for CRM: Pt believes she needs to be seen this week according to the timing of her injections and weight loss check. Please advise  ?Best contact: [phone] ?

## 2021-09-12 ENCOUNTER — Ambulatory Visit: Payer: Self-pay

## 2021-09-12 NOTE — Telephone Encounter (Signed)
Pt stated she would like a refill of hydroxyzine for continued itching. Pt requesting 2 month supply.  Pt has 2 refills left- called [facility] and no one answered. Called pt and advised her of name of med and dosage and that she has 2 RF remaining. Answer Assessment - Initial Assessment Questions 1. DRUG NAME: "What medicine do you need to have refilled?"     hydroxyzine 2. REFILLS REMAINING: "How many refills are remaining?" (Note: The label on the medicine or pill bottle will show how many refills are remaining. If there are no refills remaining, then a renewal may be needed.)     no 3. EXPIRATION DATE: "What is the expiration date?" (Note: The label states when the prescription will expire, and thus can no longer be refilled.)     N/a 4. PRESCRIBING HCP: "Who prescribed it?" Reason: If prescribed by specialist, call should be referred to that group.     Dr. Redmond Pulling 5. SYMPTOMS: "Do you have any symptoms?"     itching  Protocols used: Medication Refill and Renewal Call-A-AH

## 2021-09-15 ENCOUNTER — Ambulatory Visit: Payer: BC Managed Care – PPO | Admitting: Family Medicine

## 2021-11-03 ENCOUNTER — Ambulatory Visit: Payer: BC Managed Care – PPO | Admitting: Family Medicine

## 2021-12-05 ENCOUNTER — Ambulatory Visit: Payer: BC Managed Care – PPO | Admitting: Family Medicine

## 2021-12-12 ENCOUNTER — Ambulatory Visit: Payer: BC Managed Care – PPO | Admitting: Family Medicine

## 2021-12-28 ENCOUNTER — Ambulatory Visit: Payer: BC Managed Care – PPO | Admitting: Family Medicine

## 2022-01-16 ENCOUNTER — Encounter: Payer: Self-pay | Admitting: Family Medicine

## 2022-01-16 ENCOUNTER — Ambulatory Visit (INDEPENDENT_AMBULATORY_CARE_PROVIDER_SITE_OTHER): Payer: BC Managed Care – PPO | Admitting: Family Medicine

## 2022-01-16 VITALS — BP 154/101 | Ht 64.0 in | Wt 217.0 lb

## 2022-01-16 DIAGNOSIS — I10 Essential (primary) hypertension: Secondary | ICD-10-CM | POA: Diagnosis not present

## 2022-01-16 DIAGNOSIS — R252 Cramp and spasm: Secondary | ICD-10-CM | POA: Diagnosis not present

## 2022-01-16 MED ORDER — LISINOPRIL 40 MG PO TABS: 40.0000 mg | ORAL_TABLET | Freq: Every day | ORAL | 0 refills | Status: DC

## 2022-01-19 ENCOUNTER — Encounter: Payer: Self-pay | Admitting: Family Medicine

## 2022-01-19 NOTE — Progress Notes (Signed)
Established Patient Office Visit  Subjective    Patient ID: Kristin Burnett, female    DOB: 1966/04/15  Age: 56 y.o. MRN: [id number]  CC:  Chief Complaint  Patient presents with   Medication Reaction    Patient experiences cramping in legs and toes, when she takes her lisinopril consistently for 3-4 days    Knee Pain    Right knee pain     HPI Kristin Burnett presents for follow up of hypertension. Patient reports that every time that she takes the lisinopril with the HCT she develops severe cramping in her legs.    Outpatient Encounter Medications as of 01/16/2022  Medication Sig   lisinopril (ZESTRIL) 40 MG tablet Take 1 tablet (40 mg total) by mouth daily.   amLODipine (NORVASC) 10 MG tablet Take 1 tablet (10 mg total) by mouth daily.   atorvastatin (LIPITOR) 20 MG tablet Take 1 tablet (20 mg total) by mouth daily.   cetirizine (ZYRTEC) 5 MG tablet Take 1 tablet (5 mg total) by mouth daily. (Patient not taking: Reported on 01/16/2022)   clotrimazole-betamethasone (LOTRISONE) cream Apply 1 application topically daily. (Patient not taking: Reported on 01/16/2022)   fluticasone (FLONASE) 50 MCG/ACT nasal spray Place 2 sprays into both nostrils daily.   hydrOXYzine (ATARAX) 10 MG tablet Take 1 tablet (10 mg total) by mouth 3 (three) times daily as needed.   [DISCONTINUED] lisinopril-hydrochlorothiazide (ZESTORETIC) 20-25 MG tablet Take 1 tablet by mouth daily.   No facility-administered encounter medications on file as of 01/16/2022.    Past Medical History:  Diagnosis Date   Bronchospasm 01/09/2013   DUB (dysfunctional uterine bleeding)    GERD (gastroesophageal reflux disease)    otc tums   History of blood transfusion 2011   "related to fibrods"   Hypertension    Pneumonia ~ 2015; 04/29/2017   Recurrent UTI, h/o urethra diverticuli 10/13/2009   Qualifier: Diagnosis of  By: Kristin Burnett, Kristin     Urethral diverticulum    Dr. Estill Burnett   Yeast vaginitis    Dr. Estill Burnett    Past  Surgical History:  Procedure Laterality Date   BILATERAL SALPINGECTOMY  03/07/2012   Procedure: BILATERAL SALPINGECTOMY;  Surgeon: Lavonia Drafts, Burnett;  Location: [facility];  Service: Gynecology;  Laterality: Bilateral;   CYSTOSCOPY  03/07/2012   Procedure: CYSTOSCOPY;  Surgeon: Lavonia Drafts, Burnett;  Location: [facility];  Service: Gynecology;  Laterality: N/A;   LASER ABLATION     "before they took my uterus out"   ROBOTIC ASSISTED TOTAL HYSTERECTOMY  03/07/2012   Procedure: ROBOTIC ASSISTED TOTAL HYSTERECTOMY;  Surgeon: Lavonia Drafts, Burnett;  Location: [facility];  Service: Gynecology;  Laterality: N/A;   TUBAL LIGATION     URETHRAL DIVERTICULECTOMY     VIDEO BRONCHOSCOPY Bilateral 05/01/2017   Procedure: VIDEO BRONCHOSCOPY WITH FLUORO;  Surgeon: Marshell Garfinkel, Burnett;  Location: [facility];  Service: Cardiopulmonary;  Laterality: Bilateral;    Family History  Problem Relation Age of Onset   Hypertension Mother    Arthritis Mother    Anemia Mother    Cancer Mother        uterine?   Colon cancer Mother 67   Diabetes Father    Stroke Father 40   Heart disease Father        has a defib   Hypertension Father    Heart failure Father    Heart disease Brother    Heart failure Brother    Leukemia Maternal Grandmother    Breast  cancer Neg Hx    Esophageal cancer Neg Hx    Stomach cancer Neg Hx    Rectal cancer Neg Hx    Colon polyps Neg Hx     Social History   Socioeconomic History   Marital status: Divorced    Spouse name: Not on file   Number of children: 3   Years of education: Not on file   Highest education level: Not on file  Occupational History   Occupation: bus driver    Employer: [employer]  Tobacco Use   Smoking status: Some Days    Packs/day: 1.00    Years: 36.00    Total pack years: 36.00    Types: Cigarettes    Start date: 06/21/1986    Passive exposure: Current   Smokeless tobacco: Never   Tobacco comments:    1 pack/day  Vaping  Use   Vaping Use: Never used  Substance and Sexual Activity   Alcohol use: No    Alcohol/week: 0.0 standard drinks of alcohol   Drug use: No   Sexual activity: Not Currently    Birth control/protection: Surgical  Other Topics Concern   Not on file  Social History Narrative   Household: pt and 2 children Regular exercise: was; not been in 1 mth Caffeine use: coffee daily   Social Determinants of Health   Financial Resource Strain: Not on file  Food Insecurity: Not on file  Transportation Needs: Not on file  Physical Activity: Not on file  Stress: Not on file  Social Connections: Not on file  Intimate Partner Violence: Not on file    Review of Systems  All other systems reviewed and are negative.       Objective    BP (!) 154/101   Ht '5\' 4"'$  (1.626 m)   Wt 217 lb (98.4 kg)   LMP 02/29/2012 (Exact Date)   SpO2 93%   BMI 37.25 kg/m   Physical Exam Vitals and nursing note reviewed.  Constitutional:      General: She is not in acute distress. Cardiovascular:     Rate and Rhythm: Normal rate and regular rhythm.  Pulmonary:     Effort: Pulmonary effort is normal.     Breath sounds: Normal breath sounds.  Abdominal:     Palpations: Abdomen is soft.     Tenderness: There is no abdominal tenderness.  Musculoskeletal:     Right lower leg: No edema.     Left lower leg: No edema.  Neurological:     General: No focal deficit present.     Mental Status: She is alert and oriented to person, place, and time.         Assessment & Plan:   1. Essential hypertension Elevated reading. Discussed compliance. Will change from lisinopril -HCT 20-25 to lisinopril 40 mg and monitor.   2. Leg cramps As above   Return in about 4 weeks (around 02/13/2022).   Becky Sax, Burnett

## 2022-01-26 ENCOUNTER — Ambulatory Visit: Payer: BC Managed Care – PPO | Admitting: Family Medicine

## 2022-03-31 ENCOUNTER — Other Ambulatory Visit: Payer: Self-pay | Admitting: Family Medicine

## 2022-03-31 DIAGNOSIS — L299 Pruritus, unspecified: Secondary | ICD-10-CM

## 2022-03-31 MED ORDER — HYDROXYZINE HCL 10 MG PO TABS: 10.0000 mg | ORAL_TABLET | Freq: Three times a day (TID) | ORAL | 0 refills | Status: DC | PRN

## 2022-03-31 NOTE — Telephone Encounter (Signed)
Medication Refill - Medication: hydrOXYzine (ATARAX) 10 MG tablet / pt stated that she really needs this medication today for the weekend   Has the patient contacted their pharmacy? No. (Agent: If no, request that the patient contact the pharmacy for the refill. If patient does not wish to contact the pharmacy document the reason why and proceed with request.) (Agent: If yes, when and what did the pharmacy advise?)  Preferred Pharmacy (with phone number or street name): [facility] [place] [facility] [address]  Has the patient been seen for an appointment in the last year OR does the patient have an upcoming appointment? Yes.    Agent: Please be advised that RX refills may take up to 3 business days. We ask that you follow-up with your pharmacy.

## 2022-03-31 NOTE — Telephone Encounter (Signed)
Requested Prescriptions  Pending Prescriptions Disp Refills   hydrOXYzine (ATARAX) 10 MG tablet 270 tablet 0    Sig: Take 1 tablet (10 mg total) by mouth 3 (three) times daily as needed.     Ear, Nose, and Throat:  Antihistamines 2 Failed - 03/31/2022  9:45 AM      Failed - Cr in normal range and within 360 days    Creatinine, Ser  Date Value Ref Range Status  08/12/2021 1.61 (H) 0.57 - 1.00 mg/dL Final         Passed - Valid encounter within last 12 months    Recent Outpatient Visits           2 months ago Essential hypertension   [facility] at [facility], MD   7 months ago Pruritus   Primary Care at [facility], Amy J, NP   9 months ago Pruritus   Primary Care at [facility], Kriste Basque, NP   11 months ago Pruritus   Primary Care at [facility], Kriste Basque, NP   1 year ago Upper respiratory tract infection, unspecified type   Primary Care at [facility], Kriste Basque, NP

## 2022-07-04 ENCOUNTER — Ambulatory Visit: Payer: Self-pay

## 2022-07-04 NOTE — Telephone Encounter (Signed)
  Chief Complaint: Yeast infection Symptoms: itching Frequency: 2 days Pertinent Negatives: Patient denies  Disposition: [] ED /[] Urgent Care (no appt availability in office) / [] Appointment(In office/virtual)/ []  [facility]/ [] Home Care/ [x] Refused Recommended Disposition /[]  Mobile Bus/ []  Follow-up with PCP Additional Notes: PT states that she thinks this is a yeast infection. Pt states that she does not want to try monistat. She would like Diflucan called in.   Please advise.    Summary: Vaginal discomfort/itching   The patient has had vaginal discomfort. She believes she may have a yeast infection as she is itching very badly. Please assist patient further     Reason for Disposition  [1] Symptoms of a yeast infection (i.e., itchy, white discharge, not bad smelling) AND [2] not improved > 3 days following Care Advice  Answer Assessment - Initial Assessment Questions 1. SYMPTOM: "What's the main symptom you're concerned about?" (e.g., pain, itching, dryness)     Itching 2. LOCATION: "Where is the  s/s located?" (e.g., inside/outside, left/right)     Vulva 3. ONSET: "When did the  s/s  start?"     yesterday 4. PAIN: "Is there any pain?" If Yes, ask: "How bad is it?" (Scale: 1-10; mild, moderate, severe)   -  MILD (1-3): Doesn't interfere with normal activities.    -  MODERATE (4-7): Interferes with normal activities (e.g., work or school) or awakens from sleep.     -  SEVERE (8-10): Excruciating pain, unable to do any normal activities.     no 5. ITCHING: "Is there any itching?" If Yes, ask: "How bad is it?" (Scale: 1-10; mild, moderate, severe)     yes 6. CAUSE: "What do you think is causing the discharge?" "Have you had the same problem before? What happened then?"     Yeast 7. OTHER SYMPTOMS: "Do you have any other symptoms?" (e.g., fever, itching, vaginal bleeding, pain with urination, injury to genital area, vaginal foreign body)     no  Protocols  used: Vaginal Symptoms-A-AH

## 2022-07-06 ENCOUNTER — Ambulatory Visit: Payer: BC Managed Care – PPO | Admitting: Family Medicine

## 2022-07-11 ENCOUNTER — Other Ambulatory Visit: Payer: Self-pay | Admitting: Family Medicine

## 2022-07-11 DIAGNOSIS — I10 Essential (primary) hypertension: Secondary | ICD-10-CM

## 2022-07-11 NOTE — Telephone Encounter (Signed)
Medication Refill - Medication: amLODipine (NORVASC) 10 MG tablet  lisinopril (ZESTRIL) 40 MG tablet    Has the patient contacted their pharmacy? Yes.    Pt has 0 refills.  Pt states that she is out of both medications. Pt scheduled an appt for 07/17/22. Pt is wanting to know if she can get a short refill until her appt 07/17/22.   Preferred Pharmacy (with phone number or street name):  [facility] [address] Phone: [phone]  Fax: [phone]     Has the patient been seen for an appointment in the last year OR does the patient have an upcoming appointment? Yes.    Agent: Please be advised that RX refills may take up to 3 business days. We ask that you follow-up with your pharmacy.

## 2022-07-12 MED ORDER — AMLODIPINE BESYLATE 10 MG PO TABS: 10.0000 mg | ORAL_TABLET | Freq: Every day | ORAL | 0 refills | Status: DC

## 2022-07-12 MED ORDER — LISINOPRIL 40 MG PO TABS: 40.0000 mg | ORAL_TABLET | Freq: Every day | ORAL | 0 refills | Status: DC

## 2022-07-12 NOTE — Telephone Encounter (Signed)
Patient request short supply to be refilled until scheduled OV 07/16/22.  Requested Prescriptions  Pending Prescriptions Disp Refills   amLODipine (NORVASC) 10 MG tablet 30 tablet 0    Sig: Take 1 tablet (10 mg total) by mouth daily.     Cardiovascular: Calcium Channel Blockers 2 Failed - 07/11/2022 11:56 AM      Failed - Last BP in normal range    BP Readings from Last 1 Encounters:  01/16/22 (!) 154/101         Passed - Last Heart Rate in normal range    Pulse Readings from Last 1 Encounters:  08/12/21 92         Passed - Valid encounter within last 6 months    Recent Outpatient Visits           5 months ago Essential hypertension   [place] [facility], MD   11 months ago Pr[place]   [place] [facility] [place], NP   1 year ago Pruritus   [place] [facility], Kriste Basque, NP   1 year ago Pruritus   [place] [facility], Kriste Basque, NP   1 year ago Upper respiratory tract infection, unspecified type   [facility] at [facility], Kriste Basque, NP       Future Appointments             In 5 days Camillia Herter, NP [place] at [facility]             lisinopril (ZESTRIL) 40 MG tablet 30 tablet 0    Sig: Take 1 tablet (40 mg total) by mouth daily.     Cardiovascular:  ACE Inhibitors Failed - 07/11/2022 11:56 AM      Failed - Cr in normal range and within 180 days    Creatinine, Ser  Date Value Ref Range Status  08/12/2021 1.61 (H) 0.57 - 1.00 mg/dL Final         Failed - K in normal range and within 180 days    Potassium  Date Value Ref Range Status  08/12/2021 4.2 3.5 - 5.2 mmol/L Final         Failed - Last BP in normal range    BP Readings from Last 1 Encounters:  01/16/22 (!) 154/101         Passed - Patient is not pregnant      Passed - Valid encounter within last 6 months    Recent  Outpatient Visits           5 months ago Essential hypertension   [place] [facility], MD   11 months ago Pruritus   [place] [facility] [place], NP   1 year ago Pruritus   [place] [facility], Kriste Basque, NP   1 year ago Pruritus   [place] [facility] at [facility], Kriste Basque, NP   1 year ago Upper respiratory tract infection, unspecified type   [facility] at [facility], Kriste Basque, NP       Future Appointments             In 5 days Camillia Herter, NP [place] at [facility]

## 2022-07-17 ENCOUNTER — Encounter: Payer: Self-pay | Admitting: Family Medicine

## 2022-07-17 ENCOUNTER — Ambulatory Visit (INDEPENDENT_AMBULATORY_CARE_PROVIDER_SITE_OTHER): Payer: BC Managed Care – PPO | Admitting: Family Medicine

## 2022-07-17 VITALS — BP 129/91 | HR 88 | Temp 98.7°F | Resp 16 | Wt 219.2 lb

## 2022-07-17 DIAGNOSIS — E785 Hyperlipidemia, unspecified: Secondary | ICD-10-CM | POA: Diagnosis not present

## 2022-07-17 DIAGNOSIS — I10 Essential (primary) hypertension: Secondary | ICD-10-CM | POA: Diagnosis not present

## 2022-07-17 MED ORDER — LISINOPRIL 40 MG PO TABS
40.0000 mg | ORAL_TABLET | Freq: Every day | ORAL | 1 refills | Status: DC
Start: 2022-07-17 — End: 2023-04-19

## 2022-07-17 MED ORDER — SPIRONOLACTONE 25 MG PO TABS: 25.0000 mg | ORAL_TABLET | Freq: Every day | ORAL | 0 refills | Status: DC

## 2022-07-17 MED ORDER — ATORVASTATIN CALCIUM 20 MG PO TABS: 20.0000 mg | ORAL_TABLET | Freq: Every day | ORAL | 1 refills | Status: DC

## 2022-07-17 MED ORDER — AMLODIPINE BESYLATE 10 MG PO TABS: 10.0000 mg | ORAL_TABLET | Freq: Every day | ORAL | 1 refills | Status: DC

## 2022-07-17 NOTE — Progress Notes (Unsigned)
New Patient Office Visit  Subjective    Patient ID: Kristin Burnett, female    DOB: 1965/10/16  Age: 57 y.o. MRN: [id number]  CC:  Chief Complaint  Patient presents with   Follow-up   Hypertension    HPI Kristin Burnett presents to establish care ***  Outpatient Encounter Medications as of 07/17/2022  Medication Sig   amLODipine (NORVASC) 10 MG tablet Take 1 tablet (10 mg total) by mouth daily.   atorvastatin (LIPITOR) 20 MG tablet Take 1 tablet (20 mg total) by mouth daily.   fluticasone (FLONASE) 50 MCG/ACT nasal spray Place 2 sprays into both nostrils daily.   hydrOXYzine (ATARAX) 10 MG tablet Take 1 tablet (10 mg total) by mouth 3 (three) times daily as needed.   lisinopril (ZESTRIL) 40 MG tablet Take 1 tablet (40 mg total) by mouth daily.   spironolactone (ALDACTONE) 25 MG tablet Take 25 mg by mouth daily.   [DISCONTINUED] cetirizine (ZYRTEC) 5 MG tablet Take 1 tablet (5 mg total) by mouth daily. (Patient not taking: Reported on 01/16/2022)   [DISCONTINUED] clotrimazole-betamethasone (LOTRISONE) cream Apply 1 application topically daily. (Patient not taking: Reported on 01/16/2022)   No facility-administered encounter medications on file as of 07/17/2022.    Past Medical History:  Diagnosis Date   Bronchospasm 01/09/2013   DUB (dysfunctional uterine bleeding)    GERD (gastroesophageal reflux disease)    otc tums   History of blood transfusion 2011   "related to fibrods"   Hypertension    Pneumonia ~ 2015; 04/29/2017   Recurrent UTI, h/o urethra diverticuli 10/13/2009   Qualifier: Diagnosis of  By: Stanford Scotland MD, [name]     Urethral diverticulum    Dr. Estill Dooms   Yeast vaginitis    Dr. Estill Dooms    Past Surgical History:  Procedure Laterality Date   BILATERAL SALPINGECTOMY  03/07/2012   Procedure: BILATERAL SALPINGECTOMY;  Surgeon: Lavonia Drafts, MD;  Location: [facility];  Service: Gynecology;  Laterality: Bilateral;   CYSTOSCOPY  03/07/2012   Procedure: CYSTOSCOPY;   Surgeon: Lavonia Drafts, MD;  Location: [facility] [place] [facility];  Service: Gynecology;  Laterality: N/A;   LASER ABLATION     "before they took my uterus out"   ROBOTIC ASSISTED TOTAL HYSTERECTOMY  03/07/2012   Procedure: ROBOTIC ASSISTED TOTAL HYSTERECTOMY;  Surgeon: Lavonia Drafts, MD;  Location: [facility];  Service: Gynecology;  Laterality: N/A;   TUBAL LIGATION     URETHRAL DIVERTICULECTOMY     VIDEO BRONCHOSCOPY Bilateral 05/01/2017   Procedure: VIDEO BRONCHOSCOPY WITH FLUORO;  Surgeon: Marshell Garfinkel, MD;  Location: [facility];  Service: Cardiopulmonary;  Laterality: Bilateral;    Family History  Problem Relation Age of Onset   Hypertension Mother    Arthritis Mother    Anemia Mother    Cancer Mother        uterine?   Colon cancer Mother 26   Diabetes Father    Stroke Father 88   Heart disease Father        has a defib   Hypertension Father    Heart failure Father    Heart disease Brother    Heart failure Brother    Leukemia Maternal Grandmother    Breast cancer Neg Hx    Esophageal cancer Neg Hx    Stomach cancer Neg Hx    Rectal cancer Neg Hx    Colon polyps Neg Hx     Social History   Socioeconomic History   Marital status: Divorced    Spouse name:  Not on file   Number of children: 3   Years of education: Not on file   Highest education level: Not on file  Occupational History   Occupation: bus driver    Employer: [employer]  Tobacco Use   Smoking status: Some Days    Packs/day: 1.00    Years: 36.00    Additional pack years: 0.00    Total pack years: 36.00    Types: Cigarettes    Start date: 06/21/1986    Passive exposure: Current   Smokeless tobacco: Never   Tobacco comments:    1 pack/day  Vaping Use   Vaping Use: Never used  Substance and Sexual Activity   Alcohol use: No    Alcohol/week: 0.0 standard drinks of alcohol   Drug use: No   Sexual activity: Not Currently    Birth control/protection: Surgical  Other Topics Concern   Not  on file  Social History Narrative   Household: pt and 2 children Regular exercise: was; not been in 1 mth Caffeine use: coffee daily   Social Determinants of Radio broadcast assistant Strain: Not on file  Food Insecurity: Not on file  Transportation Needs: Not on file  Physical Activity: Not on file  Stress: Not on file  Social Connections: Not on file  Intimate Partner Violence: Not on file    ROS      Objective    BP (!) 129/91   Pulse 88   Temp 98.7 F (37.1 C) (Oral)   Resp 16   Wt 219 lb 3.2 oz (99.4 kg)   LMP 02/29/2012 (Exact Date)   SpO2 96%   BMI 37.63 kg/m   Physical Exam  {Labs (Optional):23779}    Assessment & Plan:   Problem List Items Addressed This Visit   None   No follow-ups on file.   Becky Sax, MD

## 2022-08-24 ENCOUNTER — Other Ambulatory Visit: Payer: Self-pay | Admitting: Family Medicine

## 2022-08-24 DIAGNOSIS — L299 Pruritus, unspecified: Secondary | ICD-10-CM

## 2022-08-25 NOTE — Telephone Encounter (Signed)
Requested Prescriptions  Pending Prescriptions Disp Refills   hydrOXYzine (ATARAX) 10 MG tablet [Pharmacy Med Name: hydrOXYzine HCl 10 MG Oral Tablet] 270 tablet 0    Sig: Take 1 tablet by mouth three times daily as needed     Ear, Nose, and Throat:  Antihistamines 2 Failed - 08/24/2022  9:23 PM      Failed - Cr in normal range and within 360 days    Creatinine, Ser  Date Value Ref Range Status  08/12/2021 1.61 (H) 0.57 - 1.00 mg/dL Final         Passed - Valid encounter within last 12 months    Recent Outpatient Visits           1 month ago Essential hypertension   [place] [facility], MD   7 months ago Essential hypertension   [place] [facility], MD   1 year ago Pruritus   [place] [facility] [place], NP   1 year ago Pruritus   [facility], Gildardo Pounds, NP   1 year ago Pruritus   [facility] at [facility], Gildardo Pounds, NP       Future Appointments             In 1 month Georganna Skeans, MD [facility] at [facility]

## 2022-10-18 ENCOUNTER — Encounter: Payer: Self-pay | Admitting: Family Medicine

## 2022-10-18 ENCOUNTER — Ambulatory Visit: Payer: BC Managed Care – PPO | Admitting: Family Medicine

## 2022-10-18 VITALS — BP 105/73 | HR 94 | Resp 16 | Wt 218.0 lb

## 2022-10-18 DIAGNOSIS — E785 Hyperlipidemia, unspecified: Secondary | ICD-10-CM | POA: Diagnosis not present

## 2022-10-18 DIAGNOSIS — N3946 Mixed incontinence: Secondary | ICD-10-CM

## 2022-10-18 DIAGNOSIS — I10 Essential (primary) hypertension: Secondary | ICD-10-CM | POA: Diagnosis not present

## 2022-10-18 DIAGNOSIS — R252 Cramp and spasm: Secondary | ICD-10-CM

## 2022-10-18 MED ORDER — OXYBUTYNIN CHLORIDE ER 5 MG PO TB24: 5.0000 mg | ORAL_TABLET | Freq: Every day | ORAL | 0 refills | Status: AC

## 2022-10-18 NOTE — Progress Notes (Signed)
Patient is here for their 3 month follow-up Patient has no concerns today Care gaps have been discussed with patient  

## 2022-10-19 LAB — BASIC METABOLIC PANEL
BUN/Creatinine Ratio: 15 (ref 9–23)
BUN: 27 mg/dL — ABNORMAL HIGH (ref 6–24)
CO2: 19 mmol/L — ABNORMAL LOW (ref 20–29)
Calcium: 10 mg/dL (ref 8.7–10.2)
Chloride: 103 mmol/L (ref 96–106)
Creatinine, Ser: 1.75 mg/dL — ABNORMAL HIGH (ref 0.57–1.00)
Glucose: 78 mg/dL (ref 70–99)
Potassium: 4.7 mmol/L (ref 3.5–5.2)
Sodium: 139 mmol/L (ref 134–144)
eGFR: 34 mL/min/{1.73_m2} — ABNORMAL LOW (ref >59)

## 2022-10-19 LAB — LIPID PANEL
Chol/HDL Ratio: 5.2 ratio — ABNORMAL HIGH (ref 0.0–4.4)
Cholesterol, Total: 208 mg/dL — ABNORMAL HIGH (ref 100–199)
HDL: 40 mg/dL (ref >39)
LDL Chol Calc (NIH): 132 mg/dL — ABNORMAL HIGH (ref 0–99)
Triglycerides: 204 mg/dL — ABNORMAL HIGH (ref 0–149)
VLDL Cholesterol Cal: 36 mg/dL (ref 5–40)

## 2022-10-23 ENCOUNTER — Encounter: Payer: Self-pay | Admitting: Family Medicine

## 2022-10-23 NOTE — Progress Notes (Signed)
Established Patient Office Visit  Subjective    Patient ID: Kristin Burnett, female    DOB: 24-Aug-1965  Age: 57 y.o. MRN: [id number]  CC:  Chief Complaint  Patient presents with   Hypertension   Follow-up    HPI Kristin Burnett presents for follow up of chronic med issues. Patient denies acute complaints.    Outpatient Encounter Medications as of 10/18/2022  Medication Sig   amLODipine (NORVASC) 10 MG tablet Take 1 tablet (10 mg total) by mouth daily.   atorvastatin (LIPITOR) 20 MG tablet Take 1 tablet (20 mg total) by mouth daily.   fluticasone (FLONASE) 50 MCG/ACT nasal spray Place 2 sprays into both nostrils daily.   hydrOXYzine (ATARAX) 10 MG tablet Take 1 tablet by mouth three times daily as needed   lisinopril (ZESTRIL) 40 MG tablet Take 1 tablet (40 mg total) by mouth daily.   oxybutynin (DITROPAN XL) 5 MG 24 hr tablet Take 1 tablet (5 mg total) by mouth at bedtime.   spironolactone (ALDACTONE) 25 MG tablet Take 1 tablet (25 mg total) by mouth daily.   No facility-administered encounter medications on file as of 10/18/2022.    Past Medical History:  Diagnosis Date   Bronchospasm 01/09/2013   DUB (dysfunctional uterine bleeding)    GERD (gastroesophageal reflux disease)    otc tums   History of blood transfusion 2011   "related to fibrods"   Hypertension    Pneumonia ~ 2015; 04/29/2017   Recurrent UTI, h/o urethra diverticuli 10/13/2009   Qualifier: Diagnosis of  By: [name] MD, [name]     Urethral diverticulum    Dr. Lindley Magnus   Yeast vaginitis    Dr. Lindley Magnus    Past Surgical History:  Procedure Laterality Date   BILATERAL SALPINGECTOMY  03/07/2012   Procedure: BILATERAL SALPINGECTOMY;  Surgeon: [name], MD;  Location: [facility];  Service: Gynecology;  Laterality: Bilateral;   CYSTOSCOPY  03/07/2012   Procedure: CYSTOSCOPY;  Surgeon: [name], MD;  Location: [facility];  Service: Gynecology;  Laterality: N/A;   LASER ABLATION     "before they  took my uterus out"   ROBOTIC ASSISTED TOTAL HYSTERECTOMY  03/07/2012   Procedure: ROBOTIC ASSISTED TOTAL HYSTERECTOMY;  Surgeon: [name], MD;  Location: [facility];  Service: Gynecology;  Laterality: N/A;   TUBAL LIGATION     URETHRAL DIVERTICULECTOMY     VIDEO BRONCHOSCOPY Bilateral 05/01/2017   Procedure: VIDEO BRONCHOSCOPY WITH FLUORO;  Surgeon: [name], MD;  Location: [facility];  Service: Cardiopulmonary;  Laterality: Bilateral;    Family History  Problem Relation Age of Onset   Hypertension Mother    Arthritis Mother    Anemia Mother    Cancer Mother        uterine?   Colon cancer Mother 59   Diabetes Father    Stroke Father 68   Heart disease Father        has a defib   Hypertension Father    Heart failure Father    Heart disease Brother    Heart failure Brother    Leukemia Maternal Grandmother    Breast cancer Neg Hx    Esophageal cancer Neg Hx    Stomach cancer Neg Hx    Rectal cancer Neg Hx    Colon polyps Neg Hx     Social History   Socioeconomic History   Marital status: Divorced    Spouse name: Not on file   Number of children: 3   Years  of education: Not on file   Highest education level: Not on file  Occupational History   Occupation: bus driver    Employer: [employer]  Tobacco Use   Smoking status: Some Days    Packs/day: 1.00    Years: 36.00    Additional pack years: 0.00    Total pack years: 36.00    Types: Cigarettes    Start date: 06/21/1986    Passive exposure: Current   Smokeless tobacco: Never   Tobacco comments:    1 pack/day  Vaping Use   Vaping Use: Never used  Substance and Sexual Activity   Alcohol use: No    Alcohol/week: 0.0 standard drinks of alcohol   Drug use: No   Sexual activity: Not Currently    Birth control/protection: Surgical  Other Topics Concern   Not on file  Social History Narrative   Household: pt and 2 children Regular exercise: was; not been in 1 mth Caffeine use: coffee  daily   Social Determinants of Health   Financial Resource Strain: Not on file  Food Insecurity: Not on file  Transportation Needs: Not on file  Physical Activity: Not on file  Stress: Not on file  Social Connections: Not on file  Intimate Partner Violence: Not on file    Review of Systems  All other systems reviewed and are negative.       Objective    BP 105/73   Pulse 94   Resp 16   Wt 218 lb (98.9 kg)   LMP 02/29/2012 (Exact Date)   SpO2 96%   BMI 37.42 kg/m   Physical Exam Vitals and nursing note reviewed.  Constitutional:      General: She is not in acute distress.    Appearance: She is obese.  Cardiovascular:     Rate and Rhythm: Normal rate and regular rhythm.  Pulmonary:     Effort: Pulmonary effort is normal.     Breath sounds: Normal breath sounds.  Abdominal:     Palpations: Abdomen is soft.     Tenderness: There is no abdominal tenderness.  Musculoskeletal:     Right lower leg: No edema.     Left lower leg: No edema.  Neurological:     General: No focal deficit present.     Mental Status: She is alert and oriented to person, place, and time.         Assessment & Plan:   1. Essential hypertension Appears stable. Monitoring labs ordered - Basic Metabolic Panel - Lipid Panel  2. Hyperlipidemia, unspecified hyperlipidemia type Continue   3. Mixed stress and urge urinary incontinence Ditropan prescribed  4. Leg cramps  - Basic Metabolic Panel    No follow-ups on file.   Kristin Raymond, MD

## 2022-12-15 ENCOUNTER — Ambulatory Visit: Payer: BC Managed Care – PPO

## 2022-12-15 ENCOUNTER — Ambulatory Visit
Admission: RE | Admit: 2022-12-15 | Discharge: 2022-12-15 | Disposition: A | Discharge status: Home / Self Care | Payer: BC Managed Care – PPO | Source: Ambulatory Visit

## 2022-12-15 ENCOUNTER — Other Ambulatory Visit: Payer: Self-pay

## 2022-12-15 VITALS — BP 141/94 | HR 89 | Temp 98.3°F | Resp 18

## 2022-12-15 DIAGNOSIS — M7918 Myalgia, other site: Secondary | ICD-10-CM

## 2022-12-15 DIAGNOSIS — M79605 Pain in left leg: Secondary | ICD-10-CM

## 2022-12-15 DIAGNOSIS — W19XXXA Unspecified fall, initial encounter: Secondary | ICD-10-CM

## 2022-12-15 MED ORDER — METHOCARBAMOL 500 MG PO TABS: 500.0000 mg | ORAL_TABLET | Freq: Two times a day (BID) | ORAL | 0 refills | Status: AC | PRN

## 2022-12-15 NOTE — Discharge Instructions (Addendum)
X-rays were normal.  I have prescribed you a muscle relaxer to take as needed.  Please be advised that it can make you drowsy so no driving or drinking alcohol with it.  Apply ice to affected areas and follow-up with orthopedist if symptoms persist or worsen.

## 2022-12-15 NOTE — ED Provider Notes (Signed)
[facility]    CSN: [id number] Arrival date & time: 12/15/22  1804      History   Chief Complaint Chief Complaint  Patient presents with   Fall    HPI Kristin Burnett is a 57 y.o. female.   Patient presents for further evaluation after a fall that occurred yesterday while shopping at the grocery store.  Reports that she got her foot stuck under a pallet causing her to subsequently fall backwards with left buttock/leg landing on the palate.  Denies hitting head or losing consciousness.  She took Advil yesterday for pain with minimal improvement.  Denies numbness or tingling.   Fall    Past Medical History:  Diagnosis Date   Bronchospasm 01/09/2013   DUB (dysfunctional uterine bleeding)    GERD (gastroesophageal reflux disease)    otc tums   History of blood transfusion 2011   "related to fibrods"   Hypertension    Pneumonia ~ 2015; 04/29/2017   Recurrent UTI, h/o urethra diverticuli 10/13/2009   Qualifier: Diagnosis of  By: [name] MD, [name]     Urethral diverticulum    Dr. Lindley Magnus   Yeast vaginitis    Dr. Lindley Magnus    Patient Active Problem List   Diagnosis Date Noted   Pruritus 04/28/2021   Upper respiratory tract infection 03/10/2021   Fever, unspecified 03/10/2021   Hyperlipidemia 07/03/2019   CKD (chronic kidney disease) 07/03/2019   Tachypnea 04/29/2017   Urethra, diverticulum 07/06/2014   FOM (frequency of micturition) 07/06/2014   Anxiety and depression 03/05/2013   Bronchospasm 01/09/2013   Annual physical exam 09/19/2012   Edema of   legs, L>R 09/19/2012   GERD (gastroesophageal reflux disease) 09/19/2012   LEIOMYOMA OF UTERUS, UNSPECIFIED 10/13/2009   ANEMIA, HYPOCHROMIC 10/13/2009   Essential hypertension, benign 10/13/2009   Recurrent UTI, h/o urethra diverticuli 10/13/2009   DYSFUNCTIONAL UTERINE BLEEDING 10/13/2009    Past Surgical History:  Procedure Laterality Date   BILATERAL SALPINGECTOMY  03/07/2012   Procedure: BILATERAL  SALPINGECTOMY;  Surgeon: [name], MD;  Location: [facility];  Service: Gynecology;  Laterality: Bilateral;   CYSTOSCOPY  03/07/2012   Procedure: CYSTOSCOPY;  Surgeon: [name], MD;  Location: [facility];  Service: Gynecology;  Laterality: N/A;   LASER ABLATION     "before they took my uterus out"   ROBOTIC ASSISTED TOTAL HYSTERECTOMY  03/07/2012   Procedure: ROBOTIC ASSISTED TOTAL HYSTERECTOMY;  Surgeon: [name], MD;  Location: [facility];  Service: Gynecology;  Laterality: N/A;   TUBAL LIGATION     URETHRAL DIVERTICULECTOMY     VIDEO BRONCHOSCOPY Bilateral 05/01/2017   Procedure: VIDEO BRONCHOSCOPY WITH FLUORO;  Surgeon: [name], MD;  Location: [facility];  Service: Cardiopulmonary;  Laterality: Bilateral;    OB History     Gravida  3   Para  3   Term  3   Preterm      AB      Living  3      SAB      IAB      Ectopic      Multiple      Live Births               Home Medications    Prior to Admission medications   Medication Sig Start Date End Date Taking? Authorizing Provider  methocarbamol (ROBAXIN) 500 MG tablet Take 1 tablet (500 mg total) by mouth 2 (two) times daily as needed for muscle spasms. 12/15/22  Yes Melaysia Streed,  Acie Fredrickson, FNP  amLODipine (NORVASC) 10 MG tablet Take 1 tablet (10 mg total) by mouth daily. 07/17/22 11/14/22  Georganna Skeans, MD  atorvastatin (LIPITOR) 20 MG tablet Take 1 tablet (20 mg total) by mouth daily. 07/17/22   Georganna Skeans, MD  fluticasone [facility]) 50 MCG/ACT nasal spray Place 2 sprays into both nostrils daily. 08/12/21   Rema Fendt, NP  hydrOXYzine (ATARAX) 10 MG tablet Take 1 tablet by mouth three times daily as needed 08/25/22   Georganna Skeans, MD  lisinopril (ZESTRIL) 40 MG tablet Take 1 tablet (40 mg total) by mouth daily. 07/17/22   Georganna Skeans, MD  oxybutynin (DITROPAN XL) 5 MG 24 hr tablet Take 1 tablet (5 mg total) by mouth at bedtime. 10/18/22   Georganna Skeans, MD  spironolactone  (ALDACTONE) 25 MG tablet Take 1 tablet (25 mg total) by mouth daily. 07/17/22   Georganna Skeans, MD    Family History Family History  Problem Relation Age of Onset   Hypertension Mother    Arthritis Mother    Anemia Mother    Cancer Mother        uterine?   Colon cancer Mother 63   Diabetes Father    Stroke Father 79   Heart disease Father        has a defib   Hypertension Father    Heart failure Father    Heart disease Brother    Heart failure Brother    Leukemia Maternal Grandmother    Breast cancer Neg Hx    Esophageal cancer Neg Hx    Stomach cancer Neg Hx    Rectal cancer Neg Hx    Colon polyps Neg Hx     Social History Social History   Tobacco Use   Smoking status: Some Days    Current packs/day: 1.00    Average packs/day: 1 pack/day for 36.5 years (36.5 ttl pk-yrs)    Types: Cigarettes    Start date: 06/21/1986    Passive exposure: Current   Smokeless tobacco: Never   Tobacco comments:    1 pack/day  Vaping Use   Vaping status: Never Used  Substance Use Topics   Alcohol use: No    Alcohol/week: 0.0 standard drinks of alcohol   Drug use: No     Allergies   Patient has no known allergies.   Review of Systems Review of Systems Per HPI  Physical Exam Triage Vital Signs ED Triage Vitals [12/15/22 1827]  Encounter Vitals Group     BP (!) 141/94     Systolic BP Percentile      Diastolic BP Percentile      Pulse Rate 89     Resp 18     Temp 98.3 F (36.8 C)     Temp Source Oral     SpO2 96 %     Weight      Height      Head Circumference      Peak Flow      Pain Score 6     Pain Loc      Pain Education      Exclude from Growth Chart    No data found.  Updated Vital Signs BP (!) 141/94 (BP Location: Left Arm)   Pulse 89   Temp 98.3 F (36.8 C) (Oral)   Resp 18   LMP 02/29/2012 (Exact Date)   SpO2 96%   Visual Acuity Right Eye Distance:   Left Eye Distance:   Bilateral Distance:  Right Eye Near:   Left Eye Near:     Bilateral Near:     Physical Exam Exam conducted with a chaperone present.  Constitutional:      General: She is not in acute distress.    Appearance: Normal appearance. She is not toxic-appearing or diaphoretic.  HENT:     Head: Normocephalic and atraumatic.  Eyes:     Extraocular Movements: Extraocular movements intact.     Conjunctiva/sclera: Conjunctivae normal.  Pulmonary:     Effort: Pulmonary effort is normal.  Musculoskeletal:     Comments: Tenderness to palpation to left mid buttocks that extends down into the posterior thigh.  No obvious bruising, discoloration, lacerations, abrasions noted.  Patient is able to ambulate and bear weight. Appears neurovascularly intact.   Neurological:     General: No focal deficit present.     Mental Status: She is alert and oriented to person, place, and time. Mental status is at baseline.  Psychiatric:        Mood and Affect: Mood normal.        Behavior: Behavior normal.        Thought Content: Thought content normal.        Judgment: Judgment normal.      UC Treatments / Results  Labs (all labs ordered are listed, but only abnormal results are displayed) Labs Reviewed - No data to display  EKG   Radiology DG Hip Unilat With Pelvis 2-3 Views Left  Result Date: 12/15/2022 CLINICAL DATA:  Fall yesterday. Left buttock pain radiating to left leg. EXAM: DG HIP (WITH OR WITHOUT PELVIS) 2-3V LEFT; LEFT FEMUR 2 VIEWS COMPARISON:  None Available. FINDINGS: Left hip: No acute fracture. Femoral head is well seated, hip dislocation. The pubic rami are intact. Pubic symphysis and sacroiliac joints are congruent. No disruption of sacral ala. Unremarkable soft tissues. Left femur: No fracture. Cortical margins are intact. Knee alignment is maintained. No erosive change or focal bone abnormality. Unremarkable soft tissues. IMPRESSION: No acute fracture of the pelvis, left hip or left femur. Electronically Signed   By: Narda Rutherford M.D.   On:  12/15/2022 19:22   DG Femur Min 2 Views Left  Result Date: 12/15/2022 CLINICAL DATA:  Fall yesterday. Left buttock pain radiating to left leg. EXAM: DG HIP (WITH OR WITHOUT PELVIS) 2-3V LEFT; LEFT FEMUR 2 VIEWS COMPARISON:  None Available. FINDINGS: Left hip: No acute fracture. Femoral head is well seated, hip dislocation. The pubic rami are intact. Pubic symphysis and sacroiliac joints are congruent. No disruption of sacral ala. Unremarkable soft tissues. Left femur: No fracture. Cortical margins are intact. Knee alignment is maintained. No erosive change or focal bone abnormality. Unremarkable soft tissues. IMPRESSION: No acute fracture of the pelvis, left hip or left femur. Electronically Signed   By: Narda Rutherford M.D.   On: 12/15/2022 19:22    Procedures Procedures (including critical care time)  Medications Ordered in UC Medications - No data to display  Initial Impression / Assessment and Plan / UC Course  I have reviewed the triage vital signs and the nursing notes.  Pertinent labs & imaging results that were available during my care of the patient were reviewed by me and considered in my medical decision making (see chart for details).     X-rays were negative for any acute bony abnormality.  Suspect contusion versus muscular strain given mechanism of injury.  Will treat with muscle relaxer to take as needed.  Advised patient this can make  her drowsy and do not drive or drink alcohol with taking it.  Advised supportive care including ice application.  Advised following up with orthopedist at provided contact information if symptoms persist or worsen.  Patient verbalized understanding and was agreeable with plan. Final Clinical Impressions(s) / UC Diagnoses   Final diagnoses:  Fall, initial encounter  Left buttock pain  Left leg pain     Discharge Instructions      X-rays were normal.  I have prescribed you a muscle relaxer to take as needed.  Please be advised that it can  make you drowsy so no driving or drinking alcohol with it.  Apply ice to affected areas and follow-up with orthopedist if symptoms persist or worsen.     ED Prescriptions     Medication Sig Dispense Auth. Provider   methocarbamol (ROBAXIN) 500 MG tablet Take 1 tablet (500 mg total) by mouth 2 (two) times daily as needed for muscle spasms. 20 tablet [place], Acie Fredrickson, [place]      PDMP not reviewed this encounter.   Gustavus Bryant, [place] 12/15/22 [phone]

## 2022-12-15 NOTE — ED Triage Notes (Signed)
Pt sts fall yesterday while shopping where the cart fell on top of here as well; pt sts left sided buttocks pain with radiation to left leg

## 2023-04-14 ENCOUNTER — Other Ambulatory Visit: Payer: Self-pay | Admitting: Family Medicine

## 2023-04-14 DIAGNOSIS — L299 Pruritus, unspecified: Secondary | ICD-10-CM

## 2023-04-19 ENCOUNTER — Encounter: Payer: Self-pay | Admitting: Family Medicine

## 2023-04-19 ENCOUNTER — Ambulatory Visit (INDEPENDENT_AMBULATORY_CARE_PROVIDER_SITE_OTHER): Payer: BC Managed Care – PPO | Admitting: Family Medicine

## 2023-04-19 VITALS — BP 154/98 | HR 88 | Temp 98.3°F | Resp 18 | Ht 64.0 in | Wt 208.6 lb

## 2023-04-19 DIAGNOSIS — E785 Hyperlipidemia, unspecified: Secondary | ICD-10-CM

## 2023-04-19 DIAGNOSIS — I10 Essential (primary) hypertension: Secondary | ICD-10-CM

## 2023-04-19 DIAGNOSIS — L299 Pruritus, unspecified: Secondary | ICD-10-CM | POA: Diagnosis not present

## 2023-04-19 MED ORDER — LISINOPRIL 40 MG PO TABS: 40.0000 mg | ORAL_TABLET | Freq: Every day | ORAL | 1 refills | Status: AC

## 2023-04-19 MED ORDER — HYDROXYZINE HCL 10 MG PO TABS: 10.0000 mg | ORAL_TABLET | Freq: Three times a day (TID) | ORAL | 1 refills | Status: DC | PRN

## 2023-04-19 MED ORDER — ATORVASTATIN CALCIUM 20 MG PO TABS: 20.0000 mg | ORAL_TABLET | Freq: Every day | ORAL | 1 refills | Status: AC

## 2023-04-19 MED ORDER — AMLODIPINE BESYLATE 10 MG PO TABS: 10.0000 mg | ORAL_TABLET | Freq: Every day | ORAL | 1 refills | Status: AC

## 2023-04-19 MED ORDER — SPIRONOLACTONE 25 MG PO TABS: 25.0000 mg | ORAL_TABLET | Freq: Every day | ORAL | 1 refills | Status: AC

## 2023-04-19 NOTE — Progress Notes (Signed)
Established Patient Office Visit  Subjective    Patient ID: Kristin Burnett, female    DOB: 12/24/1965  Age: 57 y.o. MRN: [id number]  CC:  Chief Complaint  Patient presents with   Follow-up    6 month    HPI Kristin Burnett presents for routine follow up of hypertension. Patient reports that she was unable to get one of her bp meds from the pharmacy and has been out for about 1 week or so. She denies acute complaints.   Outpatient Encounter Medications as of 04/19/2023  Medication Sig   fluticasone (FLONASE) 50 MCG/ACT nasal spray Place 2 sprays into both nostrils daily.   methocarbamol (ROBAXIN) 500 MG tablet Take 1 tablet (500 mg total) by mouth 2 (two) times daily as needed for muscle spasms.   oxybutynin (DITROPAN XL) 5 MG 24 hr tablet Take 1 tablet (5 mg total) by mouth at bedtime.   [DISCONTINUED] atorvastatin (LIPITOR) 20 MG tablet Take 1 tablet (20 mg total) by mouth daily.   [DISCONTINUED] hydrOXYzine (ATARAX) 10 MG tablet Take 1 tablet by mouth three times daily as needed   [DISCONTINUED] lisinopril (ZESTRIL) 40 MG tablet Take 1 tablet (40 mg total) by mouth daily.   [DISCONTINUED] spironolactone (ALDACTONE) 25 MG tablet Take 1 tablet (25 mg total) by mouth daily.   amLODipine (NORVASC) 10 MG tablet Take 1 tablet (10 mg total) by mouth daily.   atorvastatin (LIPITOR) 20 MG tablet Take 1 tablet (20 mg total) by mouth daily.   hydrOXYzine (ATARAX) 10 MG tablet Take 1 tablet (10 mg total) by mouth 3 (three) times daily as needed.   lisinopril (ZESTRIL) 40 MG tablet Take 1 tablet (40 mg total) by mouth daily.   spironolactone (ALDACTONE) 25 MG tablet Take 1 tablet (25 mg total) by mouth daily.   [DISCONTINUED] amLODipine (NORVASC) 10 MG tablet Take 1 tablet (10 mg total) by mouth daily.   No facility-administered encounter medications on file as of 04/19/2023.    Past Medical History:  Diagnosis Date   Bronchospasm 01/09/2013   DUB (dysfunctional uterine bleeding)    GERD  (gastroesophageal reflux disease)    otc tums   History of blood transfusion 2011   "related to fibrods"   Hypertension    Pneumonia ~ 2015; 04/29/2017   Recurrent UTI, h/o urethra diverticuli 10/13/2009   Qualifier: Diagnosis of  By: [name] MD, [name]     Urethral diverticulum    Dr. Lindley Magnus   Yeast vaginitis    Dr. Lindley Magnus    Past Surgical History:  Procedure Laterality Date   BILATERAL SALPINGECTOMY  03/07/2012   Procedure: BILATERAL SALPINGECTOMY;  Surgeon: [name], MD;  Location: [facility];  Service: Gynecology;  Laterality: Bilateral;   CYSTOSCOPY  03/07/2012   Procedure: CYSTOSCOPY;  Surgeon: [name], MD;  Location: [facility];  Service: Gynecology;  Laterality: N/A;   LASER ABLATION     "before they took my uterus out"   ROBOTIC ASSISTED TOTAL HYSTERECTOMY  03/07/2012   Procedure: ROBOTIC ASSISTED TOTAL HYSTERECTOMY;  Surgeon: [name], MD;  Location: [facility];  Service: Gynecology;  Laterality: N/A;   TUBAL LIGATION     URETHRAL DIVERTICULECTOMY     VIDEO BRONCHOSCOPY Bilateral 05/01/2017   Procedure: VIDEO BRONCHOSCOPY WITH FLUORO;  Surgeon: [name], MD;  Location: [facility];  Service: Cardiopulmonary;  Laterality: Bilateral;    Family History  Problem Relation Age of Onset   Hypertension Mother    Arthritis Mother    Anemia  Mother    Cancer Mother        uterine?   Colon cancer Mother 65   Diabetes Father    Stroke Father 32   Heart disease Father        has a defib   Hypertension Father    Heart failure Father    Heart disease Brother    Heart failure Brother    Leukemia Maternal Grandmother    Breast cancer Neg Hx    Esophageal cancer Neg Hx    Stomach cancer Neg Hx    Rectal cancer Neg Hx    Colon polyps Neg Hx     Social History   Socioeconomic History   Marital status: Divorced    Spouse name: Not on file   Number of children: 3   Years of education: Not on file   Highest education level: Not on file   Occupational History   Occupation: bus driver    Employer: [employer]  Tobacco Use   Smoking status: Some Days    Current packs/day: 1.00    Average packs/day: 1 pack/day for 36.8 years (36.8 ttl pk-yrs)    Types: Cigarettes    Start date: 06/21/1986    Passive exposure: Current   Smokeless tobacco: Never   Tobacco comments:    1 pack/day  Vaping Use   Vaping status: Never Used  Substance and Sexual Activity   Alcohol use: No    Alcohol/week: 0.0 standard drinks of alcohol   Drug use: No   Sexual activity: Not Currently    Birth control/protection: Surgical  Other Topics Concern   Not on file  Social History Narrative   Household: pt and 2 children Regular exercise: was; not been in 1 mth Caffeine use: coffee daily   Social Drivers of Corporate investment banker Strain: Low Risk  (04/19/2023)   Overall Financial Resource Strain (CARDIA)    Difficulty of Paying Living Expenses: Not hard at all  Food Insecurity: No Food Insecurity (04/19/2023)   Hunger Vital Sign    Worried About Running Out of Food in the Last Year: Never true    Ran Out of Food in the Last Year: Never true  Transportation Needs: No Transportation Needs (04/19/2023)   PRAPARE - Administrator, Civil Service (Medical): No    Lack of Transportation (Non-Medical): No  Physical Activity: Sufficiently Active (04/19/2023)   Exercise Vital Sign    Days of Exercise per Week: 5 days    Minutes of Exercise per Session: 30 min  Stress: No Stress Concern Present (04/19/2023)   [employer] of Occupational Health - Occupational Stress Questionnaire    Feeling of Stress : Not at all  Social Connections: Moderately Integrated (04/19/2023)   Social Connection and Isolation Panel [NHANES]    Frequency of Communication with Friends and Family: More than three times a week    Frequency of Social Gatherings with Friends and Family: Once a week    Attends Religious Services: More than 4 times  per year    Active Member of [employer] or Organizations: Yes    Attends Banker Meetings: More than 4 times per year    Marital Status: Divorced  Intimate Partner Violence: Not At Risk (04/19/2023)   Humiliation, Afraid, Rape, and Kick questionnaire    Fear of Current or Ex-Partner: No    Emotionally Abused: No    Physically Abused: No    Sexually Abused: No    Review of  Systems  All other systems reviewed and are negative.       Objective    BP (!) 167/106 (BP Location: Right Arm, Patient Position: Sitting, Cuff Size: Normal)   Pulse 88   Temp 98.3 F (36.8 C) (Oral)   Resp 18   Ht 5\' 4"  (1.626 m)   Wt 208 lb 9.6 oz (94.6 kg)   LMP 02/29/2012 (Exact Date)   SpO2 98%   BMI 35.81 kg/m   Physical Exam Vitals and nursing note reviewed.  Constitutional:      General: She is not in acute distress.    Appearance: She is obese.  Cardiovascular:     Rate and Rhythm: Normal rate and regular rhythm.  Pulmonary:     Effort: Pulmonary effort is normal.     Breath sounds: Normal breath sounds.  Abdominal:     Palpations: Abdomen is soft.     Tenderness: There is no abdominal tenderness.  Neurological:     General: No focal deficit present.     Mental Status: She is alert and oriented to person, place, and time.         Assessment & Plan:   Uncontrolled hypertension  Hyperlipidemia, unspecified hyperlipidemia type  Pruritus -     hydrOXYzine HCl; Take 1 tablet (10 mg total) by mouth 3 (three) times daily as needed.  Dispense: 270 tablet; Refill: 1  Other orders -     Spironolactone; Take 1 tablet (25 mg total) by mouth daily.  Dispense: 90 tablet; Refill: 1 -     Lisinopril; Take 1 tablet (40 mg total) by mouth daily.  Dispense: 90 tablet; Refill: 1 -     amLODIPine Besylate; Take 1 tablet (10 mg total) by mouth daily.  Dispense: 90 tablet; Refill: 1 -     Atorvastatin Calcium; Take 1 tablet (20 mg total) by mouth daily.  Dispense: 90 tablet; Refill:  1     Return in about 3 weeks (around 05/10/2023) for follow up.   Tommie Raymond, MD

## 2023-05-18 ENCOUNTER — Telehealth: Payer: Self-pay | Admitting: Family Medicine

## 2023-05-18 ENCOUNTER — Ambulatory Visit: Payer: BC Managed Care – PPO | Admitting: Family Medicine

## 2023-05-18 NOTE — Telephone Encounter (Signed)
Called pt and left vm to call office back to reschedule missed appt

## 2023-10-18 ENCOUNTER — Ambulatory Visit
Admission: EM | Admit: 2023-10-18 | Discharge: 2023-10-18 | Disposition: A | Discharge status: Home / Self Care | Payer: Self-pay | Attending: Physician Assistant | Admitting: Physician Assistant

## 2023-10-18 VITALS — BP 120/88 | HR 90 | Temp 97.7°F | Resp 18

## 2023-10-18 DIAGNOSIS — J019 Acute sinusitis, unspecified: Secondary | ICD-10-CM

## 2023-10-18 MED ORDER — AMOXICILLIN-POT CLAVULANATE 875-125 MG PO TABS: 1.0000 | ORAL_TABLET | Freq: Two times a day (BID) | ORAL | 0 refills | Status: AC

## 2023-10-18 NOTE — ED Provider Notes (Signed)
 [facility]    CSN: [id number] Arrival date & time: 10/18/23  1255      History   Chief Complaint Chief Complaint  Patient presents with   Nasal Congestion   Cough    HPI Kristin Burnett is a 58 y.o. female.   Patient here today for evaluation of nasal congestion, cough, low energy she has had for the last week.  She has not any fever or chills.  She has had sinus congestion and pressure.  She has tried Mucinex and Flonase  without resolution.  The history is provided by the patient.  Cough Associated symptoms: no chills, no ear pain, no eye discharge, no fever, no shortness of breath, no sore throat and no wheezing     Past Medical History:  Diagnosis Date   Bronchospasm 01/09/2013   DUB (dysfunctional uterine bleeding)    GERD (gastroesophageal reflux disease)    otc tums   History of blood transfusion 2011   related to fibrods   Hypertension    Pneumonia ~ 2015; 04/29/2017   Recurrent UTI, h/o urethra diverticuli 10/13/2009   Qualifier: Diagnosis of  By: Danella MD, [name]     Urethral diverticulum    Dr. Alfonzo   Yeast vaginitis    Dr. Alfonzo    Patient Active Problem List   Diagnosis Date Noted   Pruritus 04/28/2021   Upper respiratory tract infection 03/10/2021   Fever, unspecified 03/10/2021   Hyperlipidemia 07/03/2019   CKD (chronic kidney disease) 07/03/2019   Tachypnea 04/29/2017   Urethra, diverticulum 07/06/2014   FOM (frequency of micturition) 07/06/2014   Anxiety and depression 03/05/2013   Bronchospasm 01/09/2013   Annual physical exam 09/19/2012   Edema of   legs, L>R 09/19/2012   GERD (gastroesophageal reflux disease) 09/19/2012   LEIOMYOMA OF UTERUS, UNSPECIFIED 10/13/2009   ANEMIA, HYPOCHROMIC 10/13/2009   Essential hypertension, benign 10/13/2009   Recurrent UTI, h/o urethra diverticuli 10/13/2009   DYSFUNCTIONAL UTERINE BLEEDING 10/13/2009    Past Surgical History:  Procedure Laterality Date   BILATERAL SALPINGECTOMY   03/07/2012   Procedure: BILATERAL SALPINGECTOMY;  Surgeon: Elveria Mungo, MD;  Location: [facility];  Service: Gynecology;  Laterality: Bilateral;   CYSTOSCOPY  03/07/2012   Procedure: CYSTOSCOPY;  Surgeon: Elveria Mungo, MD;  Location: [facility];  Service: Gynecology;  Laterality: N/A;   LASER ABLATION     before they took my uterus out   ROBOTIC ASSISTED TOTAL HYSTERECTOMY  03/07/2012   Procedure: ROBOTIC ASSISTED TOTAL HYSTERECTOMY;  Surgeon: Elveria Mungo, MD;  Location: [facility];  Service: Gynecology;  Laterality: N/A;   TUBAL LIGATION     URETHRAL DIVERTICULECTOMY     VIDEO BRONCHOSCOPY Bilateral 05/01/2017   Procedure: VIDEO BRONCHOSCOPY WITH FLUORO;  Surgeon: Mannam, Praveen, MD;  Location: [facility];  Service: Cardiopulmonary;  Laterality: Bilateral;    OB History     Gravida  3   Para  3   Term  3   Preterm      AB      Living  3      SAB      IAB      Ectopic      Multiple      Live Births               Home Medications    Prior to Admission medications   Medication Sig Start Date End Date Taking? Authorizing Provider  amLODipine  (NORVASC ) 10 MG tablet Take 1 tablet (10 mg total) by  mouth daily. 04/19/23 10/18/23 Yes Tanda Bleacher, MD  amoxicillin -clavulanate (AUGMENTIN ) 875-125 MG tablet Take 1 tablet by mouth every 12 (twelve) hours. 10/18/23  Yes Billy Asberry FALCON, PA-C  atorvastatin  (LIPITOR) 20 MG tablet Take 1 tablet (20 mg total) by mouth daily. 04/19/23  Yes Tanda Bleacher, MD  fluticasone  (FLONASE ) 50 MCG/ACT nasal spray Place 2 sprays into both nostrils daily. 08/12/21  Yes Lorren, Amy J, NP  hydrOXYzine  (ATARAX ) 10 MG tablet Take 1 tablet (10 mg total) by mouth 3 (three) times daily as needed. 04/19/23  Yes Tanda Bleacher, MD  lisinopril  (ZESTRIL ) 40 MG tablet Take 1 tablet (40 mg total) by mouth daily. 04/19/23  Yes Tanda Bleacher, MD  spironolactone  (ALDACTONE ) 25 MG tablet Take 1 tablet (25 mg total) by mouth daily.  04/19/23  Yes Tanda Bleacher, MD  methocarbamol  (ROBAXIN ) 500 MG tablet Take 1 tablet (500 mg total) by mouth 2 (two) times daily as needed for muscle spasms. Patient not taking: Reported on 10/18/2023 12/15/22   Hazen Darryle BRAVO, FNP  oxybutynin  (DITROPAN  XL) 5 MG 24 hr tablet Take 1 tablet (5 mg total) by mouth at bedtime. Patient not taking: Reported on 10/18/2023 10/18/22   Tanda Bleacher, MD    Family History Family History  Problem Relation Age of Onset   Hypertension Mother    Arthritis Mother    Anemia Mother    Cancer Mother        uterine?   Colon cancer Mother 56   Diabetes Father    Stroke Father 52   Heart disease Father        has a defib   Hypertension Father    Heart failure Father    Heart disease Brother    Heart failure Brother    Leukemia Maternal Grandmother    Breast cancer Neg Hx    Esophageal cancer Neg Hx    Stomach cancer Neg Hx    Rectal cancer Neg Hx    Colon polyps Neg Hx     Social History Social History   Tobacco Use   Smoking status: Some Days    Current packs/day: 1.00    Average packs/day: 1 pack/day for 37.3 years (37.3 ttl pk-yrs)    Types: Cigarettes    Start date: 06/21/1986    Passive exposure: Current   Smokeless tobacco: Never   Tobacco comments:    1 pack/day  Vaping Use   Vaping status: Never Used  Substance Use Topics   Alcohol use: No    Alcohol/week: 0.0 standard drinks of alcohol   Drug use: No     Allergies   Patient has no known allergies.   Review of Systems Review of Systems  Constitutional:  Negative for chills and fever.  HENT:  Positive for congestion and sinus pressure. Negative for ear pain and sore throat.   Eyes:  Negative for discharge and redness.  Respiratory:  Positive for cough. Negative for shortness of breath and wheezing.   Gastrointestinal:  Negative for abdominal pain, diarrhea, nausea and vomiting.     Physical Exam Triage Vital Signs ED Triage Vitals [10/18/23 1319]  Encounter Vitals  Group     BP 120/88     Girls Systolic BP Percentile      Girls Diastolic BP Percentile      Boys Systolic BP Percentile      Boys Diastolic BP Percentile      Pulse Rate 90     Resp 18     Temp 97.7  F (36.5 C)     Temp Source Oral     SpO2 95 %     Weight      Height      Head Circumference      Peak Flow      Pain Score 0     Pain Loc      Pain Education      Exclude from Growth Chart    No data found.  Updated Vital Signs BP 120/88 (BP Location: Right Arm)   Pulse 90   Temp 97.7 F (36.5 C) (Oral)   Resp 18   LMP 02/29/2012 (Exact Date)   SpO2 95%   Visual Acuity Right Eye Distance:   Left Eye Distance:   Bilateral Distance:    Right Eye Near:   Left Eye Near:    Bilateral Near:     Physical Exam Vitals and nursing note reviewed.  Constitutional:      General: She is not in acute distress.    Appearance: Normal appearance. She is not ill-appearing.  HENT:     Head: Normocephalic and atraumatic.     Right Ear: Tympanic membrane normal.     Left Ear: Tympanic membrane normal.     Nose: Congestion present.     Mouth/Throat:     Mouth: Mucous membranes are moist.     Pharynx: No oropharyngeal exudate or posterior oropharyngeal erythema.   Eyes:     Conjunctiva/sclera: Conjunctivae normal.    Cardiovascular:     Rate and Rhythm: Normal rate and regular rhythm.     Heart sounds: Normal heart sounds. No murmur heard. Pulmonary:     Effort: Pulmonary effort is normal. No respiratory distress.     Breath sounds: Normal breath sounds. No wheezing, rhonchi or rales.   Skin:    General: Skin is warm and dry.   Neurological:     Mental Status: She is alert.   Psychiatric:        Mood and Affect: Mood normal.        Thought Content: Thought content normal.      UC Treatments / Results  Labs (all labs ordered are listed, but only abnormal results are displayed) Labs Reviewed - No data to display  EKG   Radiology No results  found.  Procedures Procedures (including critical care time)  Medications Ordered in UC Medications - No data to display  Initial Impression / Assessment and Plan / UC Course  I have reviewed the triage vital signs and the nursing notes.  Pertinent labs & imaging results that were available during my care of the patient were reviewed by me and considered in my medical decision making (see chart for details).    Will treat to cover sinusitis given duration of symptoms with Augmentin .  Advised follow-up if no gradual improvement or with any further concerns.  Final Clinical Impressions(s) / UC Diagnoses   Final diagnoses:  Acute non-recurrent sinusitis, unspecified location   Discharge Instructions   None    ED Prescriptions     Medication Sig Dispense Auth. Provider   amoxicillin -clavulanate (AUGMENTIN ) 875-125 MG tablet Take 1 tablet by mouth every 12 (twelve) hours. 14 tablet Billy Asberry FALCON, PA-C      PDMP not reviewed this encounter.   Billy Asberry FALCON, PA-C 10/21/23 1237

## 2023-10-18 NOTE — ED Triage Notes (Signed)
 Pt reports nasal congestion, productive cough, and low energy x1 week. Denies fevers or chills. Taking mucinex and flonase  with little relief.

## 2023-10-22 ENCOUNTER — Other Ambulatory Visit: Payer: Self-pay | Admitting: Family Medicine

## 2023-10-22 DIAGNOSIS — L299 Pruritus, unspecified: Secondary | ICD-10-CM

## 2023-11-01 ENCOUNTER — Other Ambulatory Visit: Payer: Self-pay | Admitting: Family Medicine

## 2023-11-01 DIAGNOSIS — L299 Pruritus, unspecified: Secondary | ICD-10-CM

## 2023-11-01 NOTE — Telephone Encounter (Signed)
 Copied from CRM [id number]. Topic: Clinical - Medication Refill >> Nov 01, 2023 10:50 AM Charlet HERO wrote: Medication: hydrOXYzine  (ATARAX ) 10 MG tablet  Has the patient contacted their pharmacy? Yes Called in to pharmacy it is showing discontinued with 1 refill  This is the patient's preferred pharmacy:    [facility] [address] [place], [place] - [address] [place] [address] Phone: [phone] Fax: [phone]  Is this the correct pharmacy for this prescription? Yes If no, delete pharmacy and type the correct one.   Has the prescription been filled recently? Yes  Is the patient out of the medication? Yes  Has the patient been seen for an appointment in the last year OR does the patient have an upcoming appointment? Yes  Can we respond through MyChart? Yes  Agent: Please be advised that Rx refills may take up to 3 business days. We ask that you follow-up with your pharmacy.

## 2023-11-13 ENCOUNTER — Ambulatory Visit: Payer: Self-pay

## 2023-11-13 NOTE — Telephone Encounter (Signed)
 FYI Only or Action Required?: FYI only for provider.  Patient was last seen in primary care on 04/19/2023 by Tanda Bleacher, MD.  Called Nurse Triage reporting Pruritis.  Symptoms began a year ago.  Interventions attempted: Prescription medications: hydroxyzine .  Symptoms are: gradually worsening.  Triage Disposition: See PCP Within 2 Weeks  Patient/caregiver understands and will follow disposition?: Yes                             Copied from CRM [id number]. Topic: Clinical - Red Word Triage >> Nov 13, 2023 10:38 AM Donna BRAVO wrote: Red Word that prompted transfer to Nurse Triage: Patient calling to schedule medication appt patient is itching all over patient is taking  hydrOXYzine  (ATARAX ) which in not helping very much. Itching is getting worse Patient would like to schedule appt, Reason for Disposition  Itching is a chronic symptom (recurrent or ongoing AND present > 4 weeks)  Answer Assessment - Initial Assessment Questions 1. DESCRIPTION: Describe the itching you are having.     Itching across legs, feet, arms and hands 2. SEVERITY: How bad is it?      States itching keeps her from sleeping 3. SCRATCHING: Are there any scratch marks? Bleeding?     Denies 4. ONSET: When did this begin? (e.g., minutes, hours, days ago)      A year or longer, worsening 5. CAUSE: What do you think is causing the itching? (ask about swimming pools, pollen, animals, soaps, etc.)     Unsure, has already gone to dermatology and could not determine a cause 6. OTHER SYMPTOMS: Do you have any other symptoms? (e.g., fever, rash)     Denies difficulty breathing, denies facial swelling, denies fever    Taking hydroxyzine , states medication no longer helps.  Protocols used: Itching - Widespread-A-AH

## 2023-11-13 NOTE — Telephone Encounter (Signed)
 Noted

## 2023-11-15 ENCOUNTER — Ambulatory Visit (INDEPENDENT_AMBULATORY_CARE_PROVIDER_SITE_OTHER): Payer: Self-pay | Admitting: Family Medicine

## 2023-11-15 VITALS — BP 149/97 | HR 80 | Ht 64.0 in | Wt 200.0 lb

## 2023-11-15 DIAGNOSIS — L299 Pruritus, unspecified: Secondary | ICD-10-CM | POA: Diagnosis not present

## 2023-11-15 DIAGNOSIS — F17200 Nicotine dependence, unspecified, uncomplicated: Secondary | ICD-10-CM

## 2023-11-15 DIAGNOSIS — I10 Essential (primary) hypertension: Secondary | ICD-10-CM | POA: Diagnosis not present

## 2023-11-15 DIAGNOSIS — J3089 Other allergic rhinitis: Secondary | ICD-10-CM | POA: Diagnosis not present

## 2023-11-15 MED ORDER — FLUTICASONE PROPIONATE 50 MCG/ACT NA SUSP: 2.0000 | Freq: Every day | NASAL | 1 refills | Status: AC

## 2023-11-15 MED ORDER — HYDROXYZINE HCL 10 MG PO TABS
10.0000 mg | ORAL_TABLET | Freq: Three times a day (TID) | ORAL | 0 refills | Status: AC | PRN
Start: 2023-11-15 — End: ?

## 2023-11-15 NOTE — Progress Notes (Unsigned)
 Established Patient Office Visit  Subjective    Patient ID: Kristin Burnett, female    DOB: 1966/03/19  Age: 58 y.o. MRN: [id number]  CC:  Chief Complaint  Patient presents with   itching    Itching on hands and feet, arms. No rash or anything visible.  Needs new flonase  prescription. Greig Drones, NP was last to refill it   Night Sweats    Pt would like to talk to Dr about night sweats     HPI Kristin Burnett presents for complaint of intense itching . She has seen specialist and treated for hives in the past. Patient denies known exposure or contact.   Outpatient Encounter Medications as of 11/15/2023  Medication Sig   amLODipine  (NORVASC ) 10 MG tablet Take 1 tablet (10 mg total) by mouth daily.   atorvastatin  (LIPITOR) 20 MG tablet Take 1 tablet (20 mg total) by mouth daily.   lisinopril  (ZESTRIL ) 40 MG tablet Take 1 tablet (40 mg total) by mouth daily.   spironolactone  (ALDACTONE ) 25 MG tablet Take 1 tablet (25 mg total) by mouth daily.   [DISCONTINUED] hydrOXYzine  (ATARAX ) 10 MG tablet Take 1 tablet by mouth three times daily as needed   amoxicillin -clavulanate (AUGMENTIN ) 875-125 MG tablet Take 1 tablet by mouth every 12 (twelve) hours. (Patient not taking: Reported on 11/15/2023)   fluticasone  (FLONASE ) 50 MCG/ACT nasal spray Place 2 sprays into both nostrils daily.   hydrOXYzine  (ATARAX ) 10 MG tablet Take 1 tablet (10 mg total) by mouth 3 (three) times daily as needed.   methocarbamol  (ROBAXIN ) 500 MG tablet Take 1 tablet (500 mg total) by mouth 2 (two) times daily as needed for muscle spasms. (Patient not taking: Reported on 10/18/2023)   oxybutynin  (DITROPAN  XL) 5 MG 24 hr tablet Take 1 tablet (5 mg total) by mouth at bedtime. (Patient not taking: Reported on 10/18/2023)   [DISCONTINUED] fluticasone  (FLONASE ) 50 MCG/ACT nasal spray Place 2 sprays into both nostrils daily.   No facility-administered encounter medications on file as of 11/15/2023.    Past Medical History:   Diagnosis Date   Bronchospasm 01/09/2013   DUB (dysfunctional uterine bleeding)    GERD (gastroesophageal reflux disease)    otc tums   History of blood transfusion 2011   related to fibrods   Hypertension    Pneumonia ~ 2015; 04/29/2017   Recurrent UTI, h/o urethra diverticuli 10/13/2009   Qualifier: Diagnosis of  By: Danella MD, [name]     Urethral diverticulum    Dr. Alfonzo   Yeast vaginitis    Dr. Alfonzo    Past Surgical History:  Procedure Laterality Date   BILATERAL SALPINGECTOMY  03/07/2012   Procedure: BILATERAL SALPINGECTOMY;  Surgeon: Elveria Mungo, MD;  Location: [facility];  Service: Gynecology;  Laterality: Bilateral;   CYSTOSCOPY  03/07/2012   Procedure: CYSTOSCOPY;  Surgeon: Elveria Mungo, MD;  Location: [facility];  Service: Gynecology;  Laterality: N/A;   LASER ABLATION     before they took my uterus out   ROBOTIC ASSISTED TOTAL HYSTERECTOMY  03/07/2012   Procedure: ROBOTIC ASSISTED TOTAL HYSTERECTOMY;  Surgeon: Elveria Mungo, MD;  Location: [facility];  Service: Gynecology;  Laterality: N/A;   TUBAL LIGATION     URETHRAL DIVERTICULECTOMY     VIDEO BRONCHOSCOPY Bilateral 05/01/2017   Procedure: VIDEO BRONCHOSCOPY WITH FLUORO;  Surgeon: Mannam, Praveen, MD;  Location: [facility];  Service: Cardiopulmonary;  Laterality: Bilateral;    Family History  Problem Relation Age of Onset   Hypertension Mother  Arthritis Mother    Anemia Mother    Cancer Mother        uterine?   Colon cancer Mother 22   Diabetes Father    Stroke Father 16   Heart disease Father        has a defib   Hypertension Father    Heart failure Father    Heart disease Brother    Heart failure Brother    Leukemia Maternal Grandmother    Breast cancer Neg Hx    Esophageal cancer Neg Hx    Stomach cancer Neg Hx    Rectal cancer Neg Hx    Colon polyps Neg Hx     Social History   Socioeconomic History   Marital status: Divorced    Spouse name: Not on file    Number of children: 3   Years of education: Not on file   Highest education level: Not on file  Occupational History   Occupation: bus driver    Employer: [employer]  Tobacco Use   Smoking status: Some Days    Current packs/day: 1.00    Average packs/day: 1 pack/day for 37.4 years (37.4 ttl pk-yrs)    Types: Cigarettes    Start date: 06/21/1986    Passive exposure: Current   Smokeless tobacco: Never   Tobacco comments:    1 pack/day  Vaping Use   Vaping status: Never Used  Substance and Sexual Activity   Alcohol use: No    Alcohol/week: 0.0 standard drinks of alcohol   Drug use: No   Sexual activity: Not Currently    Birth control/protection: Surgical  Other Topics Concern   Not on file  Social History Narrative   Household: pt and 2 children Regular exercise: was; not been in 1 mth Caffeine use: coffee daily   Social Drivers of Corporate investment banker Strain: Low Risk  (04/19/2023)   Overall Financial Resource Strain (CARDIA)    Difficulty of Paying Living Expenses: Not hard at all  Food Insecurity: No Food Insecurity (04/19/2023)   Hunger Vital Sign    Worried About Running Out of Food in the Last Year: Never true    Ran Out of Food in the Last Year: Never true  Transportation Needs: No Transportation Needs (04/19/2023)   PRAPARE - Administrator, Civil Service (Medical): No    Lack of Transportation (Non-Medical): No  Physical Activity: Sufficiently Active (04/19/2023)   Exercise Vital Sign    Days of Exercise per Week: 5 days    Minutes of Exercise per Session: 30 min  Stress: No Stress Concern Present (04/19/2023)   [employer] of Occupational Health - Occupational Stress Questionnaire    Feeling of Stress : Not at all  Social Connections: Moderately Integrated (04/19/2023)   Social Connection and Isolation Panel    Frequency of Communication with Friends and Family: More than three times a week    Frequency of Social Gatherings  with Friends and Family: Once a week    Attends Religious Services: More than 4 times per year    Active Member of [employer] or Organizations: Yes    Attends Banker Meetings: More than 4 times per year    Marital Status: Divorced  Intimate Partner Violence: Not At Risk (04/19/2023)   Humiliation, Afraid, Rape, and Kick questionnaire    Fear of Current or Ex-Partner: No    Emotionally Abused: No    Physically Abused: No    Sexually Abused: No  Review of Systems  Skin:  Positive for itching.  All other systems reviewed and are negative.       Objective    BP (!) 149/97   Pulse 80   Ht 5' 4 (1.626 m)   Wt 200 lb (90.7 kg)   LMP 02/29/2012 (Exact Date)   SpO2 95%   BMI 34.33 kg/m   Physical Exam Vitals and nursing note reviewed.  Constitutional:      General: She is not in acute distress.    Appearance: She is obese.  Cardiovascular:     Rate and Rhythm: Normal rate and regular rhythm.  Pulmonary:     Effort: Pulmonary effort is normal.     Breath sounds: Normal breath sounds.  Abdominal:     Palpations: Abdomen is soft.     Tenderness: There is no abdominal tenderness.  Neurological:     General: No focal deficit present.     Mental Status: She is alert and oriented to person, place, and time.         Assessment & Plan:  1. Pruritus (Primary) Skin care discussed - hydrOXYzine  (ATARAX ) 10 MG tablet; Take 1 tablet (10 mg total) by mouth 3 (three) times daily as needed.  Dispense: 90 tablet; Refill: 0  2. Perennial allergic rhinitis  - fluticasone  (FLONASE ) 50 MCG/ACT nasal spray; Place 2 sprays into both nostrils daily.  Dispense: 16 g; Refill: 1  3. Essential hypertension Slightly elevated readings. Continue   4. Smoker Discussed reduction/cessation     Return in about 2 months (around 01/16/2024) for follow up.   Tanda Raguel SQUIBB, MD

## 2023-11-16 ENCOUNTER — Encounter: Payer: Self-pay | Admitting: Family Medicine

## 2023-11-24 ENCOUNTER — Encounter: Payer: Self-pay | Admitting: Gastroenterology

## 2024-01-16 ENCOUNTER — Ambulatory Visit: Admitting: Family Medicine

## 2024-02-05 ENCOUNTER — Other Ambulatory Visit: Payer: Self-pay | Admitting: Family Medicine

## 2024-02-05 DIAGNOSIS — L299 Pruritus, unspecified: Secondary | ICD-10-CM

## 2024-02-11 ENCOUNTER — Other Ambulatory Visit: Payer: Self-pay | Admitting: Family Medicine

## 2024-02-11 DIAGNOSIS — L299 Pruritus, unspecified: Secondary | ICD-10-CM
